# Patient Record
Sex: Female | Born: 1960
Health system: Southern US, Community
[De-identification: ages and names within clinical notes are randomized; demographics above are authoritative.]

## PROBLEM LIST (undated history)

## (undated) DIAGNOSIS — D500 Iron deficiency anemia secondary to blood loss (chronic): Secondary | ICD-10-CM

## (undated) DIAGNOSIS — K219 Gastro-esophageal reflux disease without esophagitis: Secondary | ICD-10-CM

## (undated) DIAGNOSIS — Z9289 Personal history of other medical treatment: Secondary | ICD-10-CM

## (undated) DIAGNOSIS — Z862 Personal history of diseases of the blood and blood-forming organs and certain disorders involving the immune mechanism: Secondary | ICD-10-CM

## (undated) DIAGNOSIS — M1990 Unspecified osteoarthritis, unspecified site: Secondary | ICD-10-CM

## (undated) DIAGNOSIS — R0602 Shortness of breath: Secondary | ICD-10-CM

## (undated) DIAGNOSIS — D649 Anemia, unspecified: Secondary | ICD-10-CM

## (undated) HISTORY — PX: VAGINAL HYSTERECTOMY: SUR661

## (undated) SURGERY — Surgical Case
Anesthesia: *Unknown

---

## 1999-10-22 ENCOUNTER — Encounter: Payer: Self-pay | Admitting: Infectious Diseases

## 1999-10-22 ENCOUNTER — Ambulatory Visit (HOSPITAL_COMMUNITY): Admission: RE | Admit: 1999-10-22 | Discharge: 1999-10-22 | Payer: Self-pay | Admitting: Infectious Diseases

## 2001-12-02 ENCOUNTER — Ambulatory Visit (HOSPITAL_COMMUNITY): Admission: RE | Admit: 2001-12-02 | Discharge: 2001-12-02 | Payer: Self-pay | Admitting: Family Medicine

## 2001-12-02 ENCOUNTER — Encounter: Payer: Self-pay | Admitting: Family Medicine

## 2002-01-22 ENCOUNTER — Emergency Department (HOSPITAL_COMMUNITY): Admission: EM | Admit: 2002-01-22 | Discharge: 2002-01-22 | Payer: Self-pay | Admitting: *Deleted

## 2002-01-22 ENCOUNTER — Encounter: Payer: Self-pay | Admitting: *Deleted

## 2004-02-04 ENCOUNTER — Encounter: Admission: RE | Admit: 2004-02-04 | Discharge: 2004-02-04 | Payer: Self-pay | Admitting: Internal Medicine

## 2004-02-15 ENCOUNTER — Emergency Department (HOSPITAL_COMMUNITY): Admission: EM | Admit: 2004-02-15 | Discharge: 2004-02-15 | Payer: Self-pay | Admitting: Emergency Medicine

## 2004-08-06 HISTORY — PX: CERVICAL CONIZATION W/BX: SHX1330

## 2004-08-21 ENCOUNTER — Encounter (INDEPENDENT_AMBULATORY_CARE_PROVIDER_SITE_OTHER): Payer: Self-pay | Admitting: Specialist

## 2004-08-21 ENCOUNTER — Ambulatory Visit (HOSPITAL_COMMUNITY): Admission: RE | Admit: 2004-08-21 | Discharge: 2004-08-21 | Payer: Self-pay | Admitting: Obstetrics

## 2005-02-16 ENCOUNTER — Encounter: Admission: RE | Admit: 2005-02-16 | Discharge: 2005-02-16 | Payer: Self-pay | Admitting: Internal Medicine

## 2005-02-20 ENCOUNTER — Encounter: Admission: RE | Admit: 2005-02-20 | Discharge: 2005-02-20 | Payer: Self-pay | Admitting: Internal Medicine

## 2005-11-30 ENCOUNTER — Emergency Department (HOSPITAL_COMMUNITY): Admission: EM | Admit: 2005-11-30 | Discharge: 2005-11-30 | Payer: Self-pay | Admitting: *Deleted

## 2006-02-18 ENCOUNTER — Encounter: Admission: RE | Admit: 2006-02-18 | Discharge: 2006-02-18 | Payer: Self-pay | Admitting: Internal Medicine

## 2007-02-24 ENCOUNTER — Encounter: Admission: RE | Admit: 2007-02-24 | Discharge: 2007-02-24 | Payer: Self-pay | Admitting: Internal Medicine

## 2007-08-16 ENCOUNTER — Ambulatory Visit (HOSPITAL_COMMUNITY): Admission: RE | Admit: 2007-08-16 | Discharge: 2007-08-16 | Payer: Self-pay | Admitting: Cardiology

## 2008-02-28 ENCOUNTER — Encounter: Admission: RE | Admit: 2008-02-28 | Discharge: 2008-02-28 | Payer: Self-pay | Admitting: Internal Medicine

## 2008-07-30 ENCOUNTER — Emergency Department (HOSPITAL_COMMUNITY): Admission: EM | Admit: 2008-07-30 | Discharge: 2008-07-30 | Payer: Self-pay | Admitting: Family Medicine

## 2008-09-19 ENCOUNTER — Emergency Department (HOSPITAL_COMMUNITY): Admission: EM | Admit: 2008-09-19 | Discharge: 2008-09-19 | Payer: Self-pay | Admitting: Family Medicine

## 2009-03-07 ENCOUNTER — Encounter: Admission: RE | Admit: 2009-03-07 | Discharge: 2009-03-07 | Payer: Self-pay | Admitting: Internal Medicine

## 2010-02-17 ENCOUNTER — Emergency Department (HOSPITAL_COMMUNITY): Admission: EM | Admit: 2010-02-17 | Discharge: 2010-02-17 | Payer: Self-pay | Admitting: Emergency Medicine

## 2010-02-28 ENCOUNTER — Ambulatory Visit (HOSPITAL_COMMUNITY): Admission: RE | Admit: 2010-02-28 | Discharge: 2010-02-28 | Payer: Self-pay | Admitting: Gastroenterology

## 2010-03-11 ENCOUNTER — Encounter: Admission: RE | Admit: 2010-03-11 | Discharge: 2010-03-11 | Payer: Self-pay | Admitting: Internal Medicine

## 2010-06-29 ENCOUNTER — Encounter: Payer: Self-pay | Admitting: Internal Medicine

## 2010-08-18 ENCOUNTER — Other Ambulatory Visit: Payer: Self-pay | Admitting: Infectious Diseases

## 2010-08-18 ENCOUNTER — Ambulatory Visit (HOSPITAL_COMMUNITY)
Admission: RE | Admit: 2010-08-18 | Discharge: 2010-08-18 | Disposition: A | Discharge status: Home / Self Care | Payer: Self-pay | Source: Ambulatory Visit | Attending: Infectious Diseases | Admitting: Infectious Diseases

## 2010-08-18 DIAGNOSIS — R570 Cardiogenic shock: Secondary | ICD-10-CM | POA: Insufficient documentation

## 2010-08-18 DIAGNOSIS — R7611 Nonspecific reaction to tuberculin skin test without active tuberculosis: Secondary | ICD-10-CM

## 2010-08-21 LAB — POCT RAPID STREP A (OFFICE): Streptococcus, Group A Screen (Direct): NEGATIVE

## 2010-09-17 LAB — POCT RAPID STREP A (OFFICE): Streptococcus, Group A Screen (Direct): NEGATIVE

## 2010-10-24 NOTE — Op Note (Signed)
NAMESYBOL, MORRE              ACCOUNT NO.:  [id number]   MEDICAL RECORD NO.:  [id number]          PATIENT TYPE:  AMB   LOCATION:  SDC                           FACILITY:  [facility]   PHYSICIAN:  Kathreen Cosier, M.D.DATE OF BIRTH:  Mar 19, 1961   DATE OF PROCEDURE:  08/21/2004  DATE OF DISCHARGE:                                 OPERATIVE REPORT   PREOPERATIVE DIAGNOSIS:  Severe dysplasia of the cervix.   POSTOPERATIVE DIAGNOSIS:  Severe dysplasia of the cervix.   PROCEDURE:  Cervical conization.   DESCRIPTION OF PROCEDURE:  Under monitored anesthesia with the patient in  the lithotomy position, the perineum and vagina were prepped and draped.  The bladder was emptied with a straight catheter.   Bimanual exam revealed a normal uterus.  A weighted speculum was placed in  the vagina.  The cervix was stained with Lugol's solution, and there was a  nonstaining area on the posterior lip of the cervix.  A hemostatic suture  was placed at 3 and 9 o'clock on the lateral aspect of the cervix, and then  a cold knife cone done in the usual manner.  Hemostasis was achieved with  sutures placed around the cervix.   The patient tolerated the procedure well.  Blood loss was 100 cc.      BAM/MEDQ  D:  08/21/2004  T:  08/21/2004  Job:  [id number]

## 2011-02-05 ENCOUNTER — Other Ambulatory Visit: Payer: Self-pay | Admitting: Internal Medicine

## 2011-02-05 DIAGNOSIS — Z1231 Encounter for screening mammogram for malignant neoplasm of breast: Secondary | ICD-10-CM

## 2011-03-19 ENCOUNTER — Ambulatory Visit
Admission: RE | Admit: 2011-03-19 | Discharge: 2011-03-19 | Disposition: A | Discharge status: Home / Self Care | Payer: 59 | Source: Ambulatory Visit | Attending: Internal Medicine | Admitting: Internal Medicine

## 2011-03-19 DIAGNOSIS — Z1231 Encounter for screening mammogram for malignant neoplasm of breast: Secondary | ICD-10-CM

## 2011-03-26 ENCOUNTER — Emergency Department (HOSPITAL_COMMUNITY)
Admission: EM | Admit: 2011-03-26 | Discharge: 2011-03-26 | Disposition: A | Discharge status: Home / Self Care | Payer: PRIVATE HEALTH INSURANCE | Attending: Emergency Medicine | Admitting: Emergency Medicine

## 2011-03-26 DIAGNOSIS — T2610XA Burn of cornea and conjunctival sac, unspecified eye, initial encounter: Secondary | ICD-10-CM | POA: Insufficient documentation

## 2011-03-26 DIAGNOSIS — IMO0002 Reserved for concepts with insufficient information to code with codable children: Secondary | ICD-10-CM | POA: Insufficient documentation

## 2011-06-03 ENCOUNTER — Encounter: Payer: Self-pay | Admitting: Emergency Medicine

## 2011-06-03 ENCOUNTER — Emergency Department (INDEPENDENT_AMBULATORY_CARE_PROVIDER_SITE_OTHER): Payer: 59

## 2011-06-03 ENCOUNTER — Emergency Department (HOSPITAL_COMMUNITY)
Admission: EM | Admit: 2011-06-03 | Discharge: 2011-06-03 | Disposition: A | Discharge status: Nursing Home (Includes ICF) | Payer: 59 | Source: Home / Self Care

## 2011-06-03 ENCOUNTER — Encounter (HOSPITAL_COMMUNITY): Payer: Self-pay | Admitting: *Deleted

## 2011-06-03 ENCOUNTER — Encounter (HOSPITAL_COMMUNITY): Payer: Self-pay | Admitting: Physician Assistant

## 2011-06-03 ENCOUNTER — Encounter (HOSPITAL_COMMUNITY): Payer: Self-pay | Admitting: Emergency Medicine

## 2011-06-03 ENCOUNTER — Other Ambulatory Visit: Payer: Self-pay

## 2011-06-03 ENCOUNTER — Inpatient Hospital Stay (HOSPITAL_COMMUNITY)
Admission: EM | Admit: 2011-06-03 | Discharge: 2011-06-06 | DRG: 194 | Disposition: A | Discharge status: Home / Self Care | Payer: 59 | Source: Ambulatory Visit | Attending: Internal Medicine | Admitting: Internal Medicine

## 2011-06-03 DIAGNOSIS — J129 Viral pneumonia, unspecified: Secondary | ICD-10-CM

## 2011-06-03 DIAGNOSIS — J1100 Influenza due to unidentified influenza virus with unspecified type of pneumonia: Principal | ICD-10-CM | POA: Diagnosis present

## 2011-06-03 DIAGNOSIS — J069 Acute upper respiratory infection, unspecified: Secondary | ICD-10-CM

## 2011-06-03 DIAGNOSIS — R0602 Shortness of breath: Secondary | ICD-10-CM

## 2011-06-03 DIAGNOSIS — R509 Fever, unspecified: Secondary | ICD-10-CM

## 2011-06-03 DIAGNOSIS — Z6841 Body Mass Index (BMI) 40.0 and over, adult: Secondary | ICD-10-CM

## 2011-06-03 DIAGNOSIS — E669 Obesity, unspecified: Secondary | ICD-10-CM | POA: Diagnosis present

## 2011-06-03 DIAGNOSIS — R0902 Hypoxemia: Secondary | ICD-10-CM

## 2011-06-03 HISTORY — DX: Shortness of breath: R06.02

## 2011-06-03 LAB — CBC
HCT: 36.6 % (ref 36.0–46.0)
HCT: 37.3 % (ref 36.0–46.0)
Hemoglobin: 11.9 g/dL — ABNORMAL LOW (ref 12.0–15.0)
Hemoglobin: 12.3 g/dL (ref 12.0–15.0)
MCH: 29.8 pg (ref 26.0–34.0)
MCH: 30.2 pg (ref 26.0–34.0)
MCHC: 32.5 g/dL (ref 30.0–36.0)
MCHC: 33 g/dL (ref 30.0–36.0)
MCV: 91.5 fL (ref 78.0–100.0)
MCV: 91.6 fL (ref 78.0–100.0)
Platelets: 184 10*3/uL (ref 150–400)
Platelets: 213 10*3/uL (ref 150–400)
RBC: 4 MIL/uL (ref 3.87–5.11)
RBC: 4.07 MIL/uL (ref 3.87–5.11)
RDW: 13.8 % (ref 11.5–15.5)
RDW: 14 % (ref 11.5–15.5)
WBC: 6.5 10*3/uL (ref 4.0–10.5)
WBC: 6.8 10*3/uL (ref 4.0–10.5)

## 2011-06-03 LAB — CREATININE, SERUM
Creatinine, Ser: 0.67 mg/dL (ref 0.50–1.10)
GFR calc Af Amer: >90 mL/min (ref >90)
GFR calc non Af Amer: >90 mL/min (ref >90)

## 2011-06-03 LAB — POCT I-STAT, CHEM 8
BUN: 11 mg/dL (ref 6–23)
Calcium, Ion: 1.09 mmol/L — ABNORMAL LOW (ref 1.12–1.32)
Chloride: 97 mEq/L (ref 96–112)
Creatinine, Ser: 0.9 mg/dL (ref 0.50–1.10)
Glucose, Bld: 114 mg/dL — ABNORMAL HIGH (ref 70–99)
HCT: 39 % (ref 36.0–46.0)
Hemoglobin: 13.3 g/dL (ref 12.0–15.0)
Potassium: 3.4 mEq/L — ABNORMAL LOW (ref 3.5–5.1)
Sodium: 140 mEq/L (ref 135–145)
TCO2: 32 mmol/L (ref 0–100)

## 2011-06-03 LAB — DIFFERENTIAL
Basophils Absolute: 0 10*3/uL (ref 0.0–0.1)
Basophils Relative: 0 % (ref 0–1)
Eosinophils Absolute: 0 10*3/uL (ref 0.0–0.7)
Eosinophils Relative: 0 % (ref 0–5)
Lymphocytes Relative: 11 % — ABNORMAL LOW (ref 12–46)
Lymphs Abs: 0.7 10*3/uL (ref 0.7–4.0)
Monocytes Absolute: 1.2 10*3/uL — ABNORMAL HIGH (ref 0.1–1.0)
Monocytes Relative: 18 % — ABNORMAL HIGH (ref 3–12)
Neutro Abs: 4.5 10*3/uL (ref 1.7–7.7)
Neutrophils Relative %: 70 % (ref 43–77)

## 2011-06-03 LAB — POCT I-STAT TROPONIN I: Troponin i, poc: 0 ng/mL (ref 0.00–0.08)

## 2011-06-03 MED ORDER — MAGNESIUM SULFATE 40 MG/ML IJ SOLN: 2.0000 g | INTRAMUSCULAR | Status: DC

## 2011-06-03 MED ORDER — ACETAMINOPHEN 325 MG PO TABS
ORAL_TABLET | ORAL | Status: AC
  Filled 2011-06-03: qty 2

## 2011-06-03 MED ORDER — ALBUTEROL SULFATE (5 MG/ML) 0.5% IN NEBU
5.0000 mg | INHALATION_SOLUTION | Freq: Once | RESPIRATORY_TRACT | Status: AC
  Administered 2011-06-03: 5 mg via RESPIRATORY_TRACT

## 2011-06-03 MED ORDER — ACETAMINOPHEN 650 MG RE SUPP: 650.0000 mg | Freq: Four times a day (QID) | RECTAL | Status: DC | PRN

## 2011-06-03 MED ORDER — ALBUTEROL SULFATE (5 MG/ML) 0.5% IN NEBU
5.0000 mg | INHALATION_SOLUTION | Freq: Once | RESPIRATORY_TRACT | Status: AC
  Administered 2011-06-03: 5 mg via RESPIRATORY_TRACT
  Filled 2011-06-03: qty 1

## 2011-06-03 MED ORDER — IPRATROPIUM BROMIDE 0.02 % IN SOLN
0.5000 mg | Freq: Once | RESPIRATORY_TRACT | Status: AC
  Administered 2011-06-03: 0.5 mg via RESPIRATORY_TRACT

## 2011-06-03 MED ORDER — METHYLPREDNISOLONE SODIUM SUCC 125 MG IJ SOLR
125.0000 mg | Freq: Once | INTRAMUSCULAR | Status: AC
  Administered 2011-06-03: 125 mg via INTRAVENOUS
  Filled 2011-06-03: qty 2

## 2011-06-03 MED ORDER — ONDANSETRON HCL 4 MG PO TABS: 4.0000 mg | ORAL_TABLET | Freq: Four times a day (QID) | ORAL | Status: DC | PRN

## 2011-06-03 MED ORDER — ACETAMINOPHEN 325 MG PO TABS: 650.0000 mg | ORAL_TABLET | Freq: Four times a day (QID) | ORAL | Status: DC | PRN

## 2011-06-03 MED ORDER — SODIUM CHLORIDE 0.9 % IV SOLN: 250.0000 mL | INTRAVENOUS | Status: DC | PRN

## 2011-06-03 MED ORDER — MORPHINE SULFATE 2 MG/ML IJ SOLN: 2.0000 mg | INTRAMUSCULAR | Status: DC | PRN

## 2011-06-03 MED ORDER — ALBUTEROL SULFATE (5 MG/ML) 0.5% IN NEBU
INHALATION_SOLUTION | RESPIRATORY_TRACT | Status: AC
  Filled 2011-06-03: qty 1

## 2011-06-03 MED ORDER — ONDANSETRON HCL 4 MG/2ML IJ SOLN
INTRAMUSCULAR | Status: AC
  Filled 2011-06-03: qty 2

## 2011-06-03 MED ORDER — ADULT MULTIVITAMIN W/MINERALS CH
1.0000 | ORAL_TABLET | Freq: Every day | ORAL | Status: DC
  Administered 2011-06-04 – 2011-06-06 (×3): 1 via ORAL
  Filled 2011-06-03 (×3): qty 1

## 2011-06-03 MED ORDER — POLYETHYLENE GLYCOL 3350 17 G PO PACK
17.0000 g | PACK | Freq: Every day | ORAL | Status: DC | PRN
  Filled 2011-06-03: qty 1

## 2011-06-03 MED ORDER — ONDANSETRON HCL 4 MG/2ML IJ SOLN: 4.0000 mg | Freq: Four times a day (QID) | INTRAMUSCULAR | Status: DC | PRN

## 2011-06-03 MED ORDER — SODIUM CHLORIDE 0.9 % IJ SOLN: 3.0000 mL | INTRAMUSCULAR | Status: DC | PRN

## 2011-06-03 MED ORDER — SODIUM CHLORIDE 0.9 % IJ SOLN
3.0000 mL | Freq: Two times a day (BID) | INTRAMUSCULAR | Status: DC
  Administered 2011-06-04 – 2011-06-06 (×4): 3 mL via INTRAVENOUS

## 2011-06-03 MED ORDER — SODIUM CHLORIDE 0.9 % IV SOLN
INTRAVENOUS | Status: DC
  Administered 2011-06-03 (×2): via INTRAVENOUS

## 2011-06-03 MED ORDER — ENOXAPARIN SODIUM 40 MG/0.4ML ~~LOC~~ SOLN
40.0000 mg | SUBCUTANEOUS | Status: DC
  Administered 2011-06-03 – 2011-06-05 (×3): 40 mg via SUBCUTANEOUS
  Filled 2011-06-03 (×4): qty 0.4

## 2011-06-03 MED ORDER — SODIUM CHLORIDE 0.9 % IV BOLUS (SEPSIS)
1000.0000 mL | Freq: Once | INTRAVENOUS | Status: AC
  Administered 2011-06-03: 1000 mL via INTRAVENOUS

## 2011-06-03 MED ORDER — MOXIFLOXACIN HCL IN NACL 400 MG/250ML IV SOLN
400.0000 mg | INTRAVENOUS | Status: DC
  Administered 2011-06-03: 400 mg via INTRAVENOUS
  Filled 2011-06-03 (×2): qty 250

## 2011-06-03 MED ORDER — ALBUTEROL SULFATE (5 MG/ML) 0.5% IN NEBU
2.5000 mg | INHALATION_SOLUTION | Freq: Four times a day (QID) | RESPIRATORY_TRACT | Status: DC
  Administered 2011-06-03: 2.5 mg via RESPIRATORY_TRACT
  Filled 2011-06-03: qty 0.5

## 2011-06-03 MED ORDER — POTASSIUM CHLORIDE CRYS ER 20 MEQ PO TBCR: 20.0000 meq | EXTENDED_RELEASE_TABLET | Freq: Once | ORAL | Status: DC

## 2011-06-03 MED ORDER — BENZONATATE 100 MG PO CAPS
100.0000 mg | ORAL_CAPSULE | Freq: Once | ORAL | Status: AC
  Administered 2011-06-03: 100 mg via ORAL
  Filled 2011-06-03: qty 1

## 2011-06-03 MED ORDER — ACETAMINOPHEN 325 MG PO TABS
650.0000 mg | ORAL_TABLET | Freq: Once | ORAL | Status: AC
  Administered 2011-06-03: 650 mg via ORAL

## 2011-06-03 MED ORDER — POTASSIUM CHLORIDE CRYS ER 20 MEQ PO TBCR
40.0000 meq | EXTENDED_RELEASE_TABLET | Freq: Once | ORAL | Status: AC
  Administered 2011-06-03: 40 meq via ORAL
  Filled 2011-06-03: qty 2

## 2011-06-03 MED ORDER — OXYCODONE HCL 5 MG PO TABS: 5.0000 mg | ORAL_TABLET | ORAL | Status: DC | PRN

## 2011-06-03 MED ORDER — ONDANSETRON HCL 4 MG/2ML IJ SOLN
8.0000 mg | Freq: Once | INTRAMUSCULAR | Status: AC
  Administered 2011-06-03: 8 mg via INTRAVENOUS

## 2011-06-03 MED ORDER — IPRATROPIUM BROMIDE 0.02 % IN SOLN
0.5000 mg | Freq: Once | RESPIRATORY_TRACT | Status: AC
  Administered 2011-06-03: 0.5 mg via RESPIRATORY_TRACT
  Filled 2011-06-03: qty 2.5

## 2011-06-03 MED ORDER — ALBUTEROL SULFATE (5 MG/ML) 0.5% IN NEBU: 2.5000 mg | INHALATION_SOLUTION | RESPIRATORY_TRACT | Status: DC | PRN

## 2011-06-03 NOTE — ED Notes (Signed)
Pt transferred from urgent care for further eval of shortness of breath and low pulse ox.

## 2011-06-03 NOTE — ED Notes (Signed)
Pt here with c/o cough with clear phlegm,chest congestion and discomfort and fever on admit 103.2.pt states sx started on Monday.no n/v/d reported.pt also c/o allergy sx watery eyes,sore throat.no sob or wheezing noted

## 2011-06-03 NOTE — ED Notes (Signed)
3013-01 READY

## 2011-06-03 NOTE — ED Provider Notes (Signed)
History     CSN: [id number]  Arrival date & time 06/03/11  1047   First MD Initiated Contact with Patient 06/03/11 1137      Chief Complaint  Patient presents with  . Respiratory Distress  . Cough    (Consider location/radiation/quality/duration/timing/severity/associated sxs/prior treatment) HPI Comments: Patient transferred to the emergency department from urgent care with diagnosis of viral pneumonitis and hypoxia.  Patient came into the emergency department febrile with O2 saturations in the low 90s.  The patient states she's had upper respiratory type symptoms for the last 3 days including cough, wheezing, congestion, and fever. and she began having difficulty breathing today.  The history is provided by the patient.    History reviewed. No pertinent past medical history.  Past Surgical History  Procedure Date  . Abdominal hysterectomy   . Abdominal hysterectomy     History reviewed. No pertinent family history.  History  Substance Use Topics  . Smoking status: Never Smoker   . Smokeless tobacco: Never Used  . Alcohol Use: Yes     Occassional    OB History    Grav Para Term Preterm Abortions TAB SAB Ect Mult Living                  Review of Systems  Constitutional: Positive for fever, chills and fatigue.  HENT: Positive for congestion, sore throat and rhinorrhea. Negative for facial swelling, drooling, mouth sores, trouble swallowing, neck pain, neck stiffness, dental problem, voice change, tinnitus and ear discharge.   Eyes: Negative for visual disturbance.  Respiratory: Positive for cough, chest tightness and shortness of breath.   Cardiovascular: Positive for chest pain. Negative for palpitations and leg swelling.  Gastrointestinal: Negative for nausea, vomiting and diarrhea.  Genitourinary: Negative for dysuria and urgency.  Musculoskeletal: Negative for back pain and gait problem.  Skin: Negative for pallor and rash.  Neurological: Negative for  syncope, weakness and light-headedness.  All other systems reviewed and are negative.    Allergies  Review of patient's allergies indicates no known allergies.  Home Medications   Current Outpatient Rx  Name Route Sig Dispense Refill  . DIPHENHYDRAMINE HCL 25 MG PO TABS Oral Take 25 mg by mouth every 6 (six) hours as needed. For allergies     . IBUPROFEN 200 MG PO TABS Oral Take 400 mg by mouth daily as needed. For pain     . VITAMIN D (ERGOCALCIFEROL) 50000 UNITS PO CAPS Oral Take 50,000 Units by mouth every 7 (seven) days. Take every Monday       BP 111/54  Pulse 83  Temp(Src) 101.4 F (38.6 C) (Oral)  Resp 20  SpO2 97%  Physical Exam  Nursing note and vitals reviewed. Constitutional: She is oriented to person, place, and time. She appears well-developed and well-nourished. No distress.  HENT:  Head: Normocephalic and atraumatic.  Right Ear: External ear normal.  Left Ear: External ear normal.  Nose: Nose normal.  Mouth/Throat: Oropharynx is clear and moist. No oropharyngeal exudate.  Eyes: EOM are normal.  Neck: Normal range of motion. Neck supple.  Cardiovascular: Normal rate, regular rhythm and normal heart sounds.   Pulmonary/Chest: Effort normal. No accessory muscle usage or stridor. Not tachypneic. No respiratory distress. She has wheezes. She has no rhonchi. She has no rales. She exhibits tenderness.  Abdominal: Soft. She exhibits no distension. There is no tenderness.  Musculoskeletal: Normal range of motion.  Lymphadenopathy:    She has no cervical adenopathy.  Neurological: She is  alert and oriented to person, place, and time.  Skin: Skin is warm and dry. She is not diaphoretic.    ED Course  Procedures (including critical care time)  Labs Reviewed  CBC - Abnormal; Notable for the following:    Hemoglobin 11.9 (*)    All other components within normal limits  DIFFERENTIAL - Abnormal; Notable for the following:    Lymphocytes Relative 11 (*)     Monocytes Relative 18 (*)    Monocytes Absolute 1.2 (*)    All other components within normal limits  POCT I-STAT, CHEM 8 - Abnormal; Notable for the following:    Potassium 3.4 (*)    Glucose, Bld 114 (*)    Calcium, Ion 1.09 (*)    All other components within normal limits  POCT I-STAT TROPONIN I  I-STAT, CHEM 8  I-STAT TROPONIN I   Dg Chest 2 View  06/03/2011  *RADIOLOGY REPORT*  Clinical Data: Cough.  Fever.  CHEST - 2 VIEW  Comparison: 08/18/2010  Findings: There is bronchial thickening.  There is abnormal interstitial density.  No dense consolidation or collapse.  No effusions.  Findings could go along with bronchitis and/or viral pneumonitis.  No evidence of mass.  No significant bony finding. Heart size is normal.  Mediastinal shadows are normal.  IMPRESSION: Bronchial and interstitial prominence suggesting bronchitis and/or viral pneumonitis.  No dense consolidation or collapse.  Original Report Authenticated By: Thomasenia Sales, M.D.     No diagnosis found.  Patient given 125 IV Solu-Medrol, fluids, and a nebulizer treatment while in the emergency department.  Patient is still audibly wheezing on physical exam and is unable to keep her oxygen saturation above 90 when the nasal cannula was removed.  Note that patient is normally non-oxygen at home and is taking 6 L to keep her above 90 while in the ED.  Internal medicine has been paged to admit this patient for the diagnosis of hypoxia and URI Pt has no medical hx in chart and states she has seasonal allergies.  Nurse reassessing vital signs MDM  Hypoxia & bronchitis vs viral pneumonitis.         [place], [place] 06/03/11 [phone]

## 2011-06-03 NOTE — ED Provider Notes (Signed)
Medical screening examination/treatment/procedure(s) were performed by non-physician practitioner and as supervising physician I was immediately available for consultation/collaboration.  Raynald Blend, MD 06/03/11 1253

## 2011-06-03 NOTE — ED Notes (Signed)
1610-96 ready  Johnetta NS notified/msg to Zina

## 2011-06-03 NOTE — H&P (Signed)
PCP:   [name], MD, MD   Chief Complaint:  Cough and shortness of breath  HPI: Patient is a 50 year old African American female with essentially no past medical history who for the past 3 days has been complaining of productive cough with whitish sputum, increasing shortness of breath and dyspnea on exertion, wheezing, subjective fever and sore throat. Her symptoms persisted and so she went to urgent care today. A chest x-ray done noted viral pneumonitis and she was noted to be hypoxic. She's been transferred over to [facility] emergency room where after several breathing treatments and supportive oxygen, her oxygen saturations increased to about 95%, but requiring 6 L. On room air her oxygen saturations were between the 70s to 80s. In the emergency room she was documented to have fevers ranging from 101-103.8.  I saw the patient, she was feeling better. She said her breathing was easier as long she is on supplemental oxygen. She denied any pain other than earlier throughout her chest whenever she coughed hard.  Review of Systems:  The patient denies any headache, vision changes, dysphagia, current chest pain, palpitations, abdominal pain, hematuria, dysuria, constipation, diarrhea, focal steady numbness, weakness or pain. Her only complaint earlier was shortness of breath, productive cough and wheezing although which have improved since she's been in the emergency room. Review systems otherwise negative.  Past Medical History: History reviewed. No pertinent past medical history. Past Surgical History  Procedure Date  . Abdominal hysterectomy   . Abdominal hysterectomy     Medications: Prior to Admission medications   Medication Sig Start Date End Date Taking? Authorizing Provider  diphenhydrAMINE (BENADRYL) 25 MG tablet Take 25 mg by mouth every 6 (six) hours as needed. For allergies    Yes Historical Provider, MD  ibuprofen (ADVIL,MOTRIN) 200 MG tablet Take 400 mg by mouth daily  as needed. For pain    Yes Historical Provider, MD  Vitamin D, Ergocalciferol, (DRISDOL) 50000 UNITS CAPS Take 50,000 Units by mouth every 7 (seven) days. Take every Monday    Yes Historical Provider, MD    Allergies:  No Known Allergies  Social History:  reports that she has never smoked. She has never used smokeless tobacco. She reports that she drinks alcohol-occasional glass of wine, but nothing heavy. She reports that she does not use illicit drugs. She lives at home with her husband and is normally active and able to fully participate in ADLs without any difficulty.  Family History: Have discussed with the patient and she states that the only thing that runs in her family COPD  Physical Exam: Filed Vitals:   06/03/11 1054 06/03/11 1143 06/03/11 1348 06/03/11 1605  BP:  111/54 114/62 121/66  Pulse:  83 84 88  Temp:      TempSrc:      Resp:  20 12   SpO2: 97% 97% 100% 100%   General: Alert and oriented x3, no apparent distress, looks older than stated age, fatigued HEENT: Thomas R., atraumatic, mucous her meds are dry Cardiovascular: Regular rate and rhythm, S1-S2 Lungs: Scattered rales Abdomen: Soft, nontender, nondistended, obese, positive bowel sounds Extremity: No clubbing cyanosis, trace pitting edema   Labs on Admission:   [facility] 06/03/11 1220  NA 140  K 3.4*  CL 97  CO2 --  GLUCOSE 114*  BUN 11  CREATININE 0.90  CALCIUM --  MG --  PHOS --    Basename 06/03/11 1220 06/03/11 1204  WBC -- 6.5  NEUTROABS -- 4.5  HGB 13.3  11.9*  HCT 39.0 36.6  MCV -- 91.5  PLT -- 184    Radiological Exams on Admission: Dg Chest 2 View 06/03/2011    IMPRESSION: Bronchial and interstitial prominence suggesting bronchitis and/or viral pneumonitis.  No dense consolidation or collapse.     Assessment/Plan Present on Admission:  .Hypoxemia: Suspect patient has the flu, and question if some viral pneumonia may be underlying as well. Continue oxygen and nebulizers. Have  started Avelox. Waiting for influenza PCR. Admit to hospital with continuous pulse ox. Medically stable.  .Fever: Likely secondary to flu. Awaiting PCR. Treat subjectively.  .Obesity  Jr Milliron K 06/03/2011, 4:48 PM

## 2011-06-03 NOTE — ED Provider Notes (Signed)
History     CSN: [id number]  Arrival date & time 06/03/11  0803   None     Chief Complaint  Patient presents with  . Influenza  . Cough  . Fever    (Consider location/radiation/quality/duration/timing/severity/associated sxs/prior treatment) HPI Comments: Onset 2 days ago of cough, watery eyes, nausea, sore throat and fever. Pt states her chest hurts from coughing. She denies vomiting or diarrhea. The cough is sometimes productive with clear phlegm. She denies myalgias, dyspnea or wheezing. She has no hx of asthma or COPD. Pt states her 2 sons recently were ill with same symptoms which she thought was due to allergies. She has been taking Advil (last dose 2 days ago), and Benadryl. She also took 1 Amoxicillin which she had from a previous prescription.    History reviewed. No pertinent past medical history.  Past Surgical History  Procedure Date  . Abdominal hysterectomy   . Abdominal hysterectomy     No family history on file.  History  Substance Use Topics  . Smoking status: Never Smoker   . Smokeless tobacco: Never Used  . Alcohol Use: Yes     Occassional    OB History    Grav Para Term Preterm Abortions TAB SAB Ect Mult Living                  Review of Systems  Constitutional: Positive for fever and chills.  HENT: Positive for sore throat. Negative for ear pain, congestion and rhinorrhea.   Respiratory: Positive for cough. Negative for shortness of breath and wheezing.   Cardiovascular: Negative for chest pain.  Gastrointestinal: Positive for nausea. Negative for vomiting, abdominal pain and diarrhea.    Allergies  Review of patient's allergies indicates no known allergies.  Home Medications   Current Outpatient Rx  Name Route Sig Dispense Refill  . DIPHENHYDRAMINE HCL 25 MG PO TABS Oral Take 25 mg by mouth every 6 (six) hours as needed. For allergies     . IBUPROFEN 200 MG PO TABS Oral Take 400 mg by mouth daily as needed. For pain     . VITAMIN D  (ERGOCALCIFEROL) 50000 UNITS PO CAPS Oral Take 50,000 Units by mouth every 7 (seven) days. Take every Monday       BP 115/65  Pulse 90  Temp(Src) 102.2 F (39 C) (Oral)  Resp 17  SpO2 92%  Physical Exam  Nursing note and vitals reviewed. Constitutional: She appears well-developed and well-nourished. No distress.  HENT:  Head: Normocephalic and atraumatic.  Right Ear: Tympanic membrane, external ear and ear canal normal.  Left Ear: Tympanic membrane, external ear and ear canal normal.  Nose: Nose normal.  Mouth/Throat: Uvula is midline, oropharynx is clear and moist and mucous membranes are normal. No oropharyngeal exudate, posterior oropharyngeal edema or posterior oropharyngeal erythema.  Neck: Neck supple.  Cardiovascular: Normal rate, regular rhythm and normal heart sounds.   Pulmonary/Chest: Effort normal and breath sounds normal. No respiratory distress.  Lymphadenopathy:    She has no cervical adenopathy.  Neurological: She is alert.  Skin: Skin is warm and dry.  Psychiatric: She has a normal mood and affect.    ED Course  Procedures (including critical care time)  Labs Reviewed - No data to display Dg Chest 2 View  06/03/2011  *RADIOLOGY REPORT*  Clinical Data: Cough.  Fever.  CHEST - 2 VIEW  Comparison: 08/18/2010  Findings: There is bronchial thickening.  There is abnormal interstitial density.  No dense consolidation or  collapse.  No effusions.  Findings could go along with bronchitis and/or viral pneumonitis.  No evidence of mass.  No significant bony finding. Heart size is normal.  Mediastinal shadows are normal.  IMPRESSION: Bronchial and interstitial prominence suggesting bronchitis and/or viral pneumonitis.  No dense consolidation or collapse.  Original Report Authenticated By: Thomasenia Sales, M.D.     1. Viral pneumonitis   2. Hypoxia       MDM  Pt transferred to [facility]. Hypoxemia - viral pneumonitis on xray and poor resp effort. O2 sats noted to improve  when pt instructed to take deep  Breaths, but then begin to decrease again. No improvement after NMT.         Melody [place]as, [place] 06/03/11 1201

## 2011-06-03 NOTE — ED Notes (Signed)
1610-96 READY

## 2011-06-03 NOTE — ED Notes (Signed)
Pt coming from Urgent care where she went to be treated for a cough and nausea.  At urgent care pt was breathing treatment, tylenol, and zofran. Pt sent from urgent care to be admitted.  ems report that pt desats with any movement and is currently on 6L o2.

## 2011-06-03 NOTE — ED Notes (Signed)
PT POST BREATHING TREATMENT SATS 97% R/A BUT DESATS IN LOW 90'S AT REST.SATS INCREASED AFTER TOLD TO TAKE DEEP BREATHS.TEMP RECHECK 101.8

## 2011-06-04 LAB — INFLUENZA PANEL BY PCR (TYPE A & B)
H1N1 flu by pcr: NOT DETECTED
Influenza A By PCR: POSITIVE — AB
Influenza B By PCR: NEGATIVE

## 2011-06-04 LAB — BASIC METABOLIC PANEL
BUN: 13 mg/dL (ref 6–23)
CO2: 31 mEq/L (ref 19–32)
Calcium: 8.4 mg/dL (ref 8.4–10.5)
Chloride: 103 mEq/L (ref 96–112)
Creatinine, Ser: 0.61 mg/dL (ref 0.50–1.10)
GFR calc Af Amer: >90 mL/min (ref >90)
GFR calc non Af Amer: >90 mL/min (ref >90)
Glucose, Bld: 95 mg/dL (ref 70–99)
Potassium: 3.8 mEq/L (ref 3.5–5.1)
Sodium: 141 mEq/L (ref 135–145)

## 2011-06-04 LAB — CBC
HCT: 36.5 % (ref 36.0–46.0)
Hemoglobin: 11.5 g/dL — ABNORMAL LOW (ref 12.0–15.0)
MCH: 29.3 pg (ref 26.0–34.0)
MCHC: 31.5 g/dL (ref 30.0–36.0)
MCV: 93.1 fL (ref 78.0–100.0)
Platelets: 217 10*3/uL (ref 150–400)
RBC: 3.92 MIL/uL (ref 3.87–5.11)
RDW: 14.2 % (ref 11.5–15.5)
WBC: 6.7 10*3/uL (ref 4.0–10.5)

## 2011-06-04 MED ORDER — OSELTAMIVIR PHOSPHATE 75 MG PO CAPS
75.0000 mg | ORAL_CAPSULE | Freq: Two times a day (BID) | ORAL | Status: DC
  Administered 2011-06-04 – 2011-06-06 (×5): 75 mg via ORAL
  Filled 2011-06-04 (×6): qty 1

## 2011-06-04 MED ORDER — PREDNISONE 50 MG PO TABS
50.0000 mg | ORAL_TABLET | Freq: Every day | ORAL | Status: DC
  Administered 2011-06-05 – 2011-06-06 (×2): 50 mg via ORAL
  Filled 2011-06-04 (×3): qty 1

## 2011-06-04 MED ORDER — ALBUTEROL SULFATE HFA 108 (90 BASE) MCG/ACT IN AERS
1.0000 | INHALATION_SPRAY | RESPIRATORY_TRACT | Status: DC | PRN
  Filled 2011-06-04: qty 6.7

## 2011-06-04 MED ORDER — ALBUTEROL SULFATE (5 MG/ML) 0.5% IN NEBU
2.5000 mg | INHALATION_SOLUTION | Freq: Three times a day (TID) | RESPIRATORY_TRACT | Status: DC
  Administered 2011-06-04: 2.5 mg via RESPIRATORY_TRACT

## 2011-06-04 NOTE — Progress Notes (Signed)
Pt admitted for cough and fever x 1 week. Pt repts that she was SOB but SOB is now "gone". Pt repts chest "discomfort" when she coughs that radiates from chest to stomach that is unchanged over the week that she has been sick. Pt lungs noted to be clear in the upper lobes with diminshed areas in the bases and fine crackles of the lower posterior bases. Pt on continuous pulse ox. Pt sats 93% 1 lpm. Pt noted to have an oxy sat level of 85% while bathing this AM on RA. Pt oxygen then decreased to 1 lpm and pt level has stayed 90-93%. Pt currently c/o now resp distress or cardiac distress and no s/sx of such. Pt repts nonprod cough now that was once productive. Pt repts that she feels "better" today. Pt repts decreased appetite since she has been sick and LBM 12/24. Pt denies nausea or vomiting. Pt repts voiding quantity sufficient without difficulty. Pt repts that her throat remains "sore".

## 2011-06-04 NOTE — ED Provider Notes (Signed)
Medical screening examination/treatment/procedure(s) were performed by non-physician practitioner and as supervising physician I was immediately available for consultation/collaboration.  Geoffery Lyons, MD 06/04/11 [phone]

## 2011-06-04 NOTE — Progress Notes (Signed)
Subjective/24 Hour Events: I have reviewed the admission H&P and all data in between. Patient reports feeling slightly better. Per nurse, still requiring O2 as evidenced by fact that her sats dropped to 88% off of O2 while trying to wash up. Evaluation positive for Influenza A. Last fever at 5pm yesterday.   Filed Vitals:   06/04/11 0700  BP: 121/57  Pulse: 83  Temp: 98.8 F (37.1 C)  Resp: 18     Exam: General: Alert and oriented x 3, no apparent distress Lungs: Ronchi bilateral lower lobes Heart: Regular rate and rhythm, III/VI holosystolic murmur. No rubs, gallops, or murmurs Abdomen: Soft, nontender, nondistended, positive bowel sounds, no masses Extremities: No lower extremity edema.  Labs: CBC    Component Value Date/Time   WBC 6.7 06/04/2011 0615   RBC 3.92 06/04/2011 0615   HGB 11.5* 06/04/2011 0615   HCT 36.5 06/04/2011 0615   PLT 217 06/04/2011 0615   MCV 93.1 06/04/2011 0615   MCH 29.3 06/04/2011 0615   MCHC 31.5 06/04/2011 0615   RDW 14.2 06/04/2011 0615   LYMPHSABS 0.7 06/03/2011 1204   MONOABS 1.2* 06/03/2011 1204   EOSABS 0.0 06/03/2011 1204   BASOSABS 0.0 06/03/2011 1204    BMET    Component Value Date/Time   NA 141 06/04/2011 0615   K 3.8 06/04/2011 0615   CL 103 06/04/2011 0615   CO2 31 06/04/2011 0615   GLUCOSE 95 06/04/2011 0615   BUN 13 06/04/2011 0615   CREATININE 0.61 06/04/2011 0615   CALCIUM 8.4 06/04/2011 0615   GFRNONAA >90 06/04/2011 0615   GFRAA >90 06/04/2011 0615   Flu PCR positive for Influenza A  Medications: Reviewed in [facility]   Impression/Plan: 50 year old otherwise healthy woman who presents for evaluation of shortness of breath, cough, and infectious symptoms found to be positive for influenza pneumonia. Patient is currently afebrile and hemodynamically stable although still requiring O2 with exertion.  Influenza: Discontinue Avelox Start Tamiflu Start high-dose prednisone burst with prednisone 50 mg by mouth  daily Albuterol inhaler as needed Follow-up blood cultures Attempt to wean off oxygen with the above  Fluid/Nutrition: - Hep lock IV - General diet  Prophylaxis: - Lovenox

## 2011-06-05 NOTE — Progress Notes (Signed)
Chaplain's Note:  Pt invited me into her room.  I provided emotional and spiritual support.  Pt is ready to feel better and go home.  We ended the visit with prayer.  Please page me if needed or requested.  06/05/11 1157  Clinical Encounter Type  Visited With Patient  Visit Type Spiritual support  Referral From Nurse  Spiritual Encounters  Spiritual Needs Prayer;Emotional  Stress Factors  Patient Stress Factors Family relationships;Health changes   [facility]  [phone]

## 2011-06-05 NOTE — Plan of Care (Signed)
Problem: Phase II Progression Outcomes Goal: Other Phase II Outcomes/Goals Outcome: Progressing Weaning 02 5l to 2l

## 2011-06-05 NOTE — Progress Notes (Signed)
Patient ID: Emily Holland, female   DOB: 04-25-1961, 50 y.o.   MRN: [id number] Subjective: No events overnight. Patient denies chest pain, shortness of breath, abdominal pain.   Objective:  Vital signs in last 24 hours:  Filed Vitals:   06/04/11 0700 06/04/11 2234 06/05/11 0628 06/05/11 0900  BP: 121/57 134/68 134/51   Pulse: 83 72 75   Temp: 98.8 F (37.1 C) 99.2 F (37.3 C) 98.3 F (36.8 C)   TempSrc:      Resp: 18 18 18    Height:    5\' 7"  (1.702 m)  Weight:    117.935 kg (260 lb)  SpO2: 95% 96% 98%     Intake/Output from previous day:   Intake/Output Summary (Last 24 hours) at 06/05/11 1725 Last data filed at 06/05/11 0655  Gross per 24 hour  Intake    600 ml  Output      0 ml  Net    600 ml    Physical Exam: General: Alert, awake, oriented x3, in no acute distress. HEENT: No bruits, no goiter. Moist mucous membranes, no scleral icterus, no conjunctival pallor. Heart: Regular rate and rhythm, S1/S2 +, no murmurs, rubs, gallops. Lungs: Clear to auscultation bilaterally. No wheezing, no rhonchi, no rales.  Abdomen: Soft, nontender, nondistended, positive bowel sounds. Extremities: No clubbing or cyanosis, no pitting edema,  positive pedal pulses. Neuro: Grossly nonfocal.  Lab Results:  Basic Metabolic Panel:    Component Value Date/Time   NA 141 06/04/2011 0615   K 3.8 06/04/2011 0615   CL 103 06/04/2011 0615   CO2 31 06/04/2011 0615   BUN 13 06/04/2011 0615   CREATININE 0.61 06/04/2011 0615   GLUCOSE 95 06/04/2011 0615   CALCIUM 8.4 06/04/2011 0615   CBC:    Component Value Date/Time   WBC 6.7 06/04/2011 0615   HGB 11.5* 06/04/2011 0615   HCT 36.5 06/04/2011 0615   PLT 217 06/04/2011 0615   MCV 93.1 06/04/2011 0615   NEUTROABS 4.5 06/03/2011 1204   LYMPHSABS 0.7 06/03/2011 1204   MONOABS 1.2* 06/03/2011 1204   EOSABS 0.0 06/03/2011 1204   BASOSABS 0.0 06/03/2011 1204      Lab 06/04/11 0615 06/03/11 2038 06/03/11 1220 06/03/11 1204  WBC  6.7 6.8 -- 6.5  HGB 11.5* 12.3 13.3 11.9*  HCT 36.5 37.3 39.0 36.6  PLT 217 213 -- 184  MCV 93.1 91.6 -- 91.5  MCH 29.3 30.2 -- 29.8  MCHC 31.5 33.0 -- 32.5  RDW 14.2 14.0 -- 13.8  LYMPHSABS -- -- -- 0.7  MONOABS -- -- -- 1.2*  EOSABS -- -- -- 0.0  BASOSABS -- -- -- 0.0  BANDABS -- -- -- --    Lab 06/04/11 0615 06/03/11 2038 06/03/11 1220  NA 141 -- 140  K 3.8 -- 3.4*  CL 103 -- 97  CO2 31 -- --  GLUCOSE 95 -- 114*  BUN 13 -- 11  CREATININE 0.61 0.67 0.90  CALCIUM 8.4 -- --  MG -- -- --   No results found for this basename: INR:5,PROTIME:5 in the last 168 hours Cardiac markers: No results found for this basename: CK:3,CKMB:3,TROPONINI:3,MYOGLOBIN:3 in the last 168 hours No components found with this basename: POCBNP:3 Recent Results (from the past 240 hour(s))  CULTURE, BLOOD (ROUTINE X 2)     Status: Normal (Preliminary result)   Collection Time   06/03/11  5:14 PM      Component Value Range Status Comment   Specimen Description BLOOD ARM RIGHT  Final    Special Requests BOTTLES DRAWN AEROBIC AND ANAEROBIC 10CC   Final    Setup Time [id number]   Final    Culture     Final    Value:        BLOOD CULTURE RECEIVED NO GROWTH TO DATE CULTURE WILL BE HELD FOR 5 DAYS BEFORE ISSUING A FINAL NEGATIVE REPORT   Report Status PENDING   Incomplete   CULTURE, BLOOD (ROUTINE X 2)     Status: Normal (Preliminary result)   Collection Time   06/03/11  5:19 PM      Component Value Range Status Comment   Specimen Description BLOOD FOREARM RIGHT   Final    Special Requests BOTTLES DRAWN AEROBIC AND ANAEROBIC 10CC   Final    Setup Time [id number]   Final    Culture     Final    Value:        BLOOD CULTURE RECEIVED NO GROWTH TO DATE CULTURE WILL BE HELD FOR 5 DAYS BEFORE ISSUING A FINAL NEGATIVE REPORT   Report Status PENDING   Incomplete     Studies/Results: No results found.  Medications: Scheduled Meds:   . enoxaparin  40 mg Subcutaneous Q24H  . mulitivitamin with  minerals  1 tablet Oral Daily  . oseltamivir  75 mg Oral BID  . predniSONE  50 mg Oral Q breakfast  . sodium chloride  3 mL Intravenous Q12H   Continuous Infusions:  PRN Meds:.sodium chloride, acetaminophen, acetaminophen, albuterol, albuterol, morphine, ondansetron (ZOFRAN) IV, ondansetron, oxyCODONE, polyethylene glycol, sodium chloride  Assessment/Plan:  Principal Problem:   *Hypoxemia - saturating 98% on room air - likely secondary to influenza pneumonia - resolved - blood cultures to date negative  Active Problems:   Fever - secondary to influenza pneumonia - continue tamiflu   EDUCATION - test results and diagnostic studies were discussed with patient and pt's family who was present at the bedside - patient and family have verbalized the understanding - questions were answered at the bedside and contact information was provided for additional questions or concerns   LOS: 2 days   Emily Holland 06/05/2011, 5:25 PM  [facility] Pager: [phone]

## 2011-06-06 MED ORDER — WHITE PETROLATUM GEL
Status: AC
  Administered 2011-06-06: 12:00:00
  Filled 2011-06-06: qty 5

## 2011-06-06 MED ORDER — ALBUTEROL SULFATE HFA 108 (90 BASE) MCG/ACT IN AERS: 1.0000 | INHALATION_SPRAY | RESPIRATORY_TRACT | Status: DC | PRN

## 2011-06-06 MED ORDER — PREDNISONE 50 MG PO TABS: ORAL_TABLET | ORAL | Status: AC

## 2011-06-06 MED ORDER — OXYCODONE HCL 5 MG PO TABS: 5.0000 mg | ORAL_TABLET | ORAL | Status: AC | PRN

## 2011-06-06 MED ORDER — OSELTAMIVIR PHOSPHATE 75 MG PO CAPS: 75.0000 mg | ORAL_CAPSULE | Freq: Two times a day (BID) | ORAL | Status: AC

## 2011-06-06 NOTE — Discharge Summary (Signed)
Patient ID: Emily Holland MRN: [id number] DOB/AGE: 09-12-1960 50 y.o.  Admit date: 06/03/2011 Discharge date: 06/06/2011  Primary Care Physician:  [name], MD, MD  Discharge Diagnoses:    Present on Admission:  .Hypoxemia .Fever .Obesity  Principal Problem:  *Hypoxemia Active Problems:  Fever  Obesity   Current Discharge Medication List    START taking these medications   Details  albuterol (PROVENTIL HFA;VENTOLIN HFA) 108 (90 BASE) MCG/ACT inhaler Inhale 1-2 puffs into the lungs every 4 (four) hours as needed for wheezing or shortness of breath. Qty: 1 Inhaler, Refills: 0    oseltamivir (TAMIFLU) 75 MG capsule Take 1 capsule (75 mg total) by mouth 2 (two) times daily. Qty: 20 capsule, Refills: 0    oxyCODONE (OXY IR/ROXICODONE) 5 MG immediate release tablet Take 1 tablet (5 mg total) by mouth every 4 (four) hours as needed. Qty: 30 tablet, Refills: 0    predniSONE (DELTASONE) 50 MG tablet Please taper by 10 mg a day to 0 mg Qty: 5 tablet, Refills: 0      CONTINUE these medications which have NOT CHANGED   Details  diphenhydrAMINE (BENADRYL) 25 MG tablet Take 25 mg by mouth every 6 (six) hours as needed. For allergies     ibuprofen (ADVIL,MOTRIN) 200 MG tablet Take 400 mg by mouth daily as needed. For pain     Vitamin D, Ergocalciferol, (DRISDOL) 50000 UNITS CAPS Take 50,000 Units by mouth every 7 (seven) days. Take every Monday         Disposition and Follow-up: with PCP in 1 week  Consults:  none  Significant Diagnostic Studies:  No results found.  Brief H and P: Patient is a 50 year old African American female with essentially no past medical history who for the past 3 days prior to admission has been complaining of productive cough with whitish sputum, increasing shortness of breath and dyspnea on exertion, wheezing, subjective fever and sore throat. Her symptoms persisted and so she went to urgent care. A chest x-ray done noted viral  pneumonitis and she was noted to be hypoxic. She's been transferred over to [facility] [address] emergency room where after several breathing treatments and supportive oxygen, her oxygen saturations increased to about 95%, but requiring 6 L.    Physical Exam on Discharge:  Filed Vitals:   06/05/11 0900 06/05/11 1400 06/05/11 2226 06/06/11 0606  BP:  100/49 115/64 115/73  Pulse:  73 82 67  Temp:  99.5 F (37.5 C) 98.2 F (36.8 C) 98.3 F (36.8 C)  TempSrc:      Resp:  18 20 19   Height: 5\' 7"  (1.702 m)     Weight: 117.935 kg (260 lb)     SpO2:  93% 93% 94%     Intake/Output Summary (Last 24 hours) at 06/06/11 1224 Last data filed at 06/06/11 1034  Gross per 24 hour  Intake      3 ml  Output      0 ml  Net      3 ml    General: Alert, awake, oriented x3, in no acute distress. HEENT: No bruits, no goiter. Heart: Regular rate and rhythm, without murmurs, rubs, gallops. Lungs: Clear to auscultation bilaterally. Abdomen: Soft, nontender, nondistended, positive bowel sounds. Extremities: No clubbing cyanosis or edema with positive pedal pulses. Neuro: Grossly intact, nonfocal.  CBC:    Component Value Date/Time   WBC 6.7 06/04/2011 0615   HGB 11.5* 06/04/2011 0615   HCT 36.5 06/04/2011 0615   PLT 217  06/04/2011 0615   MCV 93.1 06/04/2011 0615   NEUTROABS 4.5 06/03/2011 1204   LYMPHSABS 0.7 06/03/2011 1204   MONOABS 1.2* 06/03/2011 1204   EOSABS 0.0 06/03/2011 1204   BASOSABS 0.0 06/03/2011 1204    Basic Metabolic Panel:    Component Value Date/Time   NA 141 06/04/2011 0615   K 3.8 06/04/2011 0615   CL 103 06/04/2011 0615   CO2 31 06/04/2011 0615   BUN 13 06/04/2011 0615   CREATININE 0.61 06/04/2011 0615   GLUCOSE 95 06/04/2011 0615   CALCIUM 8.4 06/04/2011 0615   Assessment/Plan:   Principal Problem:   *Hypoxemia  - saturating 98% on room air  - likely secondary to influenza pneumonia  - resolved  - blood cultures to date negative  - Patient can continue taking  Tamiflu on discharge for 10 more days  Active Problems:   Fever  - secondary to influenza pneumonia  - continue tamiflu  - Resolved, afebrile for more than 24 hours  EDUCATION  - test results and diagnostic studies were discussed with patient and pt's family who was present at the bedside  - patient and family have verbalized the understanding  - questions were answered at the bedside and contact information was provided for additional questions or concerns  Disposition - Patient is medically stable and clinically appears well to be discharged home   Time spent on Discharge: Greater than 30 minutes  Signed: Allea Kassner 06/06/2011, 12:24 PM

## 2011-06-10 LAB — CULTURE, BLOOD (ROUTINE X 2)
Culture  Setup Time: 201212270156
Culture  Setup Time: 201212270156
Culture: NO GROWTH
Culture: NO GROWTH

## 2012-02-16 ENCOUNTER — Other Ambulatory Visit: Payer: Self-pay | Admitting: Internal Medicine

## 2012-02-16 DIAGNOSIS — Z1231 Encounter for screening mammogram for malignant neoplasm of breast: Secondary | ICD-10-CM

## 2012-03-08 ENCOUNTER — Encounter (HOSPITAL_COMMUNITY): Payer: Self-pay | Admitting: Emergency Medicine

## 2012-03-08 ENCOUNTER — Emergency Department (INDEPENDENT_AMBULATORY_CARE_PROVIDER_SITE_OTHER): Payer: 59

## 2012-03-08 ENCOUNTER — Emergency Department (HOSPITAL_COMMUNITY)
Admission: EM | Admit: 2012-03-08 | Discharge: 2012-03-08 | Disposition: A | Discharge status: Home / Self Care | Payer: 59 | Source: Home / Self Care | Attending: Family Medicine | Admitting: Family Medicine

## 2012-03-08 DIAGNOSIS — M25579 Pain in unspecified ankle and joints of unspecified foot: Secondary | ICD-10-CM

## 2012-03-08 DIAGNOSIS — G8929 Other chronic pain: Secondary | ICD-10-CM

## 2012-03-08 MED ORDER — DICLOFENAC POTASSIUM 50 MG PO TABS: 50.0000 mg | ORAL_TABLET | Freq: Two times a day (BID) | ORAL | Status: DC

## 2012-03-08 NOTE — ED Notes (Signed)
Pt c/o right foot pain x3 weeks... Hx of gout... Sx include: swelling, pain when walking... Denies: fevers, vomiting, nausea, diarrhea... 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 Then at night the swelling goes down.

## 2012-03-08 NOTE — ED Provider Notes (Signed)
History     CSN: [id number]  Arrival date & time 03/08/12  1018   First MD Initiated Contact with Patient 03/08/12 1054      Chief Complaint  Patient presents with  . Foot Pain    (Consider location/radiation/quality/duration/timing/severity/associated sxs/prior treatment) Patient is a 51 y.o. female presenting with lower extremity pain. The history is provided by the patient.  Foot Pain This is a new problem. The current episode started more than 1 week ago (sx over 2 weeks ago.). The problem has been gradually worsening. The symptoms are aggravated by bending.    Past Medical History  Diagnosis Date  . Gout   . No pertinent past medical history   . Shortness of breath on exertion 06/03/11    Past Surgical History  Procedure Date  . Vaginal hysterectomy ~ 2005  . Cervical conization w/bx 08/2004    No family history on file.  History  Substance Use Topics  . Smoking status: Never Smoker   . Smokeless tobacco: Never Used  . Alcohol Use: Yes     Occassional    OB History    Grav Para Term Preterm Abortions TAB SAB Ect Mult Living                  Review of Systems  Constitutional: Negative.   Musculoskeletal: Positive for joint swelling, arthralgias and gait problem. Negative for back pain.  Skin: Negative.     Allergies  Review of patient's allergies indicates no known allergies.  Home Medications   Current Outpatient Rx  Name Route Sig Dispense Refill  . ALBUTEROL SULFATE HFA 108 (90 BASE) MCG/ACT IN AERS Inhalation Inhale 1-2 puffs into the lungs every 4 (four) hours as needed for wheezing or shortness of breath. 1 Inhaler 0  . DICLOFENAC POTASSIUM 50 MG PO TABS Oral Take 1 tablet (50 mg total) by mouth 2 (two) times daily. 30 tablet 0  . DIPHENHYDRAMINE HCL 25 MG PO TABS Oral Take 25 mg by mouth every 6 (six) hours as needed. For allergies     . IBUPROFEN 200 MG PO TABS Oral Take 400 mg by mouth daily as needed. For pain     . VITAMIN D  (ERGOCALCIFEROL) 50000 UNITS PO CAPS Oral Take 50,000 Units by mouth every 7 (seven) days. Take every Monday       BP 126/62  Pulse 61  Temp 97.8 F (36.6 C) (Oral)  Resp 18  SpO2 100%  Physical Exam  Nursing note and vitals reviewed. Constitutional: She is oriented to person, place, and time. She appears well-developed and well-nourished.  Musculoskeletal: She exhibits tenderness. She exhibits no edema.       Feet:  Neurological: She is alert and oriented to person, place, and time.  Skin: Skin is warm and dry.    ED Course  Procedures (including critical care time)  Labs Reviewed - No data to display Dg Ankle Complete Right  03/08/2012  *RADIOLOGY REPORT*  Clinical Data: Pain and swelling for 3 weeks, no known injury  RIGHT ANKLE - COMPLETE 3+ VIEW  Comparison: None.  Findings: Four views of the right knee submitted.  No acute fracture or subluxation.  Significant soft tissue swelling adjacent to lateral and medial malleolus.  Small posterior spur of the calcaneus.  Ankle mortise is preserved.  IMPRESSION: No acute fracture or subluxation.  Significant soft tissue swelling adjacent to medial and lateral malleolus.   Original Report Authenticated By: Natasha Mead, M.D.  1. Chronic ankle pain       MDM  X-rays reviewed and report per radiologist.         Linna Hoff, MD 03/08/12 1146

## 2012-03-22 ENCOUNTER — Ambulatory Visit
Admission: RE | Admit: 2012-03-22 | Discharge: 2012-03-22 | Disposition: A | Discharge status: Home / Self Care | Payer: 59 | Source: Ambulatory Visit | Attending: Internal Medicine | Admitting: Internal Medicine

## 2012-03-22 DIAGNOSIS — Z1231 Encounter for screening mammogram for malignant neoplasm of breast: Secondary | ICD-10-CM

## 2012-11-24 IMAGING — CR DG CHEST 2V
2 series · 2 of 2 positions shown · non-contrast
Comparison: 08/18/2010

CLINICAL DATA: Cough.  Fever.

CHEST - 2 VIEW

[view not recorded (1 of 2)]
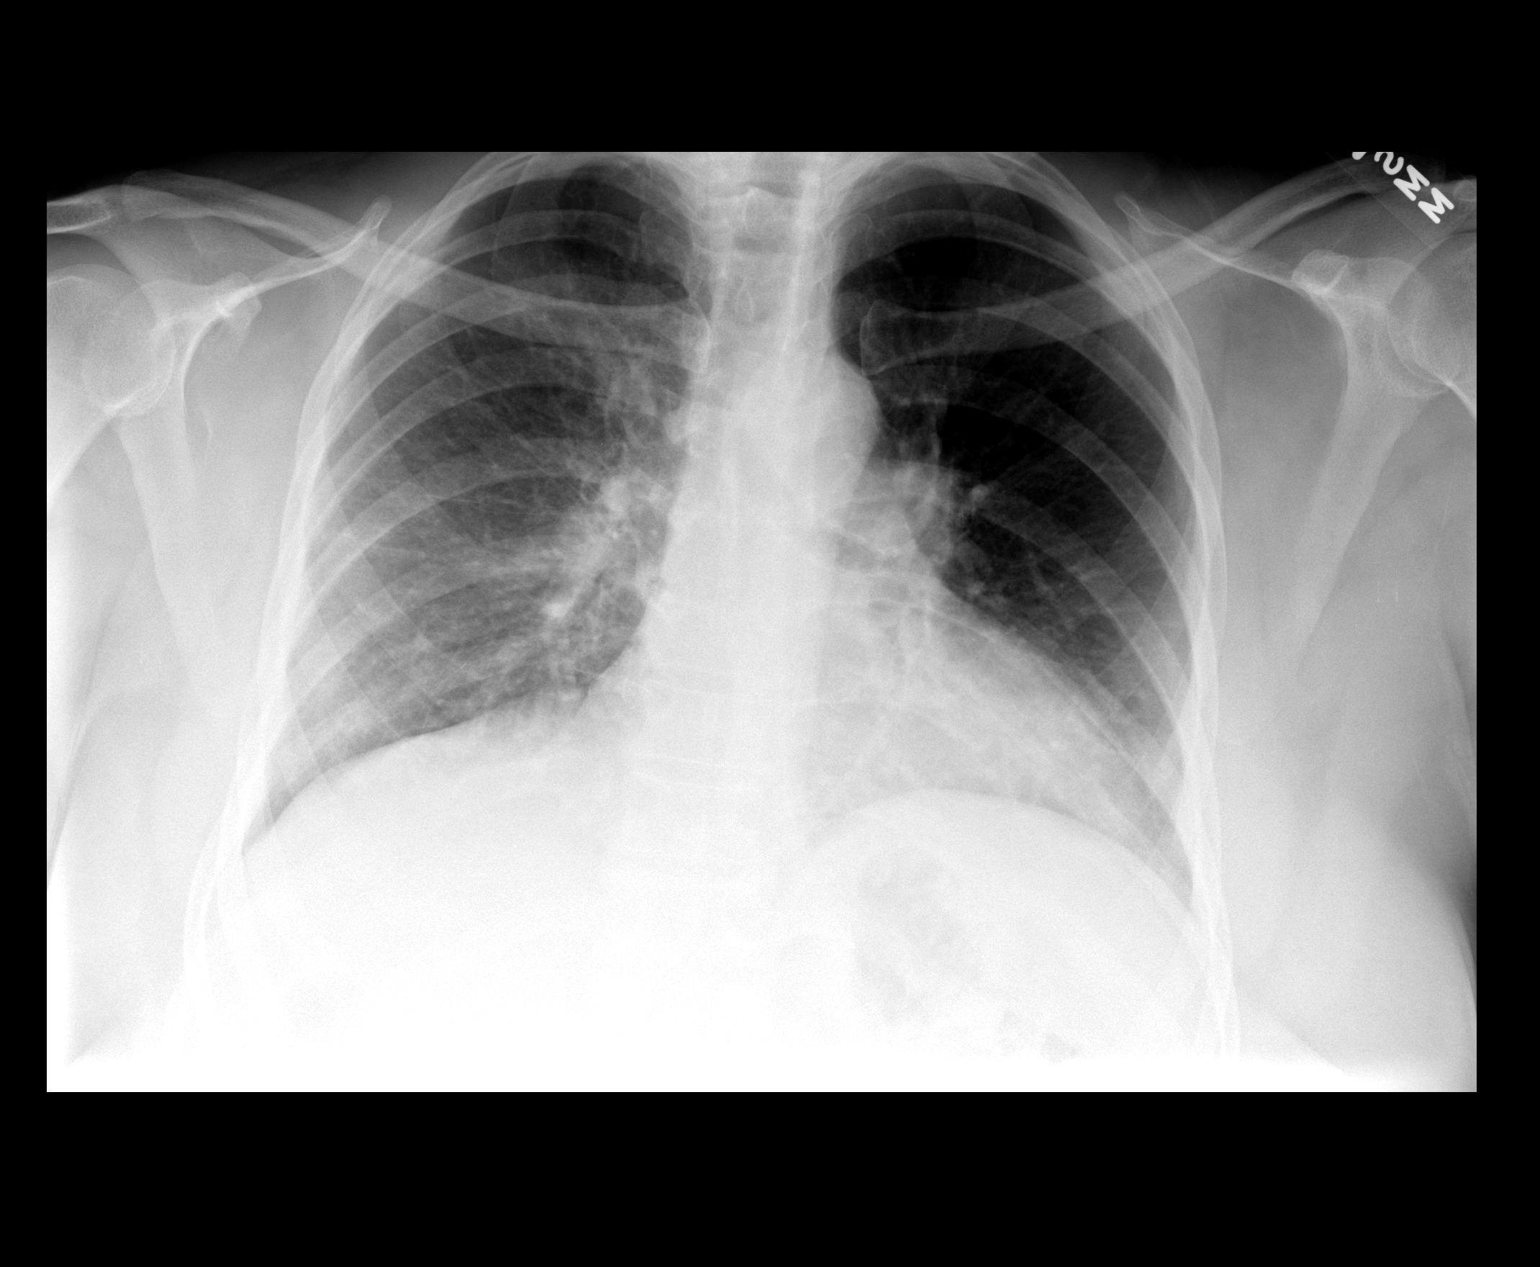

[view not recorded (2 of 2)]
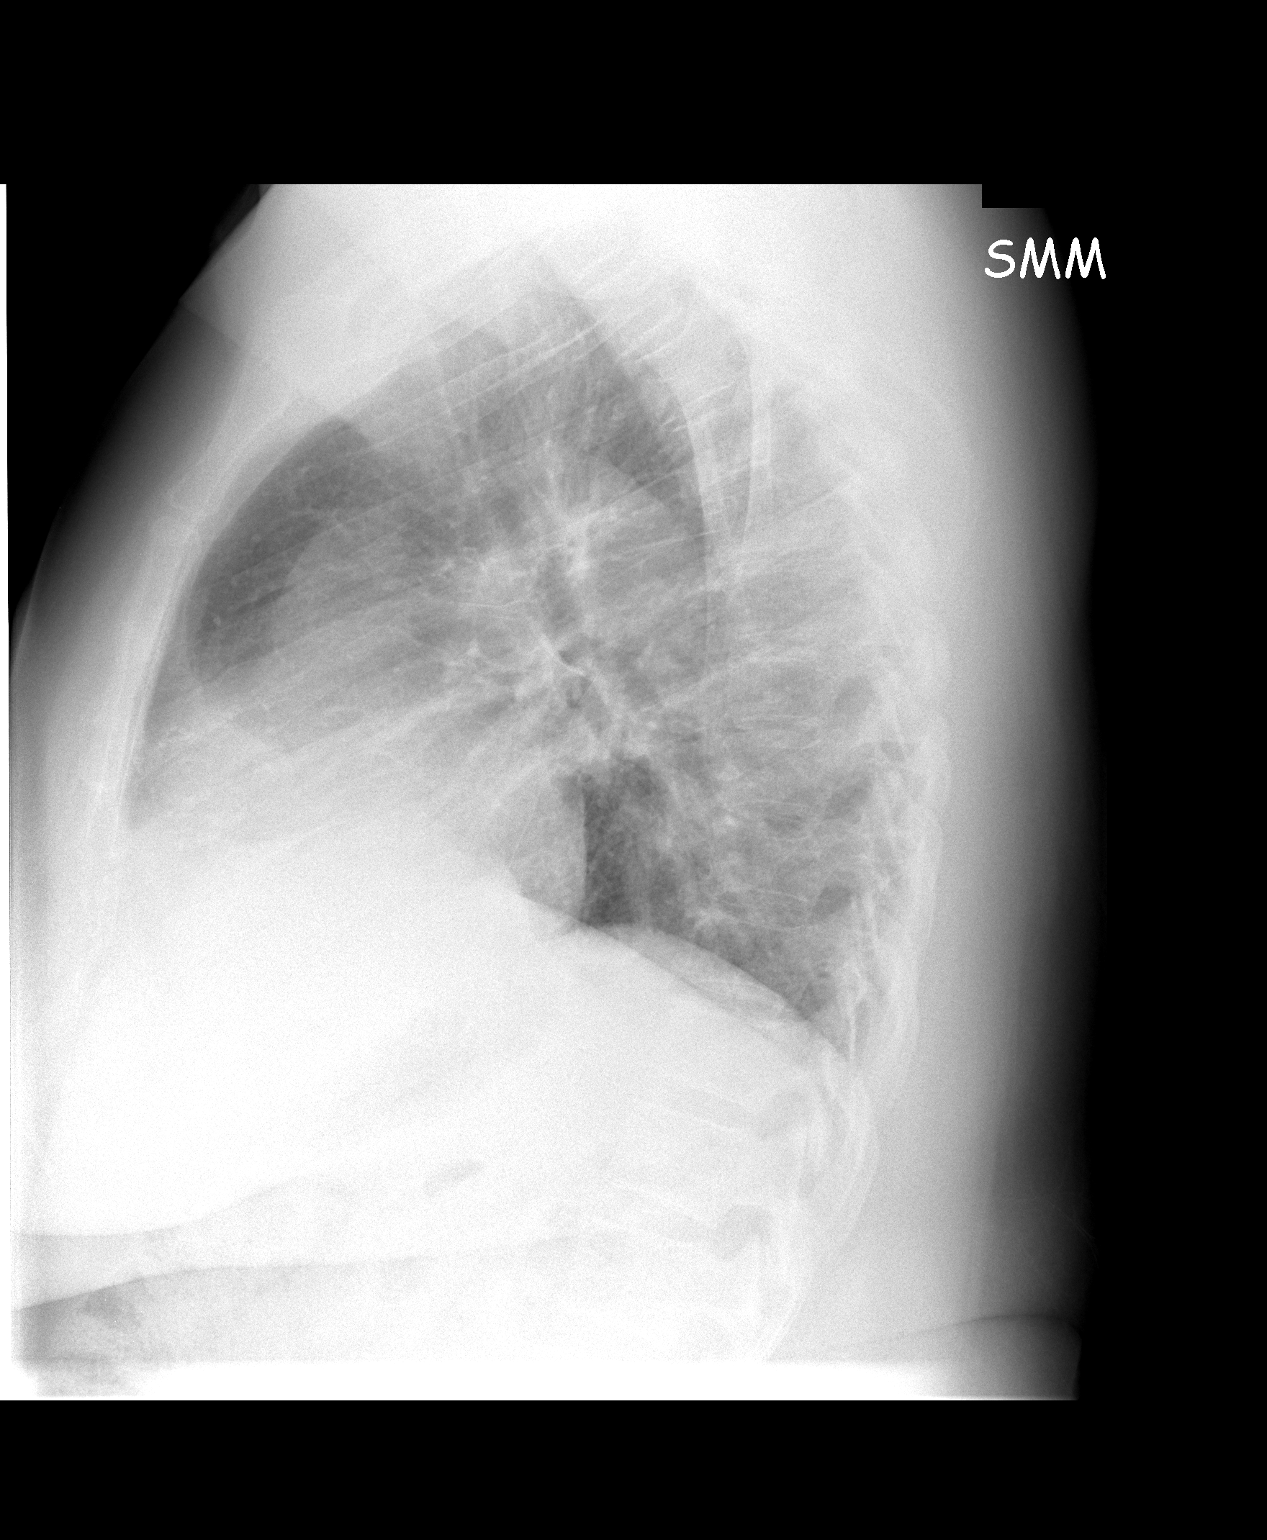

[2 of 2 positions shown; findings below may reference images not displayed]

FINDINGS: There is bronchial thickening.  There is abnormal
interstitial density.  No dense consolidation or collapse.  No
effusions.  Findings could go along with bronchitis and/or viral
pneumonitis.  No evidence of mass.  No significant bony finding.
Heart size is normal.  Mediastinal shadows are normal.
IMPRESSION: Bronchial and interstitial prominence suggesting bronchitis and/or
viral pneumonitis.  No dense consolidation or collapse.

## 2013-02-14 ENCOUNTER — Other Ambulatory Visit: Payer: Self-pay

## 2013-02-14 DIAGNOSIS — Z1231 Encounter for screening mammogram for malignant neoplasm of breast: Secondary | ICD-10-CM

## 2013-03-28 ENCOUNTER — Ambulatory Visit
Admission: RE | Admit: 2013-03-28 | Discharge: 2013-03-28 | Disposition: A | Discharge status: Home / Self Care | Payer: 59 | Source: Ambulatory Visit

## 2013-03-28 DIAGNOSIS — Z1231 Encounter for screening mammogram for malignant neoplasm of breast: Secondary | ICD-10-CM

## 2014-02-20 ENCOUNTER — Other Ambulatory Visit: Payer: Self-pay

## 2014-02-20 DIAGNOSIS — Z1231 Encounter for screening mammogram for malignant neoplasm of breast: Secondary | ICD-10-CM

## 2014-02-27 ENCOUNTER — Encounter: Payer: 59 | Attending: Orthopedic Surgery | Admitting: Dietician

## 2014-02-27 ENCOUNTER — Encounter: Payer: Self-pay | Admitting: Dietician

## 2014-02-27 VITALS — Ht 66.0 in | Wt 275.2 lb

## 2014-02-27 DIAGNOSIS — Z6841 Body Mass Index (BMI) 40.0 and over, adult: Secondary | ICD-10-CM | POA: Insufficient documentation

## 2014-02-27 DIAGNOSIS — E669 Obesity, unspecified: Secondary | ICD-10-CM | POA: Diagnosis not present

## 2014-02-27 DIAGNOSIS — M109 Gout, unspecified: Secondary | ICD-10-CM | POA: Diagnosis not present

## 2014-02-27 DIAGNOSIS — M129 Arthropathy, unspecified: Secondary | ICD-10-CM | POA: Diagnosis not present

## 2014-02-27 DIAGNOSIS — Z713 Dietary counseling and surveillance: Secondary | ICD-10-CM | POA: Diagnosis not present

## 2014-02-27 NOTE — Patient Instructions (Signed)
Avoid eating meat in large quantities (organ meat, [place], sardines, game meat). Drink plenty of water especially when having gout pain. Eat oatmeal and beans in moderation.  Have protein with starch/fruit at each meal or snack. (nuts, cheese, yogurt, eggs, cottage cheese, peanut butter). Try to eat something every 3-5 hours you are awake. Add more vegetables to lunch and dinner. Limit rice (starch) and meat to a quarter of your plate. Use your exercise machine at home 2 x week for 15-20 minutes and increase you when can.

## 2014-02-27 NOTE — Progress Notes (Signed)
  Medical Nutrition Therapy:  Appt start time: 0800 end time:  0850.  Assessment:  Primary concerns today: Emily Holland is here today since she is trying to lose weight. Has gout and arthritis. Weight is causing stress on hips and knees and doctor said she will require a hip replacement in the future. Has gout pain in feet and knee that comes and goes. Has been avoiding red meat and beans because of gout. Pain has been improving since she has been watching her diet.  Has lost 25 lbs since going to the doctor in August. Has cut back on portions. Skips about 3 meals (lunch or breakfast) per week.   Works at [employer] (8-4:30) and will eat at [employer] when she works there. Lives with her husband and son. Husband does most of the meal preparation at home.   Preferred Learning Style:   No preference indicated   Learning Readiness:   Ready  MEDICATIONS: see list   DIETARY INTAKE:  Usual eating pattern includes 2-3 meals and 1-3 snacks per day.  Avoided foods include: beef, beans     24-hr recall:  B ( AM): oatmeal and a pear or egg sandwich or a bagel with coffee and cream Snk ( AM): yogurt with avocado L ( PM): chicken or chicken salad sandwich or soup with crackers, fries every once in a while  Snk ( PM): sometimes will have an apple, grapes, pear  D ( PM): rice with a stew, greens, and chicken  Snk ( PM): none or peanuts Beverages: water  Usual physical activity: walks at work (on her feet)  Estimated energy needs: 1600 calories 180 g carbohydrates 120 g protein 44 g fat  Progress Towards Goal(s):  In progress.   Nutritional Diagnosis:  [place]-3.3 Overweight/obesity As related to hx of large portion sizes and limited mobility.  As evidenced by BMI of 44.5.    Intervention:  Nutrition counseling provided. Plan: Avoid eating meat in large quantities (organ meat, [place], sardines, game meat). Drink plenty of water especially when having gout pain. Eat oatmeal and beans in  moderation.  Have protein with starch/fruit at each meal or snack. (nuts, cheese, yogurt, eggs, cottage cheese, peanut butter). Try to eat something every 3-5 hours you are awake. Add more vegetables to lunch and dinner. Limit rice (starch) and meat to a quarter of your plate. Use your exercise machine at home 2 x week for 15-20 minutes and increase you when can.   Teaching Method Utilized:  Visual Auditory Hands on  Handouts given during visit include:  MyPlate Handout  15 g CHO Snacks  Foods to limit with gout  Barriers to learning/adherence to lifestyle change: pain, limited mobility  Demonstrated degree of understanding via:  Teach Back   Monitoring/Evaluation:  Dietary intake, exercise,  and body weight prn.

## 2014-04-03 ENCOUNTER — Ambulatory Visit
Admission: RE | Admit: 2014-04-03 | Discharge: 2014-04-03 | Disposition: A | Discharge status: Home / Self Care | Payer: 59 | Source: Ambulatory Visit

## 2014-04-03 DIAGNOSIS — Z1231 Encounter for screening mammogram for malignant neoplasm of breast: Secondary | ICD-10-CM

## 2014-06-25 ENCOUNTER — Emergency Department (HOSPITAL_COMMUNITY)
Admission: EM | Admit: 2014-06-25 | Discharge: 2014-06-25 | Disposition: A | Discharge status: Home / Self Care | Payer: 59 | Attending: Emergency Medicine | Admitting: Emergency Medicine

## 2014-06-25 ENCOUNTER — Emergency Department (HOSPITAL_COMMUNITY): Payer: 59

## 2014-06-25 ENCOUNTER — Encounter (HOSPITAL_COMMUNITY): Payer: Self-pay | Admitting: Emergency Medicine

## 2014-06-25 DIAGNOSIS — Z79899 Other long term (current) drug therapy: Secondary | ICD-10-CM | POA: Diagnosis not present

## 2014-06-25 DIAGNOSIS — M546 Pain in thoracic spine: Secondary | ICD-10-CM | POA: Diagnosis not present

## 2014-06-25 DIAGNOSIS — Z7982 Long term (current) use of aspirin: Secondary | ICD-10-CM | POA: Diagnosis not present

## 2014-06-25 DIAGNOSIS — R079 Chest pain, unspecified: Secondary | ICD-10-CM | POA: Insufficient documentation

## 2014-06-25 LAB — CBC WITH DIFFERENTIAL/PLATELET
Basophils Absolute: 0 10*3/uL (ref 0.0–0.1)
Basophils Relative: 0 % (ref 0–1)
Eosinophils Absolute: 0.1 10*3/uL (ref 0.0–0.7)
Eosinophils Relative: 2 % (ref 0–5)
HCT: 37.5 % (ref 36.0–46.0)
Hemoglobin: 11.4 g/dL — ABNORMAL LOW (ref 12.0–15.0)
Lymphocytes Relative: 44 % (ref 12–46)
Lymphs Abs: 2.5 10*3/uL (ref 0.7–4.0)
MCH: 28.4 pg (ref 26.0–34.0)
MCHC: 30.4 g/dL (ref 30.0–36.0)
MCV: 93.5 fL (ref 78.0–100.0)
Monocytes Absolute: 0.6 10*3/uL (ref 0.1–1.0)
Monocytes Relative: 11 % (ref 3–12)
Neutro Abs: 2.4 10*3/uL (ref 1.7–7.7)
Neutrophils Relative %: 43 % (ref 43–77)
Platelets: 263 10*3/uL (ref 150–400)
RBC: 4.01 MIL/uL (ref 3.87–5.11)
RDW: 13.9 % (ref 11.5–15.5)
WBC: 5.7 10*3/uL (ref 4.0–10.5)

## 2014-06-25 LAB — BASIC METABOLIC PANEL
Anion gap: 7 (ref 5–15)
BUN: 13 mg/dL (ref 6–23)
CO2: 34 mmol/L — ABNORMAL HIGH (ref 19–32)
Calcium: 9 mg/dL (ref 8.4–10.5)
Chloride: 105 mEq/L (ref 96–112)
Creatinine, Ser: 0.52 mg/dL (ref 0.50–1.10)
GFR calc Af Amer: >90 mL/min (ref >90)
GFR calc non Af Amer: >90 mL/min (ref >90)
Glucose, Bld: 91 mg/dL (ref 70–99)
Potassium: 4.8 mmol/L (ref 3.5–5.1)
Sodium: 146 mmol/L — ABNORMAL HIGH (ref 135–145)

## 2014-06-25 LAB — I-STAT TROPONIN, ED: Troponin i, poc: 0 ng/mL (ref 0.00–0.08)

## 2014-06-25 MED ORDER — IBUPROFEN 200 MG PO TABS
600.0000 mg | ORAL_TABLET | Freq: Once | ORAL | Status: AC
  Administered 2014-06-25: 600 mg via ORAL
  Filled 2014-06-25: qty 3

## 2014-06-25 MED ORDER — OXYCODONE-ACETAMINOPHEN 5-325 MG PO TABS
1.0000 | ORAL_TABLET | Freq: Once | ORAL | Status: AC
  Administered 2014-06-25: 1 via ORAL
  Filled 2014-06-25: qty 1

## 2014-06-25 MED ORDER — TRAMADOL HCL 50 MG PO TABS: 50.0000 mg | ORAL_TABLET | Freq: Four times a day (QID) | ORAL | Status: DC | PRN

## 2014-06-25 NOTE — Discharge Instructions (Signed)
Musculoskeletal Pain Musculoskeletal pain is muscle and boney aches and pains. These pains can occur in any part of the body. Your caregiver may treat you without knowing the cause of the pain. They may treat you if blood or urine tests, X-rays, and other tests were normal.  CAUSES There is often not a definite cause or reason for these pains. These pains may be caused by a type of germ (virus). The discomfort may also come from overuse. Overuse includes working out too hard when your body is not fit. Boney aches also come from weather changes. Bone is sensitive to atmospheric pressure changes. HOME CARE INSTRUCTIONS   Ask when your test results will be ready. Make sure you get your test results.  Only take over-the-counter or prescription medicines for pain, discomfort, or fever as directed by your caregiver. If you were given medications for your condition, do not drive, operate machinery or power tools, or sign legal documents for 24 hours. Do not drink alcohol. Do not take sleeping pills or other medications that may interfere with treatment.  Continue all activities unless the activities cause more pain. When the pain lessens, slowly resume normal activities. Gradually increase the intensity and duration of the activities or exercise.  During periods of severe pain, bed rest may be helpful. Lay or sit in any position that is comfortable.  Putting ice on the injured area.  Put ice in a bag.  Place a towel between your skin and the bag.  Leave the ice on for 15 to 20 minutes, 3 to 4 times a day.  Follow up with your caregiver for continued problems and no reason can be found for the pain. If the pain becomes worse or does not go away, it may be necessary to repeat tests or do additional testing. Your caregiver may need to look further for a possible cause. SEEK IMMEDIATE MEDICAL CARE IF:  You have pain that is getting worse and is not relieved by medications.  You develop chest pain  that is associated with shortness or breath, sweating, feeling sick to your stomach (nauseous), or throw up (vomit).  Your pain becomes localized to the abdomen.  You develop any new symptoms that seem different or that concern you. MAKE SURE YOU:   Understand these instructions.  Will watch your condition.  Will get help right away if you are not doing well or get worse. Document Released: 05/25/2005 Document Revised: 08/17/2011 Document Reviewed: 01/27/2013 [facility] Patient Information 2015 [employer], [place]. This information is not intended to replace advice given to you by your health care provider. Make sure you discuss any questions you have with your health care provider.  Musculoskeletal Pain Musculoskeletal pain is muscle and boney aches and pains. These pains can occur in any part of the body. Your caregiver may treat you without knowing the cause of the pain. They may treat you if blood or urine tests, X-rays, and other tests were normal.  CAUSES There is often not a definite cause or reason for these pains. These pains may be caused by a type of germ (virus). The discomfort may also come from overuse. Overuse includes working out too hard when your body is not fit. Boney aches also come from weather changes. Bone is sensitive to atmospheric pressure changes. HOME CARE INSTRUCTIONS   Ask when your test results will be ready. Make sure you get your test results.  Only take over-the-counter or prescription medicines for pain, discomfort, or fever as directed by your  your test results will be ready. Make sure you get your test results.  · Only take over-the-counter or prescription medicines for pain, discomfort, or fever as directed by your caregiver. If you were given medications for your condition, do not drive, operate machinery or power tools, or sign legal documents for 24 hours. Do not drink alcohol. Do not take sleeping pills or other medications that may interfere with treatment.  · Continue all activities unless the activities cause more pain. When the pain lessens, slowly resume normal activities. Gradually increase the intensity and duration of the  activities or exercise.  · During periods of severe pain, bed rest may be helpful. Lay or sit in any position that is comfortable.  · Putting ice on the injured area.  ¨ Put ice in a bag.  ¨ Place a towel between your skin and the bag.  ¨ Leave the ice on for 15 to 20 minutes, 3 to 4 times a day.  · Follow up with your caregiver for continued problems and no reason can be found for the pain. If the pain becomes worse or does not go away, it may be necessary to repeat tests or do additional testing. Your caregiver may need to look further for a possible cause.  SEEK IMMEDIATE MEDICAL CARE IF:  · You have pain that is getting worse and is not relieved by medications.  · You develop chest pain that is associated with shortness or breath, sweating, feeling sick to your stomach (nauseous), or throw up (vomit).  · Your pain becomes localized to the abdomen.  · You develop any new symptoms that seem different or that concern you.  MAKE SURE YOU:   · Understand these instructions.  · Will watch your condition.  · Will get help right away if you are not doing well or get worse.  Document Released: 05/25/2005 Document Revised: 08/17/2011 Document Reviewed: 01/27/2013  ExitCare® Patient Information ©2015 [employer], LLC. This information is not intended to replace advice given to you by your health care provider. Make sure you discuss any questions you have with your health care provider.

## 2014-06-25 NOTE — ED Notes (Addendum)
Pt c/o left sided back pain that started on Saturday randomly. Denies SOB. States it started from her waist and goes up. Also wraps around left shoulder

## 2014-06-27 NOTE — ED Provider Notes (Signed)
CSN: [id number]     Arrival date & time 06/25/14  0735 History   First MD Initiated Contact with Patient 06/25/14 [phone]     Chief Complaint  Patient presents with  . Back Pain  . Chest Pain     (Consider location/radiation/quality/duration/timing/severity/associated sxs/prior Treatment) HPI   Emily Holland with L chest/back pain. Onset Saturday. Denies trauma. Aches. Constant with sharper pain which is worse with movement. No sob. No cough. No n/v. No fever or chills. No unusual leg pain or swelling. No numbness, tingling or focal loss of strength.   History reviewed. No pertinent past medical history. Past Surgical History  Procedure Laterality Date  . Abdominal hysterectomy     No family history on file. History  Substance Use Topics  . Smoking status: Never Smoker   . Smokeless tobacco: Not on file  . Alcohol Use: No   OB History    No data available     Review of Systems  All systems reviewed and negative, other than as noted in HPI.   Allergies  Review of patient's allergies indicates no known allergies.  Home Medications   Prior to Admission medications   Medication Sig Start Date End Date Taking? Authorizing Provider  allopurinol (ZYLOPRIM) 300 MG tablet Take 300 mg by mouth daily.   Yes Historical Provider, MD  aspirin 325 MG EC tablet Take 325 mg by mouth daily.   Yes Historical Provider, MD  colchicine 0.6 MG tablet Take 0.6 mg by mouth 2 (two) times daily.   Yes Historical Provider, MD  fexofenadine-pseudoephedrine (ALLEGRA-D) 60-120 MG per tablet Take 1 tablet by mouth 2 (two) times daily as needed (for allergies).   Yes Historical Provider, MD  Glucosamine-Chondroitin 750-600 MG TABS Take 1 tablet by mouth 2 (two) times daily.   Yes Historical Provider, MD  ibuprofen (ADVIL,MOTRIN) 400 MG tablet Take 400 mg by mouth 2 (two) times daily.   Yes Historical Provider, MD  Olopatadine HCl 0.2 % SOLN Place 1 drop into both eyes daily as needed (for allergies).   Yes  Historical Provider, MD  Vitamin D, Ergocalciferol, (DRISDOL) 50000 UNITS CAPS capsule Take 50,000 Units by mouth every Friday.   Yes Historical Provider, MD  traMADol (ULTRAM) 50 MG tablet Take 1 tablet (50 mg total) by mouth every 6 (six) hours as needed. 06/25/14   Raeford Razor, MD   BP 148/72 mmHg  Pulse 78  Temp(Src) 98.3 F (36.8 C) (Oral)  Resp 20  SpO2 96%  LMP  (Exact Date) Physical Exam  Constitutional: She appears well-developed and well-nourished. No distress.  HENT:  Head: Normocephalic and atraumatic.  Eyes: Conjunctivae are normal. Right eye exhibits no discharge. Left eye exhibits no discharge.  Neck: Neck supple.  Cardiovascular: Normal rate, regular rhythm and normal heart sounds.  Exam reveals no gallop and no friction rub.   No murmur heard. Pulmonary/Chest: Effort normal and breath sounds normal. No respiratory distress. She exhibits no tenderness.  Abdominal: Soft. She exhibits no distension. There is no tenderness.  Musculoskeletal: She exhibits no edema or tenderness.  Neurological: She is alert.  Skin: Skin is warm and dry.  Psychiatric: She has a normal mood and affect. Her behavior is normal. Thought content normal.  Nursing note and vitals reviewed.   ED Course  Procedures (including critical care time) Labs Review Labs Reviewed  CBC WITH DIFFERENTIAL - Abnormal; Notable for the following:    Hemoglobin 11.4 (*)    All other components within normal limits  BASIC  METABOLIC PANEL - Abnormal; Notable for the following:    Sodium 146 (*)    CO2 34 (*)    All other components within normal limits  I-STAT TROPOININ, ED    Imaging Review No results found.   EKG Interpretation   Date/Time:  Monday June 25 2014 07:55:09 EST Ventricular Rate:  61 PR Interval:  191 QRS Duration: 103 QT Interval:  406 QTC Calculation: 409 R Axis:   38 Text Interpretation:  Sinus rhythm Probable left ventricular hypertrophy  Anterior ST elevation, probably  due to LVH ED PHYSICIAN INTERPRETATION  AVAILABLE IN [facility] Confirmed by TEST, Record ([id number]) on  06/27/2014 1:34:05 PM      MDM   Final diagnoses:  Left sided chest pain    Emily Holland with l chest/l mid back pain. Suspect musculoskeletal. Doubt acs, pe, dissection or other emergent etiology.     Raeford RazorStephen Keydi Giel, MD 06/27/14 [phone]

## 2014-07-10 ENCOUNTER — Encounter: Payer: Self-pay | Admitting: Dietician

## 2015-05-20 ENCOUNTER — Other Ambulatory Visit: Payer: Self-pay

## 2015-05-20 DIAGNOSIS — Z1231 Encounter for screening mammogram for malignant neoplasm of breast: Secondary | ICD-10-CM

## 2015-05-23 ENCOUNTER — Ambulatory Visit
Admission: RE | Admit: 2015-05-23 | Discharge: 2015-05-23 | Disposition: A | Discharge status: Home / Self Care | Payer: 59 | Source: Ambulatory Visit

## 2015-05-23 DIAGNOSIS — Z1231 Encounter for screening mammogram for malignant neoplasm of breast: Secondary | ICD-10-CM

## 2015-06-10 DIAGNOSIS — J302 Other seasonal allergic rhinitis: Secondary | ICD-10-CM | POA: Diagnosis not present

## 2015-06-10 DIAGNOSIS — M179 Osteoarthritis of knee, unspecified: Secondary | ICD-10-CM | POA: Diagnosis not present

## 2015-06-10 DIAGNOSIS — Z131 Encounter for screening for diabetes mellitus: Secondary | ICD-10-CM | POA: Diagnosis not present

## 2015-06-10 DIAGNOSIS — M1A9XX0 Chronic gout, unspecified, without tophus (tophi): Secondary | ICD-10-CM | POA: Diagnosis not present

## 2015-06-10 DIAGNOSIS — Z13220 Encounter for screening for lipoid disorders: Secondary | ICD-10-CM | POA: Diagnosis not present

## 2015-06-10 DIAGNOSIS — R42 Dizziness and giddiness: Secondary | ICD-10-CM | POA: Diagnosis not present

## 2015-06-11 ENCOUNTER — Inpatient Hospital Stay (HOSPITAL_COMMUNITY)
Admission: EM | Admit: 2015-06-11 | Discharge: 2015-06-15 | DRG: 378 | Disposition: A | Discharge status: Home / Self Care | Payer: 59 | Attending: Internal Medicine | Admitting: Internal Medicine

## 2015-06-11 ENCOUNTER — Encounter (HOSPITAL_COMMUNITY): Payer: Self-pay | Admitting: Emergency Medicine

## 2015-06-11 DIAGNOSIS — Z6841 Body Mass Index (BMI) 40.0 and over, adult: Secondary | ICD-10-CM | POA: Diagnosis not present

## 2015-06-11 DIAGNOSIS — D649 Anemia, unspecified: Secondary | ICD-10-CM | POA: Diagnosis not present

## 2015-06-11 DIAGNOSIS — K269 Duodenal ulcer, unspecified as acute or chronic, without hemorrhage or perforation: Secondary | ICD-10-CM | POA: Diagnosis present

## 2015-06-11 DIAGNOSIS — K449 Diaphragmatic hernia without obstruction or gangrene: Secondary | ICD-10-CM | POA: Diagnosis present

## 2015-06-11 DIAGNOSIS — R0602 Shortness of breath: Secondary | ICD-10-CM | POA: Diagnosis present

## 2015-06-11 DIAGNOSIS — K921 Melena: Principal | ICD-10-CM | POA: Diagnosis present

## 2015-06-11 DIAGNOSIS — D62 Acute posthemorrhagic anemia: Secondary | ICD-10-CM | POA: Diagnosis present

## 2015-06-11 DIAGNOSIS — Z23 Encounter for immunization: Secondary | ICD-10-CM | POA: Diagnosis not present

## 2015-06-11 DIAGNOSIS — M109 Gout, unspecified: Secondary | ICD-10-CM | POA: Diagnosis not present

## 2015-06-11 DIAGNOSIS — K625 Hemorrhage of anus and rectum: Secondary | ICD-10-CM | POA: Diagnosis not present

## 2015-06-11 DIAGNOSIS — K2980 Duodenitis without bleeding: Secondary | ICD-10-CM | POA: Diagnosis not present

## 2015-06-11 DIAGNOSIS — Z90710 Acquired absence of both cervix and uterus: Secondary | ICD-10-CM

## 2015-06-11 DIAGNOSIS — T39395A Adverse effect of other nonsteroidal anti-inflammatory drugs [NSAID], initial encounter: Secondary | ICD-10-CM | POA: Diagnosis present

## 2015-06-11 DIAGNOSIS — K5900 Constipation, unspecified: Secondary | ICD-10-CM | POA: Diagnosis not present

## 2015-06-11 DIAGNOSIS — R0609 Other forms of dyspnea: Secondary | ICD-10-CM | POA: Diagnosis present

## 2015-06-11 DIAGNOSIS — K922 Gastrointestinal hemorrhage, unspecified: Secondary | ICD-10-CM | POA: Diagnosis present

## 2015-06-11 DIAGNOSIS — D509 Iron deficiency anemia, unspecified: Secondary | ICD-10-CM | POA: Diagnosis not present

## 2015-06-11 LAB — COMPREHENSIVE METABOLIC PANEL
ALT: 14 U/L (ref 14–54)
AST: 20 U/L (ref 15–41)
Albumin: 3.8 g/dL (ref 3.5–5.0)
Alkaline Phosphatase: 68 U/L (ref 38–126)
Anion gap: 10 (ref 5–15)
BUN: 12 mg/dL (ref 6–20)
CO2: 30 mmol/L (ref 22–32)
Calcium: 8.9 mg/dL (ref 8.9–10.3)
Chloride: 103 mmol/L (ref 101–111)
Creatinine, Ser: 0.75 mg/dL (ref 0.44–1.00)
GFR calc Af Amer: >60 mL/min (ref >60)
GFR calc non Af Amer: >60 mL/min (ref >60)
Glucose, Bld: 99 mg/dL (ref 65–99)
Potassium: 3.9 mmol/L (ref 3.5–5.1)
Sodium: 143 mmol/L (ref 135–145)
Total Bilirubin: 0.4 mg/dL (ref 0.3–1.2)
Total Protein: 7.8 g/dL (ref 6.5–8.1)

## 2015-06-11 LAB — CBC
HCT: 18.1 % — ABNORMAL LOW (ref 36.0–46.0)
Hemoglobin: 4.8 g/dL — CL (ref 12.0–15.0)
MCH: 18.7 pg — ABNORMAL LOW (ref 26.0–34.0)
MCHC: 26.5 g/dL — ABNORMAL LOW (ref 30.0–36.0)
MCV: 70.4 fL — ABNORMAL LOW (ref 78.0–100.0)
Platelets: 399 10*3/uL (ref 150–400)
RBC: 2.57 MIL/uL — ABNORMAL LOW (ref 3.87–5.11)
RDW: 19.4 % — ABNORMAL HIGH (ref 11.5–15.5)
WBC: 8.4 10*3/uL (ref 4.0–10.5)

## 2015-06-11 LAB — POC OCCULT BLOOD, ED: Fecal Occult Bld: NEGATIVE

## 2015-06-11 LAB — PREPARE RBC (CROSSMATCH)

## 2015-06-11 LAB — ABO/RH: ABO/RH(D): B POS

## 2015-06-11 MED ORDER — ALLOPURINOL 300 MG PO TABS
300.0000 mg | ORAL_TABLET | Freq: Every day | ORAL | Status: DC
  Administered 2015-06-11 – 2015-06-14 (×4): 300 mg via ORAL
  Filled 2015-06-11 (×4): qty 1

## 2015-06-11 MED ORDER — SODIUM CHLORIDE 0.9 % IJ SOLN
3.0000 mL | Freq: Two times a day (BID) | INTRAMUSCULAR | Status: DC
  Administered 2015-06-12 – 2015-06-14 (×4): 3 mL via INTRAVENOUS

## 2015-06-11 MED ORDER — SODIUM CHLORIDE 0.9 % IV SOLN
10.0000 mL/h | Freq: Once | INTRAVENOUS | Status: AC
  Administered 2015-06-11: 10 mL/h via INTRAVENOUS

## 2015-06-11 MED ORDER — METHOCARBAMOL 500 MG PO TABS
750.0000 mg | ORAL_TABLET | Freq: Two times a day (BID) | ORAL | Status: DC
  Administered 2015-06-11 – 2015-06-14 (×7): 750 mg via ORAL
  Filled 2015-06-11 (×7): qty 2

## 2015-06-11 MED ORDER — OLOPATADINE HCL 0.1 % OP SOLN
1.0000 [drp] | Freq: Two times a day (BID) | OPHTHALMIC | Status: DC
  Administered 2015-06-11 – 2015-06-14 (×7): 1 [drp] via OPHTHALMIC
  Filled 2015-06-11 (×2): qty 5

## 2015-06-11 MED ORDER — TRAMADOL HCL 50 MG PO TABS
50.0000 mg | ORAL_TABLET | Freq: Four times a day (QID) | ORAL | Status: DC | PRN
  Administered 2015-06-13 – 2015-06-14 (×4): 50 mg via ORAL
  Filled 2015-06-11 (×4): qty 1

## 2015-06-11 NOTE — ED Notes (Signed)
Bed: WA07 Expected date:  Expected time:  Means of arrival:  Comments: Hold for triage 2 

## 2015-06-11 NOTE — ED Notes (Signed)
Nurse is drawing labs 

## 2015-06-11 NOTE — H&P (Signed)
[facility] History and Physical  Emily Holland WUJ:[id number] DOB: 11-03-1960 DOA: 06/11/2015  Referring physician: EDP PCP: [name], MD   Chief Complaint: Anemia   HPI: Emily Holland is a 55 y.o. female who is sent in by her PCP for low HGB.  Patient has had symptoms of anemia including fatigue, DOE, etc for past couple of weeks.  She has had no obvious signs of bleeding per her, but ROS does reveal that her stool has become very dark in color over the last couple of weeks which is new.  No menstruation secondary to PMH of hysterectomy in past.  No abdominal pain, no vomiting.  In addition to ASA 325, she also takes a number of different NSAIDS due to history of gout.  Med list review includes both colchicine and Ibuprofen.  Review of Systems: Systems reviewed.  As above, otherwise negative  Past Medical History  Diagnosis Date  . Gout   . No pertinent past medical history   . Shortness of breath on exertion 06/03/11   Past Surgical History  Procedure Laterality Date  . Vaginal hysterectomy  ~ 2005  . Cervical conization w/bx  08/2004  . Abdominal hysterectomy     Social History:  reports that she has never smoked. She does not have any smokeless tobacco history on file. She reports that she does not drink alcohol or use illicit drugs.  No Known Allergies  History reviewed. No pertinent family history.   Prior to Admission medications   Medication Sig Start Date End Date Taking? Authorizing Provider  allopurinol (ZYLOPRIM) 300 MG tablet Take 300 mg by mouth daily.  05/16/15  Yes Historical Provider, MD  Ascorbic Acid (VITAMIN C) POWD Take 1 packet by mouth daily.   Yes Historical Provider, MD  aspirin 325 MG EC tablet Take 325 mg by mouth daily.   Yes Historical Provider, MD  calcium carbonate (TUMS - DOSED IN MG ELEMENTAL CALCIUM) 500 MG chewable tablet Chew 1 tablet by mouth as needed for indigestion or heartburn.   Yes Historical Provider, MD   Chlorphen-PE-Acetaminophen (NOREL AD) 4-10-325 MG TABS Take 1 tablet by mouth 2 (two) times daily as needed (cold symptoms).    Yes Historical Provider, MD  colchicine 0.6 MG tablet Take 0.6 mg by mouth daily as needed (gout).    Yes Historical Provider, MD  Glucosamine-Chondroitin 750-600 MG TABS Take 1 tablet by mouth 2 (two) times daily.   Yes Historical Provider, MD  ibuprofen (ADVIL,MOTRIN) 200 MG tablet Take 400 mg by mouth daily as needed. For pain    Yes Historical Provider, MD  ibuprofen (ADVIL,MOTRIN) 400 MG tablet Take 400 mg by mouth 2 (two) times daily.   Yes Historical Provider, MD  methocarbamol (ROBAXIN) 750 MG tablet Take 750 mg by mouth 2 (two) times daily.  05/16/15  Yes Historical Provider, MD  Olopatadine HCl 0.2 % SOLN Place 1 drop into both eyes daily as needed (for allergies).   Yes Historical Provider, MD  traMADol (ULTRAM) 50 MG tablet Take 1 tablet (50 mg total) by mouth every 6 (six) hours as needed. 06/25/14  Yes Raeford Razor, MD  albuterol (PROVENTIL HFA;VENTOLIN HFA) 108 (90 BASE) MCG/ACT inhaler Inhale 1-2 puffs into the lungs every 4 (four) hours as needed for wheezing or shortness of breath. 06/06/11 06/05/12  Alison Murray, MD  diphenhydrAMINE (BENADRYL) 25 MG tablet Take 25 mg by mouth every 6 (six) hours as needed. For allergies     Historical Provider, MD  fexofenadine-pseudoephedrine (ALLEGRA-D) 60-120 MG per tablet Take 1 tablet by mouth 2 (two) times daily as needed (for allergies).    Historical Provider, MD  Vitamin D, Ergocalciferol, (DRISDOL) 50000 UNITS CAPS Take 50,000 Units by mouth every 7 (seven) days.     Historical Provider, MD   Physical Exam: Filed Vitals:   06/11/15 1937 06/11/15 2022  BP: 150/74 138/65  Pulse: 80 73  Temp: 99 F (37.2 C) 98.9 F (37.2 C)  Resp: 18 18    BP 138/65 mmHg  Pulse 73  Temp(Src) 98.9 F (37.2 C) (Oral)  Resp 18  SpO2 98%  LMP  (Exact Date)  General Appearance:    Alert, oriented, no distress, appears  stated age  Head:    Normocephalic, atraumatic  Eyes:    PERRL, EOMI, sclera non-icteric        Nose:   Nares without drainage or epistaxis. Mucosa, turbinates normal  Throat:   Moist mucous membranes. Oropharynx without erythema or exudate.  Neck:   Supple. No carotid bruits.  No thyromegaly.  No lymphadenopathy.   Back:     No CVA tenderness, no spinal tenderness  Lungs:     Clear to auscultation bilaterally, without wheezes, rhonchi or rales  Chest wall:    No tenderness to palpitation  Heart:    Regular rate and rhythm without murmurs, gallops, rubs  Abdomen:     Soft, non-tender, nondistended, normal bowel sounds, no organomegaly  Genitalia:    deferred  Rectal:    deferred  Extremities:   No clubbing, cyanosis or edema.  Pulses:   2+ and symmetric all extremities  Skin:   Skin color, texture, turgor normal, no rashes or lesions  Lymph nodes:   Cervical, supraclavicular, and axillary nodes normal  Neurologic:   CNII-XII intact. Normal strength, sensation and reflexes      throughout    Labs on Admission:  Basic Metabolic Panel:  Recent Labs Lab 06/11/15 1740  NA 143  K 3.9  CL 103  CO2 30  GLUCOSE 99  BUN 12  CREATININE 0.75  CALCIUM 8.9   Liver Function Tests:  Recent Labs Lab 06/11/15 1740  AST 20  ALT 14  ALKPHOS 68  BILITOT 0.4  PROT 7.8  ALBUMIN 3.8   No results for input(s): LIPASE, AMYLASE in the last 168 hours. No results for input(s): AMMONIA in the last 168 hours. CBC:  Recent Labs Lab 06/11/15 1740  WBC 8.4  HGB 4.8*  HCT 18.1*  MCV 70.4*  PLT 399   Cardiac Enzymes: No results for input(s): CKTOTAL, CKMB, CKMBINDEX, TROPONINI in the last 168 hours.  BNP (last 3 results) No results for input(s): PROBNP in the last 8760 hours. CBG: No results for input(s): GLUCAP in the last 168 hours.  Radiological Exams on Admission: No results found.  EKG: Independently reviewed.  Assessment/Plan Principal Problem:   Acute blood loss  anemia   1. Acute blood loss anemia - 1. Getting transfused already in ED, 3 units ordered 1. Due to already being transfused, not ordering anemia panel. 2. Repeat CBC in AM 3. Hemoccult is pending on patient but strongly suspect that this will be positive. 4. History is suspicious for NSAID related ulcer 5. Call GI in AM if guiac positive 6. Clear liquid diet only 7. Holding ASA and NSAIDS 8. SCDs only for DVT ppx.    Code Status: Full  Family Communication: Family at bedside Disposition Plan: Admit to inpatient   Time spent: 35  min  Quana Chamberlain M. [facility] Pager [phone]  If 7AM-7PM, please contact the day team taking care of the patient [email or web address] Password [email or web address] 06/11/2015, 8:54 PM

## 2015-06-11 NOTE — ED Notes (Addendum)
Pt states that she was called by her PCP and told to come in as her HGB was low. Unknown number. States she has felt weak and SOB on exertion. Denies bleeding of any kind. Alert and oriented.

## 2015-06-12 ENCOUNTER — Encounter (HOSPITAL_COMMUNITY): Payer: Self-pay

## 2015-06-12 DIAGNOSIS — D649 Anemia, unspecified: Secondary | ICD-10-CM

## 2015-06-12 DIAGNOSIS — M109 Gout, unspecified: Secondary | ICD-10-CM

## 2015-06-12 DIAGNOSIS — R0609 Other forms of dyspnea: Secondary | ICD-10-CM

## 2015-06-12 LAB — CBC
HCT: 25.8 % — ABNORMAL LOW (ref 36.0–46.0)
Hemoglobin: 7.8 g/dL — ABNORMAL LOW (ref 12.0–15.0)
MCH: 22.9 pg — ABNORMAL LOW (ref 26.0–34.0)
MCHC: 30.2 g/dL (ref 30.0–36.0)
MCV: 75.7 fL — ABNORMAL LOW (ref 78.0–100.0)
Platelets: 303 10*3/uL (ref 150–400)
RBC: 3.41 MIL/uL — ABNORMAL LOW (ref 3.87–5.11)
RDW: 20.2 % — ABNORMAL HIGH (ref 11.5–15.5)
WBC: 6.8 10*3/uL (ref 4.0–10.5)

## 2015-06-12 LAB — HEMOGLOBIN AND HEMATOCRIT, BLOOD
HCT: 26.3 % — ABNORMAL LOW (ref 36.0–46.0)
Hemoglobin: 7.8 g/dL — ABNORMAL LOW (ref 12.0–15.0)

## 2015-06-12 LAB — BASIC METABOLIC PANEL
Anion gap: 7 (ref 5–15)
BUN: 9 mg/dL (ref 6–20)
CO2: 32 mmol/L (ref 22–32)
Calcium: 8.4 mg/dL — ABNORMAL LOW (ref 8.9–10.3)
Chloride: 102 mmol/L (ref 101–111)
Creatinine, Ser: 0.62 mg/dL (ref 0.44–1.00)
GFR calc Af Amer: >60 mL/min (ref >60)
GFR calc non Af Amer: >60 mL/min (ref >60)
Glucose, Bld: 92 mg/dL (ref 65–99)
Potassium: 3.9 mmol/L (ref 3.5–5.1)
Sodium: 141 mmol/L (ref 135–145)

## 2015-06-12 MED ORDER — PANTOPRAZOLE SODIUM 40 MG IV SOLR
40.0000 mg | Freq: Two times a day (BID) | INTRAVENOUS | Status: DC
  Administered 2015-06-12 – 2015-06-14 (×6): 40 mg via INTRAVENOUS
  Filled 2015-06-12 (×6): qty 40

## 2015-06-12 MED ORDER — PEG 3350-KCL-NA BICARB-NACL 420 G PO SOLR
4000.0000 mL | Freq: Once | ORAL | Status: AC
  Administered 2015-06-12: 4000 mL via ORAL

## 2015-06-12 MED ORDER — INFLUENZA VAC SPLIT QUAD 0.5 ML IM SUSY
0.5000 mL | PREFILLED_SYRINGE | INTRAMUSCULAR | Status: AC
  Administered 2015-06-13: 0.5 mL via INTRAMUSCULAR
  Filled 2015-06-12 (×2): qty 0.5

## 2015-06-12 NOTE — Consult Note (Signed)
Reason for Consult: Severe anemia with blood in stool. Referring Physician: THP.  Emily Holland is an 55 y.o. female.  HPI: 55 year old black female, admitted to [facility] with worsening shortness of breath and fatigue. Found to have a hemoglobin of 4.8 gm/dl on admission. Patient gives a history of rectal bleeding alternating with melenic stools for the last 4-5 months. She denies having any abdominal pain, nausea or vomiting, dysphagia or odynophagia. She has a good appetite and her weight has been stable. She had a normal colonoscopy in 2011 done by Dr. Benson Norway. She has gout and take Colchicine and Allopurinol on a daily basis. It is not clear if she is taking NSAIDS or not.   Past Medical History  Diagnosis Date  . Gout   .  Internal and external hemorrhoids   .  Morbid obesity 06/03/11   Past Surgical History  Procedure Laterality Date  . Vaginal hysterectomy  ~ 2005  . Cervical conization w/bx  08/2004  . Abdominal hysterectomy     History reviewed. No pertinent family history.  Social History:  reports that she has never smoked. She does not have any smokeless tobacco history on file. She reports that she does not drink alcohol or use illicit drugs.  Allergies: No Known Allergies  Medications: I have reviewed the patient's current medications.  Results for orders placed or performed during the hospital encounter of 06/11/15 (from the past 48 hour(s))  Comprehensive metabolic panel     Status: None   Collection Time: 06/11/15  5:40 PM  Result Value Ref Range   Sodium 143 135 - 145 mmol/L   Potassium 3.9 3.5 - 5.1 mmol/L   Chloride 103 101 - 111 mmol/L   CO2 30 22 - 32 mmol/L   Glucose, Bld 99 65 - 99 mg/dL   BUN 12 6 - 20 mg/dL   Creatinine, Ser 0.75 0.44 - 1.00 mg/dL   Calcium 8.9 8.9 - 10.3 mg/dL   Total Protein 7.8 6.5 - 8.1 g/dL   Albumin 3.8 3.5 - 5.0 g/dL   AST 20 15 - 41 U/L   ALT 14 14 - 54 U/L   Alkaline Phosphatase 68 38 - 126 U/L   Total Bilirubin 0.4 0.3 -  1.2 mg/dL   GFR calc non Af Amer >60 >60 mL/min   GFR calc Af Amer >60 >60 mL/min    Comment: (NOTE) The eGFR has been calculated using the CKD EPI equation. This calculation has not been validated in all clinical situations. eGFR's persistently <60 mL/min signify possible Chronic Kidney Disease.    Anion gap 10 5 - 15  CBC     Status: Abnormal   Collection Time: 06/11/15  5:40 PM  Result Value Ref Range   WBC 8.4 4.0 - 10.5 K/uL   RBC 2.57 (L) 3.87 - 5.11 MIL/uL   Hemoglobin 4.8 (LL) 12.0 - 15.0 g/dL    Comment: REPEATED TO VERIFY CRITICAL RESULT CALLED TO, READ BACK BY AND VERIFIED WITH: JENNIFER HAMILTON RN 1.3.17 @ 1835 BY RICEJ    HCT 18.1 (L) 36.0 - 46.0 %   MCV 70.4 (L) 78.0 - 100.0 fL   MCH 18.7 (L) 26.0 - 34.0 pg   MCHC 26.5 (L) 30.0 - 36.0 g/dL   RDW 19.4 (H) 11.5 - 15.5 %   Platelets 399 150 - 400 K/uL  ABO/Rh     Status: None   Collection Time: 06/11/15  5:40 PM  Result Value Ref Range  ABO/RH(D) B POS   Type and screen [place]     Status: None (Preliminary result)   Collection Time: 06/11/15  5:41 PM  Result Value Ref Range   ABO/RH(D) B POS    Antibody Screen NEG    Sample Expiration 06/14/2015    Unit Number [id number]    Blood Component Type RED CELLS,LR    Unit division 00    Status of Unit ISSUED    Transfusion Status OK TO TRANSFUSE    Crossmatch Result Compatible    Unit Number [id number]    Blood Component Type RED CELLS,LR    Unit division 00    Status of Unit ISSUED,FINAL    Transfusion Status OK TO TRANSFUSE    Crossmatch Result Compatible    Unit Number [id number]    Blood Component Type RED CELLS,LR    Unit division 00    Status of Unit ISSUED    Transfusion Status OK TO TRANSFUSE    Crossmatch Result Compatible   Prepare RBC     Status: None   Collection Time: 06/11/15  7:00 PM  Result Value Ref Range   Order Confirmation ORDER PROCESSED BY BLOOD BANK   POC occult blood, ED     Status: None    Collection Time: 06/11/15  9:03 PM  Result Value Ref Range   Fecal Occult Bld NEGATIVE NEGATIVE  Basic metabolic panel     Status: Abnormal   Collection Time: 06/12/15  9:03 AM  Result Value Ref Range   Sodium 141 135 - 145 mmol/L   Potassium 3.9 3.5 - 5.1 mmol/L   Chloride 102 101 - 111 mmol/L   CO2 32 22 - 32 mmol/L   Glucose, Bld 92 65 - 99 mg/dL   BUN 9 6 - 20 mg/dL   Creatinine, Ser 0.62 0.44 - 1.00 mg/dL   Calcium 8.4 (L) 8.9 - 10.3 mg/dL   GFR calc non Af Amer >60 >60 mL/min   GFR calc Af Amer >60 >60 mL/min    Comment: (NOTE) The eGFR has been calculated using the CKD EPI equation. This calculation has not been validated in all clinical situations. eGFR's persistently <60 mL/min signify possible Chronic Kidney Disease.    Anion gap 7 5 - 15  CBC     Status: Abnormal   Collection Time: 06/12/15  9:03 AM  Result Value Ref Range   WBC 6.8 4.0 - 10.5 K/uL   RBC 3.41 (L) 3.87 - 5.11 MIL/uL   Hemoglobin 7.8 (L) 12.0 - 15.0 g/dL    Comment: REPEATED TO VERIFY DELTA CHECK NOTED POST TRANSFUSION SPECIMEN    HCT 25.8 (L) 36.0 - 46.0 %   MCV 75.7 (L) 78.0 - 100.0 fL    Comment: REPEATED TO VERIFY DELTA CHECK NOTED POST TRANSFUSION SPECIMEN    MCH 22.9 (L) 26.0 - 34.0 pg   MCHC 30.2 30.0 - 36.0 g/dL   RDW 20.2 (H) 11.5 - 15.5 %   Platelets 303 150 - 400 K/uL   Review of Systems  Constitutional: Negative.   Eyes: Negative.   Respiratory: Positive for shortness of breath.   Cardiovascular: Negative.   Gastrointestinal: Positive for constipation, blood in stool and melena. Negative for heartburn, nausea, vomiting and abdominal pain.  Musculoskeletal: Negative.   Skin: Negative.   Neurological: Negative.   Endo/Heme/Allergies: Negative.   Psychiatric/Behavioral: Negative.    Blood pressure 98/47, pulse 71, temperature 97.8 F (36.6 C), temperature source Oral, resp. rate 18, height 5'  6" (1.676 m), weight 128.6 kg (283 lb 8.2 oz), SpO2 98 %. Physical Exam   Constitutional: She is oriented to person, place, and time. She appears well-developed and well-nourished.  HENT:  Head: Normocephalic and atraumatic.  Eyes: Conjunctivae and EOM are normal. Pupils are equal, round, and reactive to light.  Neck: Normal range of motion. Neck supple.  Cardiovascular: Normal rate and regular rhythm.   Respiratory: Breath sounds normal.  GI: Soft. Bowel sounds are normal.  Neurological: She is alert and oriented to person, place, and time.  Skin: Skin is warm and dry.  Psychiatric: She has a normal mood and affect. Her behavior is normal. Judgment and thought content normal.   Assessment/Plan: 1) Severe anemia with blood in stool: will proceed with an EGD/Colonoscopy at this time. Will prep tonite.  2) Gout. 3) Obesity.  Shaneal Barasch 06/12/2015, 12:23 PM

## 2015-06-12 NOTE — Progress Notes (Signed)
[facility]                                                                              Patient Demographics  Emily Holland, is a 55 y.o. female, DOB - 12/03/1960, VHQ:[id number]  Admit date - 06/11/2015   Admitting Physician Hillary BowJared M Gardner, DO  Outpatient Primary MD for the patient is Dorrene GermanAVBUERE,EDWIN A, MD  LOS - 1   Chief Complaint  Patient presents with  . Abnormal Lab      HPI on 06/11/2015 by Dr. Verdie ShireJared Gardner Emily Holland is a 55 y.o. female who is sent in by her PCP for low HGB. Patient has had symptoms of anemia including fatigue, DOE, etc for past couple of weeks. She has had no obvious signs of bleeding per her, but ROS does reveal that her stool has become very dark in color over the last couple of weeks which is new. No menstruation secondary to PMH of hysterectomy in past. No abdominal pain, no vomiting.  In addition to ASA 325, she also takes a number of different NSAIDS due to history of gout. Med list review includes both colchicine and Ibuprofen.  Assessment & Plan   Acute blood loss anemia/GI Bleed/Symptomatic Anemia -Hemoglobin at admission 4.8, baseline 11-12 -patient has had DOE for several days -Given 3uPRBCs, hemoglobin 7.8 -FOBT + -Gastroenterology consulted and appreciated  -possibly due ?NSAID (taking Aspirin) -Last colonoscopy was in 2011 by Dr. Elnoria HowardHung  DOE -Secondary to the above.  Gout -Continue allopurinol  -Colchicine held, patient wa  Code Status: Full  Family Communication: None at bedside  Disposition Plan: Admitted, pending GI recommendations  Time Spent in minutes   30 minutes  Procedures  None  Consults   Gastroenterology  DVT Prophylaxis  SCDs  Lab Results  Component Value Date   PLT 303 06/12/2015    Medications  Scheduled Meds: . allopurinol  300 mg Oral Daily  . [START ON 06/13/2015] Influenza vac split quadrivalent PF  0.5 mL Intramuscular Tomorrow-1000  . methocarbamol  750 mg Oral BID  .  olopatadine  1 drop Both Eyes BID  . pantoprazole (PROTONIX) IV  40 mg Intravenous Q12H  . sodium chloride  3 mL Intravenous Q12H   Continuous Infusions:  PRN Meds:.traMADol  Antibiotics    Anti-infectives    None      Subjective:   Emily Holland seen and examined today.  She states she is feeling tired.  Feels shortness of breath has improved. States she is having dark formed stools.  Denies diarrhea, abdominal pain, chest pain.   Objective:   Filed Vitals:   06/12/15 0255 06/12/15 0409 06/12/15 0430 06/12/15 0607  BP: 120/63 135/63 124/57 98/47  Pulse: 71 67 71   Temp: 98.3 F (36.8 C) 98.1 F (36.7 C) 98.6 F (37 C) 97.8 F (36.6 C)  TempSrc: Oral Oral Oral Oral  Resp: 18 18 18 18   Height:      Weight:      SpO2: 100% 98% 100% 98%    Wt Readings from Last 3 Encounters:  06/11/15 128.6 kg (283 lb 8.2 oz)  02/27/14 124.83 kg (275 lb 3.2 oz)  06/05/11 117.935 kg (260  lb)     Intake/Output Summary (Last 24 hours) at 06/12/15 1340 Last data filed at 06/12/15 0605  Gross per 24 hour  Intake 1152.83 ml  Output      0 ml  Net 1152.83 ml    Exam  General: Well developed, well nourished, NAD, appears stated age  HEENT: NCAT, PERRLA, EOMI, Anicteic Sclera, mucous membranes moist.   Neck: Supple, no JVD, no masses  Cardiovascular: S1 S2 auscultated, no rubs, murmurs or gallops. Regular rate and rhythm.  Respiratory: Clear to auscultation bilaterally with equal chest rise  Abdomen: Soft, nontender, nondistended, + bowel sounds  Extremities: warm dry without cyanosis clubbing or edema  Neuro: AAOx3, cranial nerves grossly intact. Strength 5/5 in patient's upper and lower extremities bilaterally  Skin: Without rashes exudates or nodules  Psych: Normal affect and demeanor with intact judgement and insight    Data Review   Micro Results No results found for this or any previous visit (from the past 240 hour(s)).  Radiology Reports Mm Digital  Screening Bilateral  05/23/2015  CLINICAL DATA:  Screening. EXAM: DIGITAL SCREENING BILATERAL MAMMOGRAM WITH CAD COMPARISON:  Previous exam(s). ACR Breast Density Category b: There are scattered areas of fibroglandular density. FINDINGS: There are no findings suspicious for malignancy. Images were processed with CAD. IMPRESSION: No mammographic evidence of malignancy. A result letter of this screening mammogram will be mailed directly to the patient. RECOMMENDATION: Screening mammogram in one year. (Code:SM-B-01Y) BI-RADS CATEGORY  1: Negative. Electronically Signed   By: Amie Portland M.D.   On: 05/23/2015 13:36    CBC  Recent Labs Lab 06/11/15 1740 06/12/15 0903  WBC 8.4 6.8  HGB 4.8* 7.8*  HCT 18.1* 25.8*  PLT 399 303  MCV 70.4* 75.7*  MCH 18.7* 22.9*  MCHC 26.5* 30.2  RDW 19.4* 20.2*    Chemistries   Recent Labs Lab 06/11/15 1740 06/12/15 0903  NA 143 141  K 3.9 3.9  CL 103 102  CO2 30 32  GLUCOSE 99 92  BUN 12 9  CREATININE 0.75 0.62  CALCIUM 8.9 8.4*  AST 20  --   ALT 14  --   ALKPHOS 68  --   BILITOT 0.4  --    ------------------------------------------------------------------------------------------------------------------ estimated creatinine clearance is 107.8 mL/min (by C-G formula based on Cr of 0.62). ------------------------------------------------------------------------------------------------------------------ No results for input(s): HGBA1C in the last 72 hours. ------------------------------------------------------------------------------------------------------------------ No results for input(s): CHOL, HDL, LDLCALC, TRIG, CHOLHDL, LDLDIRECT in the last 72 hours. ------------------------------------------------------------------------------------------------------------------ No results for input(s): TSH, T4TOTAL, T3FREE, THYROIDAB in the last 72 hours.  Invalid input(s):  FREET3 ------------------------------------------------------------------------------------------------------------------ No results for input(s): VITAMINB12, FOLATE, FERRITIN, TIBC, IRON, RETICCTPCT in the last 72 hours.  Coagulation profile No results for input(s): INR, PROTIME in the last 168 hours.  No results for input(s): DDIMER in the last 72 hours.  Cardiac Enzymes No results for input(s): CKMB, TROPONINI, MYOGLOBIN in the last 168 hours.  Invalid input(s): CK ------------------------------------------------------------------------------------------------------------------ Invalid input(s): POCBNP    Emily Holland D.O. on 06/12/2015 at 1:40 PM  Between 7am to 7pm - Pager - [phone]  After 7pm go to [email or web address] - password [email or web address]  And look for the night coverage person covering for me after hours  [facility] Office  [phone]

## 2015-06-13 ENCOUNTER — Encounter (HOSPITAL_COMMUNITY)
Admission: EM | Disposition: A | Discharge status: Home / Self Care | Payer: Self-pay | Source: Home / Self Care | Attending: Internal Medicine

## 2015-06-13 ENCOUNTER — Encounter (HOSPITAL_COMMUNITY): Payer: Self-pay

## 2015-06-13 HISTORY — PX: ESOPHAGOGASTRODUODENOSCOPY: SHX5428

## 2015-06-13 HISTORY — PX: COLONOSCOPY: SHX5424

## 2015-06-13 LAB — BASIC METABOLIC PANEL
Anion gap: 10 (ref 5–15)
BUN: 9 mg/dL (ref 6–20)
CO2: 29 mmol/L (ref 22–32)
Calcium: 8.6 mg/dL — ABNORMAL LOW (ref 8.9–10.3)
Chloride: 107 mmol/L (ref 101–111)
Creatinine, Ser: 0.73 mg/dL (ref 0.44–1.00)
GFR calc Af Amer: >60 mL/min (ref >60)
GFR calc non Af Amer: >60 mL/min (ref >60)
Glucose, Bld: 96 mg/dL (ref 65–99)
Potassium: 4.1 mmol/L (ref 3.5–5.1)
Sodium: 146 mmol/L — ABNORMAL HIGH (ref 135–145)

## 2015-06-13 LAB — CBC
HCT: 25.4 % — ABNORMAL LOW (ref 36.0–46.0)
Hemoglobin: 7.4 g/dL — ABNORMAL LOW (ref 12.0–15.0)
MCH: 21.9 pg — ABNORMAL LOW (ref 26.0–34.0)
MCHC: 29.1 g/dL — ABNORMAL LOW (ref 30.0–36.0)
MCV: 75.1 fL — ABNORMAL LOW (ref 78.0–100.0)
Platelets: 293 10*3/uL (ref 150–400)
RBC: 3.38 MIL/uL — ABNORMAL LOW (ref 3.87–5.11)
RDW: 20.3 % — ABNORMAL HIGH (ref 11.5–15.5)
WBC: 9.7 10*3/uL (ref 4.0–10.5)

## 2015-06-13 LAB — TYPE AND SCREEN
ABO/RH(D): B POS
Antibody Screen: NEGATIVE
Unit division: 0
Unit division: 0
Unit division: 0

## 2015-06-13 LAB — HEMOGLOBIN AND HEMATOCRIT, BLOOD
HCT: 28.8 % — ABNORMAL LOW (ref 36.0–46.0)
Hemoglobin: 8.3 g/dL — ABNORMAL LOW (ref 12.0–15.0)

## 2015-06-13 SURGERY — COLONOSCOPY
Anesthesia: Moderate Sedation

## 2015-06-13 SURGERY — EGD (ESOPHAGOGASTRODUODENOSCOPY)
Anesthesia: Moderate Sedation

## 2015-06-13 MED ORDER — MIDAZOLAM HCL 5 MG/ML IJ SOLN
INTRAMUSCULAR | Status: AC
  Filled 2015-06-13: qty 2

## 2015-06-13 MED ORDER — SODIUM CHLORIDE 0.9 % IV SOLN
INTRAVENOUS | Status: DC
  Administered 2015-06-13: 16:00:00 via INTRAVENOUS

## 2015-06-13 MED ORDER — MIDAZOLAM HCL 5 MG/5ML IJ SOLN
INTRAMUSCULAR | Status: DC | PRN
  Administered 2015-06-13 (×4): 1 mg via INTRAVENOUS
  Administered 2015-06-13: 2 mg via INTRAVENOUS
  Administered 2015-06-13 (×3): 1 mg via INTRAVENOUS

## 2015-06-13 MED ORDER — FENTANYL CITRATE (PF) 100 MCG/2ML IJ SOLN
INTRAMUSCULAR | Status: AC
  Filled 2015-06-13: qty 2

## 2015-06-13 MED ORDER — FENTANYL CITRATE (PF) 100 MCG/2ML IJ SOLN
INTRAMUSCULAR | Status: DC | PRN
  Administered 2015-06-13: 12.5 ug via INTRAVENOUS
  Administered 2015-06-13: 25 ug via INTRAVENOUS
  Administered 2015-06-13 (×3): 12.5 ug via INTRAVENOUS
  Administered 2015-06-13: 25 ug via INTRAVENOUS

## 2015-06-13 MED ORDER — SODIUM CHLORIDE 0.9 % IV SOLN
INTRAVENOUS | Status: DC
  Administered 2015-06-13: 10:00:00 via INTRAVENOUS

## 2015-06-13 NOTE — Op Note (Signed)
[facility] [address] [place], [id number]   COLONOSCOPY PROCEDURE REPORT  PATIENT: Emily Holland, Emily Holland  MR#: [id number] BIRTHDATE: [birth year] , 55  yrs. old GENDER: female ENDOSCOPIST: Jeani HawkingPatrick Eldar Robitaille, MD REFERRED BY: PROCEDURE DATE:  06/13/2015 PROCEDURE:   Colonoscopy, diagnostic ASA CLASS:   Class III INDICATIONS:iron deficiency anemia. MEDICATIONS: See the Enteroscopy report.  DESCRIPTION OF PROCEDURE:   After the risks and benefits and of the procedure were explained, informed consent was obtained.  revealed no abnormalities of the rectum.    The Pentax Ped Colon [id number] endoscope was introduced through the anus and advanced to the cecum, which was identified by both the appendix and ileocecal valve .  The quality of the prep was poor, but extensive washing resulted in good to excellent views of the mucosa.  The instrument was then slowly withdrawn as the colon was fully examined. Estimated blood loss is zero unless otherwise noted in this procedure report.    FINDINGS: The procedure was extremely difficult to perform.  The prep was poor and precluded adequate visualization.  The fiber residue clogged the suction channel repeatedly.  Additionally, the colon was torturous and significant looping occurred.  The patient was placed in the supine and right lateral decubitus positions to assist in cecal intbuation, but all attempts failed.  A return to the left lateral decubitus position somehow allowed for cecal intubation.  A normal appearing cecum, ileocecal valve, and appendiceal orifice were identified.  The ascending, transverse, descending, sigmoid colon, and rectum appeared unremarkable. Retroflexed views revealed no abnormalities.     The scope was then withdrawn from the patient and the procedure completed.  WITHDRAWAL TIME: 9.9 minutes  COMPLICATIONS: There were no immediate complications. ENDOSCOPIC IMPRESSION: 1) Normal  colon.  RECOMMENDATIONS: 1) Repeat the capsule endoscopy.  REPEAT EXAM:  cc:  _______________________________ eSignedJeani Hawking:  Emily Bogie, MD 06/13/2015 4:40 PM   CPT CODES: ICD CODES:

## 2015-06-13 NOTE — Interval H&P Note (Signed)
History and Physical Interval Note:  06/13/2015 1:48 PM  Emily Holland  has presented today for surgery, with the diagnosis of Anemia with blood in stool  The various methods of treatment have been discussed with the patient and family. After consideration of risks, benefits and other options for treatment, the patient has consented to  Procedure(s): ESOPHAGOGASTRODUODENOSCOPY (EGD)/Colonoscopy (N/A) COLONOSCOPY (N/A) as a surgical intervention .  The patient's history has been reviewed, patient examined, no change in status, stable for surgery.  I have reviewed the patient's chart and labs.  Questions were answered to the patient's satisfaction.     Asaad Gulley D

## 2015-06-13 NOTE — Op Note (Signed)
[facility] [address] [place], [id number]   ENTEROSCOPY PROCEDURE REPORT     EXAM DATE: 06/13/2015  PATIENT NAME:      Emily Holland, Emily Holland           MR #:      [id number]  BIRTHDATE:       1960/09/11      VISIT #:     [id number]  ATTENDING:     Jeani HawkingPatrick Kristel Durkee, MD     STATUS:     outpatient ASSISTANT:      Beryle BeamsBillups, Janie and Monday, Allegra GranaSarah REFERRING MD: [facility] ASA CLASS:        Class III  INDICATIONS:  The patient is a 55 yr old female here for an enteroscopy procedure due to iron deficiency anemia. PROCEDURE PERFORMED:     Diagnostic small bowel enteroscopy  MEDICATIONS:     Versed 9 mg IV and Fentanyl 100 mcg IV  CONSENT: The patient understands the risks and benefits of the procedure and understands that these risks include, but are not limited to: sedation, allergic reaction, infection, perforation and/or bleeding. Alternative means of evaluation and treatment include, among others: physical exam, x-rays, and/or surgical intervention. The patient elects to proceed with this endoscopic procedure.  DESCRIPTION OF PROCEDURE: During intra-op preparation period all mechanical & medical equipment was checked for proper function. Hand hygiene and appropriate measures for infection prevention was taken. After the risks, benefits and alternatives of the procedure were thoroughly explained, Informed consent was verified, confirmed and timeout was successfully executed by the treatment team. The    endoscope was introduced through the mouth and advanced to the proximal jejunum jejunum. The prep was The overall prep quality was excellent.. The instrument was then slowly withdrawn while examining the mucosa circumferentially. The scope was then completely withdrawn from the patient and the procedure terminated. The pulse, BP, and O2 saturation were monitored and documented by the physician and the nursing staff throughout the entire procedure.  The  patient was cared for as planned according to standard protocol, then discharged to recovery in stable condition and with appropriate post procedure care. Estimated blood loss is zero unless otherwise noted in this procedure report.  FINIDNGS: The esophagus revealed a large 5 cm hiatal hernia.  No evidnce of Cameron's lesions.  The gastric lumen was normal.  In the distal portion of the duodenal bulb a very small erosion was identified.  No evidence of any surrounding inflammation.  Two passes were performed of the small bowel with deep intubation into the proximal jejunum.  No evidence of any ulcerations, erosions, polyps, masses, or vascular abnormalities.  The mucosa was normal no evidence to suggest Celiac disease.  The small duodenal ulceration identified was not enough to be a convincing source for her anemia.    ADVERSE EVENTS:      There were no immediate complications.  IMPRESSIONS:     1) 5 cm hiatal hernia. 2) Small duodenal erosion.  RECOMMENDATIONS:     1) Proceed with the colonoscopy. RECALL:  _____________________________ Jeani HawkingPatrick Trenden Hazelrigg, MD eSigned:  Jeani HawkingPatrick Caffie Sotto, MD 06/13/2015 4:28 PM   cc:     PATIENT NAME:  Emily Holland, Emily Holland MR#: [id number]

## 2015-06-13 NOTE — Progress Notes (Signed)
[facility] PROGRESS NOTE  Emily Holland WUJ:[id number] DOB: 04-Mar-1961 DOA: 06/11/2015 PCP: [name], MD  Brief narrative 55 year old African female on allopurinol and colchicine, also reports taking multiple Advil over-the-counter (up to 4-5 tablets a day for a few months) was seen by her PCP for symptomatically anemia including fatigue and dyspnea on exertion for the past 2 weeks. Her PCP found that her hemoglobin was low and sent to the hospital. Denies any hematemesis, melena or vaginal bleed. She is also on full dose of aspirin. Patient on presentation had a hemoglobin of 4.8 (baseline hemoglobin of 11-12). FOBT was positive .Admitted to hospitalist service and GI consulted.   Assessment/Plan: Acute symptomatic blood loss anemia due to GI bleed Hemoglobin improved to 7.8 after 3 unit PRBC. GI consulted and patient scheduled for EGD and colonoscopy this afternoon (had normal colonoscopy in 2011). Symptoms likely due to NSAIDs. Continue PPI  Dyspnea on exertion Secondary to symptomatic anemia. Symptoms improved after transfusion.  Gout Stable. Continue allopurinol.  CODE STATUS: Full code  DVT prophylaxis: SCDs Diet: Nothing by mouth for procedure  Code Status: Full code Family Communication: None at bedside Disposition Plan: Home possibly in 1-2 days.   Consultants:  GI Dr. [name]  Procedures:  EGD/colonoscopy  Antibiotics:  None  HPI/Subjective: Seen and examined. Denies further dyspnea.  Objective: Filed Vitals:   06/13/15 1505 06/13/15 1510  BP: 99/57 105/67  Pulse: 74 82  Temp:    Resp: 14 18    Intake/Output Summary (Last 24 hours) at 06/13/15 1534 Last data filed at 06/13/15 0730  Gross per 24 hour  Intake   2146 ml  Output      0 ml  Net   2146 ml   Filed Weights   06/11/15 2358 06/13/15 1336  Weight: 128.6 kg (283 lb 8.2 oz) 128.368 kg (283 lb)    Exam:   General:  Middle aged obese female not in  distress  HEENT: Pallor present, moist mucosa  Chest: Clear bilaterally  CVS: Normal S1 and S2, no murmurs  GI: Soft, nondistended, nontender, bowel sounds present  Musculoskeletal: Warm, no edema  Data Reviewed: Basic Metabolic Panel:  Recent Labs Lab 06/11/15 1740 06/12/15 0903 06/13/15 0403  NA 143 141 146*  K 3.9 3.9 4.1  CL 103 102 107  CO2 30 32 29  GLUCOSE 99 92 96  BUN 12 9 9   CREATININE 0.75 0.62 0.73  CALCIUM 8.9 8.4* 8.6*   Liver Function Tests:  Recent Labs Lab 06/11/15 1740  AST 20  ALT 14  ALKPHOS 68  BILITOT 0.4  PROT 7.8  ALBUMIN 3.8   No results for input(s): LIPASE, AMYLASE in the last 168 hours. No results for input(s): AMMONIA in the last 168 hours. CBC:  Recent Labs Lab 06/11/15 1740 06/12/15 0903 06/12/15 1600 06/13/15 0403  WBC 8.4 6.8  --  9.7  HGB 4.8* 7.8* 7.8* 7.4*  HCT 18.1* 25.8* 26.3* 25.4*  MCV 70.4* 75.7*  --  75.1*  PLT 399 303  --  293   Cardiac Enzymes: No results for input(s): CKTOTAL, CKMB, CKMBINDEX, TROPONINI in the last 168 hours. BNP (last 3 results) No results for input(s): BNP in the last 8760 hours.  ProBNP (last 3 results) No results for input(s): PROBNP in the last 8760 hours.  CBG: No results for input(s): GLUCAP in the last 168 hours.  No results found for this or any previous visit (from the past 240 hour(s)).   Studies: No  results found.  Scheduled Meds: . allopurinol  300 mg Oral Daily  . Influenza vac split quadrivalent PF  0.5 mL Intramuscular Tomorrow-1000  . methocarbamol  750 mg Oral BID  . olopatadine  1 drop Both Eyes BID  . pantoprazole (PROTONIX) IV  40 mg Intravenous Q12H  . sodium chloride  3 mL Intravenous Q12H   Continuous Infusions:   Principal Problem:   Acute blood loss anemia    Time spent: 25 MINUTES    Emily Holland  [facility] Pager [phone]. If 7PM-7AM, please contact night-coverage at [email or web address], password [email or web address] 06/13/2015, 3:34 PM  LOS: 2  days

## 2015-06-13 NOTE — H&P (View-Only) (Signed)
Reason for Consult: Severe anemia with blood in stool. Referring Physician: THP.  Emily Holland is an 55 y.o. female.  HPI: 55 year old black female, admitted to [facility] with worsening shortness of breath and fatigue. Found to have a hemoglobin of 4.8 gm/dl on admission. Patient gives a history of rectal bleeding alternating with melenic stools for the last 4-5 months. She denies having any abdominal pain, nausea or vomiting, dysphagia or odynophagia. She has a good appetite and her weight has been stable. She had a normal colonoscopy in 2011 done by Dr. Hung. She has gout and take Colchicine and Allopurinol on a daily basis. It is not clear if she is taking NSAIDS or not.   Past Medical History  Diagnosis Date  . Gout   .  Internal and external hemorrhoids   .  Morbid obesity 06/03/11   Past Surgical History  Procedure Laterality Date  . Vaginal hysterectomy  ~ 2005  . Cervical conization w/bx  08/2004  . Abdominal hysterectomy     History reviewed. No pertinent family history.  Social History:  reports that she has never smoked. She does not have any smokeless tobacco history on file. She reports that she does not drink alcohol or use illicit drugs.  Allergies: No Known Allergies  Medications: I have reviewed the patient's current medications.  Results for orders placed or performed during the hospital encounter of 06/11/15 (from the past 48 hour(s))  Comprehensive metabolic panel     Status: None   Collection Time: 06/11/15  5:40 PM  Result Value Ref Range   Sodium 143 135 - 145 mmol/L   Potassium 3.9 3.5 - 5.1 mmol/L   Chloride 103 101 - 111 mmol/L   CO2 30 22 - 32 mmol/L   Glucose, Bld 99 65 - 99 mg/dL   BUN 12 6 - 20 mg/dL   Creatinine, Ser 0.75 0.44 - 1.00 mg/dL   Calcium 8.9 8.9 - 10.3 mg/dL   Total Protein 7.8 6.5 - 8.1 g/dL   Albumin 3.8 3.5 - 5.0 g/dL   AST 20 15 - 41 U/L   ALT 14 14 - 54 U/L   Alkaline Phosphatase 68 38 - 126 U/L   Total Bilirubin 0.4 0.3 -  1.2 mg/dL   GFR calc non Af Amer >60 >60 mL/min   GFR calc Af Amer >60 >60 mL/min    Comment: (NOTE) The eGFR has been calculated using the CKD EPI equation. This calculation has not been validated in all clinical situations. eGFR's persistently <60 mL/min signify possible Chronic Kidney Disease.    Anion gap 10 5 - 15  CBC     Status: Abnormal   Collection Time: 06/11/15  5:40 PM  Result Value Ref Range   WBC 8.4 4.0 - 10.5 K/uL   RBC 2.57 (L) 3.87 - 5.11 MIL/uL   Hemoglobin 4.8 (LL) 12.0 - 15.0 g/dL    Comment: REPEATED TO VERIFY CRITICAL RESULT CALLED TO, READ BACK BY AND VERIFIED WITH: JENNIFER HAMILTON RN 1.3.17 @ 1835 BY RICEJ    HCT 18.1 (L) 36.0 - 46.0 %   MCV 70.4 (L) 78.0 - 100.0 fL   MCH 18.7 (L) 26.0 - 34.0 pg   MCHC 26.5 (L) 30.0 - 36.0 g/dL   RDW 19.4 (H) 11.5 - 15.5 %   Platelets 399 150 - 400 K/uL  ABO/Rh     Status: None   Collection Time: 06/11/15  5:40 PM  Result Value Ref Range     ABO/RH(D) B POS   Type and screen [facility]     Status: None (Preliminary result)   Collection Time: 06/11/15  5:41 PM  Result Value Ref Range   ABO/RH(D) B POS    Antibody Screen NEG    Sample Expiration 06/14/2015    Unit Number [id number]    Blood Component Type RED CELLS,LR    Unit division 00    Status of Unit ISSUED    Transfusion Status OK TO TRANSFUSE    Crossmatch Result Compatible    Unit Number [id number]    Blood Component Type RED CELLS,LR    Unit division 00    Status of Unit ISSUED,FINAL    Transfusion Status OK TO TRANSFUSE    Crossmatch Result Compatible    Unit Number [id number]    Blood Component Type RED CELLS,LR    Unit division 00    Status of Unit ISSUED    Transfusion Status OK TO TRANSFUSE    Crossmatch Result Compatible   Prepare RBC     Status: None   Collection Time: 06/11/15  7:00 PM  Result Value Ref Range   Order Confirmation ORDER PROCESSED BY BLOOD BANK   POC occult blood, ED     Status: None    Collection Time: 06/11/15  9:03 PM  Result Value Ref Range   Fecal Occult Bld NEGATIVE NEGATIVE  Basic metabolic panel     Status: Abnormal   Collection Time: 06/12/15  9:03 AM  Result Value Ref Range   Sodium 141 135 - 145 mmol/L   Potassium 3.9 3.5 - 5.1 mmol/L   Chloride 102 101 - 111 mmol/L   CO2 32 22 - 32 mmol/L   Glucose, Bld 92 65 - 99 mg/dL   BUN 9 6 - 20 mg/dL   Creatinine, Ser 0.62 0.44 - 1.00 mg/dL   Calcium 8.4 (L) 8.9 - 10.3 mg/dL   GFR calc non Af Amer >60 >60 mL/min   GFR calc Af Amer >60 >60 mL/min    Comment: (NOTE) The eGFR has been calculated using the CKD EPI equation. This calculation has not been validated in all clinical situations. eGFR's persistently <60 mL/min signify possible Chronic Kidney Disease.    Anion gap 7 5 - 15  CBC     Status: Abnormal   Collection Time: 06/12/15  9:03 AM  Result Value Ref Range   WBC 6.8 4.0 - 10.5 K/uL   RBC 3.41 (L) 3.87 - 5.11 MIL/uL   Hemoglobin 7.8 (L) 12.0 - 15.0 g/dL    Comment: REPEATED TO VERIFY DELTA CHECK NOTED POST TRANSFUSION SPECIMEN    HCT 25.8 (L) 36.0 - 46.0 %   MCV 75.7 (L) 78.0 - 100.0 fL    Comment: REPEATED TO VERIFY DELTA CHECK NOTED POST TRANSFUSION SPECIMEN    MCH 22.9 (L) 26.0 - 34.0 pg   MCHC 30.2 30.0 - 36.0 g/dL   RDW 20.2 (H) 11.5 - 15.5 %   Platelets 303 150 - 400 K/uL   Review of Systems  Constitutional: Negative.   Eyes: Negative.   Respiratory: Positive for shortness of breath.   Cardiovascular: Negative.   Gastrointestinal: Positive for constipation, blood in stool and melena. Negative for heartburn, nausea, vomiting and abdominal pain.  Musculoskeletal: Negative.   Skin: Negative.   Neurological: Negative.   Endo/Heme/Allergies: Negative.   Psychiatric/Behavioral: Negative.    Blood pressure 98/47, pulse 71, temperature 97.8 F (36.6 C), temperature source Oral, resp. rate 18, height 5'   6" (1.676 m), weight 128.6 kg (283 lb 8.2 oz), SpO2 98 %. Physical Exam   Constitutional: She is oriented to person, place, and time. She appears well-developed and well-nourished.  HENT:  Head: Normocephalic and atraumatic.  Eyes: Conjunctivae and EOM are normal. Pupils are equal, round, and reactive to light.  Neck: Normal range of motion. Neck supple.  Cardiovascular: Normal rate and regular rhythm.   Respiratory: Breath sounds normal.  GI: Soft. Bowel sounds are normal.  Neurological: She is alert and oriented to person, place, and time.  Skin: Skin is warm and dry.  Psychiatric: She has a normal mood and affect. Her behavior is normal. Judgment and thought content normal.   Assessment/Plan: 1) Severe anemia with blood in stool: will proceed with an EGD/Colonoscopy at this time. Will prep tonite.  2) Gout. 3) Obesity.  MANN,JYOTHI 06/12/2015, 12:23 PM      

## 2015-06-14 ENCOUNTER — Encounter (HOSPITAL_COMMUNITY): Payer: Self-pay | Admitting: Gastroenterology

## 2015-06-14 ENCOUNTER — Encounter (HOSPITAL_COMMUNITY)
Admission: EM | Disposition: A | Discharge status: Home / Self Care | Payer: Self-pay | Source: Home / Self Care | Attending: Internal Medicine

## 2015-06-14 DIAGNOSIS — D509 Iron deficiency anemia, unspecified: Secondary | ICD-10-CM

## 2015-06-14 HISTORY — PX: GIVENS CAPSULE STUDY: SHX5432

## 2015-06-14 LAB — HEMOGLOBIN AND HEMATOCRIT, BLOOD
HCT: 25.8 % — ABNORMAL LOW (ref 36.0–46.0)
HCT: 28.5 % — ABNORMAL LOW (ref 36.0–46.0)
Hemoglobin: 7.4 g/dL — ABNORMAL LOW (ref 12.0–15.0)
Hemoglobin: 8.3 g/dL — ABNORMAL LOW (ref 12.0–15.0)

## 2015-06-14 SURGERY — IMAGING PROCEDURE, GI TRACT, INTRALUMINAL, VIA CAPSULE
Anesthesia: LOCAL

## 2015-06-14 SURGICAL SUPPLY — 1 items: TOWEL COTTON PACK 4EA (MISCELLANEOUS) ×4 IMPLANT

## 2015-06-14 NOTE — Consult Note (Signed)
   [facility]   06/14/2015  Emily Holland 12/23/1960 [id number]   Came to visit patient at bedside on behalf of [facility] for [facility] employees/dependents with [facility]. Discussed Link to Wellness at bedside. Patient denies having any Link to Wellness needs. Appreciative of visit however. Left contact and brochure information at bedside. Declines post hospital telephone call. Made inpatient RNCM aware of visit.   Raiford NobleAtika Hall, MSN-Ed, RN,BSN [facility] Hospital Liaison [phone]

## 2015-06-14 NOTE — Care Management Note (Signed)
Case Management Note  Patient Details  Name: Emily Holland MRN: [id number] Date of Birth: 11/26/1960  Subjective/Objective:        GIB            Action/Plan:  From home plan to return home with no needs at present time.    Expected Discharge Date:                  Expected Discharge Plan:  Home/Self Care  In-House Referral:     Discharge planning Services  CM Consult  Post Acute Care Choice:    Choice offered to:     DME Arranged:    DME Agency:     HH Arranged:    HH Agency:     Status of Service:  In process, will continue to follow  Medicare Important Message Given:    Date Medicare IM Given:    Medicare IM give by:    Date Additional Medicare IM Given:    Additional Medicare Important Message give by:     If discussed at Long Length of Stay Meetings, dates discussed:    Additional CommentsGeni Bers:  Emily Dipinto, RN 06/14/2015, 3:31 PM

## 2015-06-14 NOTE — Progress Notes (Signed)
Subjective: No complaints.  Currently undergoing the capsule endoscopy.  Objective: Vital signs in last 24 hours: Temp:  [97.5 F (36.4 C)-98.5 F (36.9 C)] 98.4 F (36.9 C) (01/06 0445) Pulse Rate:  [68-102] 86 (01/06 0445) Resp:  [11-22] 16 (01/06 0445) BP: (89-150)/(41-96) 112/59 mmHg (01/06 0445) SpO2:  [90 %-100 %] 100 % (01/06 0445) Weight:  [128.368 kg (283 lb)] 128.368 kg (283 lb) (01/05 1336) Last BM Date: 06/12/15  Intake/Output from previous day: 01/05 0701 - 01/06 0700 In: 1184 [P.O.:880; I.V.:304] Out: -  Intake/Output this shift:    General appearance: alert and no distress GI: soft, non-tender; bowel sounds normal; no masses,  no organomegaly  Lab Results:  Recent Labs  06/11/15 1740 06/12/15 0903  06/13/15 0403 06/13/15 1707 06/14/15 0425  WBC 8.4 6.8  --  9.7  --   --   HGB 4.8* 7.8*  < > 7.4* 8.3* 7.4*  HCT 18.1* 25.8*  < > 25.4* 28.8* 25.8*  PLT 399 303  --  293  --   --   < > = values in this interval not displayed. BMET  Recent Labs  06/11/15 1740 06/12/15 0903 06/13/15 0403  NA 143 141 146*  K 3.9 3.9 4.1  CL 103 102 107  CO2 30 32 29  GLUCOSE 99 92 96  BUN 12 9 9   CREATININE 0.75 0.62 0.73  CALCIUM 8.9 8.4* 8.6*   LFT  Recent Labs  06/11/15 1740  PROT 7.8  ALBUMIN 3.8  AST 20  ALT 14  ALKPHOS 68  BILITOT 0.4   PT/INR No results for input(s): LABPROT, INR in the last 72 hours. Hepatitis Panel No results for input(s): HEPBSAG, HCVAB, HEPAIGM, HEPBIGM in the last 72 hours. C-Diff No results for input(s): CDIFFTOX in the last 72 hours. Fecal Lactopherrin No results for input(s): FECLLACTOFRN in the last 72 hours.  Studies/Results: No results found.  Medications:  Scheduled: . allopurinol  300 mg Oral Daily  . methocarbamol  750 mg Oral BID  . olopatadine  1 drop Both Eyes BID  . pantoprazole (PROTONIX) IV  40 mg Intravenous Q12H  . sodium chloride  3 mL Intravenous Q12H   Continuous:   Assessment/Plan: 1)  Severe IDA - ? Source.   She is well at this time.  Her HGB is stable in the 7 range.  The capsule will not be complete until tomorrow.  She can be discharged home if her HGB is stable.  I will read the capsule study Monday or Tuesday next week.   LOS: 3 days   Jovan Schickling D 06/14/2015, 11:36 AM

## 2015-06-14 NOTE — Progress Notes (Signed)
[facility] PROGRESS NOTE  Emily Holland ZOX:[id number] DOB: 1961-03-30 DOA: 06/11/2015 PCP: [name], MD  Brief narrative 55 year old African female on allopurinol and colchicine, also reports taking multiple Advil over-the-counter (up to 4-5 tablets a day for a few months) was seen by her PCP for symptomatically anemia including fatigue and dyspnea on exertion for the past 2 weeks. Her PCP found that her hemoglobin was low and sent to the hospital. Patient had normal EGD and colonoscopy in 2011. Denies any hematemesis, melena or vaginal bleed. She is also on full dose of aspirin. Patient on presentation had a hemoglobin of 4.8 (baseline hemoglobin of 11-12). FOBT was positive .had severe iron deficiency anemia. Admitted to hospitalist service and GI consulted.    Assessment/Plan: Acute symptomatic blood loss anemia due to GI bleed Hemoglobin improved to 7.8 after 3 unit PRBC. GI consulted and patient underwent EGD and colonoscopy on 1/5. She had a normal colonoscopy with EGD showing 5 cm hiatal hernia and small duodenal erosion. Capsule study performed and will be completed on 1/7. GI recommends that she could be discharged home if her hemoglobin is stable after capsule is completed. Dr. Elnoria Howard will read the capsule study sometime early next week.  Continue PPI.  -Check hemoglobin in a.m and transfuse  if low.  Dyspnea on exertion Secondary to symptomatic anemia. Symptoms improved after transfusion.  Gout Stable. Continue allopurinol.  CODE STATUS: Full code  DVT prophylaxis: SCDs Diet: Regular  Code Status: Full code Family Communication: None at bedside Disposition Plan: Home tomorrow after capsule study completed   Consultants:  GI Dr. [name]  Procedures:  EGD/colonoscopy  Antibiotics:  None  HPI/Subjective: Seen and examined. Denies any shortness of breath.  Objective: Filed Vitals:   06/13/15 2133 06/14/15 0445  BP: 119/66 112/59  Pulse: 71  86  Temp: 98.4 F (36.9 C) 98.4 F (36.9 C)  Resp: 20 16    Intake/Output Summary (Last 24 hours) at 06/14/15 1342 Last data filed at 06/14/15 0700  Gross per 24 hour  Intake    704 ml  Output      0 ml  Net    704 ml   Filed Weights   06/11/15 2358 06/13/15 1336  Weight: 128.6 kg (283 lb 8.2 oz) 128.368 kg (283 lb)    Exam:   General:  Middle aged obese female not in distress  HEENT: Pallor present, moist mucosa  Chest: Clear bilaterally  CVS: Normal S1 and S2,   GI: Soft, nondistended, nontender,  Musculoskeletal: Warm, no edema  Data Reviewed: Basic Metabolic Panel:  Recent Labs Lab 06/11/15 1740 06/12/15 0903 06/13/15 0403  NA 143 141 146*  K 3.9 3.9 4.1  CL 103 102 107  CO2 30 32 29  GLUCOSE 99 92 96  BUN 12 9 9   CREATININE 0.75 0.62 0.73  CALCIUM 8.9 8.4* 8.6*   Liver Function Tests:  Recent Labs Lab 06/11/15 1740  AST 20  ALT 14  ALKPHOS 68  BILITOT 0.4  PROT 7.8  ALBUMIN 3.8   No results for input(s): LIPASE, AMYLASE in the last 168 hours. No results for input(s): AMMONIA in the last 168 hours. CBC:  Recent Labs Lab 06/11/15 1740 06/12/15 0903 06/12/15 1600 06/13/15 0403 06/13/15 1707 06/14/15 0425  WBC 8.4 6.8  --  9.7  --   --   HGB 4.8* 7.8* 7.8* 7.4* 8.3* 7.4*  HCT 18.1* 25.8* 26.3* 25.4* 28.8* 25.8*  MCV 70.4* 75.7*  --  75.1*  --   --  PLT 399 303  --  293  --   --    Cardiac Enzymes: No results for input(s): CKTOTAL, CKMB, CKMBINDEX, TROPONINI in the last 168 hours. BNP (last 3 results) No results for input(s): BNP in the last 8760 hours.  ProBNP (last 3 results) No results for input(s): PROBNP in the last 8760 hours.  CBG: No results for input(s): GLUCAP in the last 168 hours.  No results found for this or any previous visit (from the past 240 hour(s)).   Studies: No results found.  Scheduled Meds: . allopurinol  300 mg Oral Daily  . methocarbamol  750 mg Oral BID  . olopatadine  1 drop Both Eyes BID   . pantoprazole (PROTONIX) IV  40 mg Intravenous Q12H  . sodium chloride  3 mL Intravenous Q12H   Continuous Infusions:   Principal Problem:   Acute blood loss anemia    Time spent: 25 MINUTES    Emily Holland  [facility] Pager [phone]. If 7PM-7AM, please contact night-coverage at [email or web address], password [email or web address] 06/14/2015, 1:42 PM  LOS: 3 days

## 2015-06-15 DIAGNOSIS — K922 Gastrointestinal hemorrhage, unspecified: Secondary | ICD-10-CM | POA: Diagnosis present

## 2015-06-15 DIAGNOSIS — D62 Acute posthemorrhagic anemia: Secondary | ICD-10-CM

## 2015-06-15 LAB — HEMOGLOBIN AND HEMATOCRIT, BLOOD
HCT: 27.3 % — ABNORMAL LOW (ref 36.0–46.0)
Hemoglobin: 7.7 g/dL — ABNORMAL LOW (ref 12.0–15.0)

## 2015-06-15 MED ORDER — PANTOPRAZOLE SODIUM 40 MG PO TBEC: 40.0000 mg | DELAYED_RELEASE_TABLET | Freq: Every day | ORAL | Status: DC

## 2015-06-15 MED ORDER — TRAMADOL HCL 50 MG PO TABS: 50.0000 mg | ORAL_TABLET | Freq: Four times a day (QID) | ORAL | Status: DC | PRN

## 2015-06-15 NOTE — Discharge Summary (Addendum)
Physician Discharge Summary  Emily Holland FAO:[id number] DOB: September 02, 1960 DOA: 06/11/2015  PCP: [name], MD  Admit date: 06/11/2015 Discharge date: 06/15/2015  Time spent: < 30 minutes  Recommendations for Outpatient Follow-up:  1. Follow up with Dr. Elnoria Howard in 1-2 weeks 2. Follow up with Dr. Concepcion Elk as needed in 2-4 weeks   Discharge Diagnoses:  Principal Problem:   Acute blood loss anemia Active Problems:   GI bleeding  Discharge Condition: stable  Diet recommendation: regular  Filed Weights   06/11/15 2358 06/13/15 1336  Weight: 128.6 kg (283 lb 8.2 oz) 128.368 kg (283 lb)    History of present illness:  See H&P, Labs, Consult and Test reports for all details in brief, patient is a 55 year old African female on allopurinol and colchicine, also reports taking multiple Advil over-the-counter (up to 4-5 tablets a day for a few months) was seen by her PCP for symptomatically anemia including fatigue and dyspnea on exertion for the past 2 weeks, admitted for GI evaluation  Hospital Course:   Acute symptomatic blood loss anemia due to GI bleed - Hemoglobin improved to 7.8 after 3 unit PRBC and has remained stable without further need for transfusions. GI consulted and patient underwent EGD and colonoscopy on 1/5. She had a normal colonoscopy with EGD showing 5 cm hiatal hernia and small duodenal erosion. Capsule study performed and was completed on 1/7. GI recommends that she could be discharged home if her hemoglobin is stable after capsule is completed. Dr. Elnoria Howard will read the capsule study sometime early next week. Continue PPI.  Dyspnea on exertion - secondary to symptomatic anemia. Symptoms improved after transfusion. Gout - Stable. Continue allopurinol.  Procedures:  EGD/Colonoscopy    Consultations:  Gastroenterology   Discharge Exam: Filed Vitals:   06/14/15 0445 06/14/15 1418 06/14/15 2223 06/15/15 0608  BP: 112/59 132/62 122/50 101/52  Pulse: 86 74 72  71  Temp: 98.4 F (36.9 C) 98.4 F (36.9 C) 98.4 F (36.9 C) 98.6 F (37 C)  TempSrc: Oral Oral Oral Oral  Resp: 16 20 16 18   Height:      Weight:      SpO2: 100% 98% 100% 96%    General: NAD Cardiovascular: RRR Respiratory: CTA biL  Discharge Instructions Activity:  As tolerated   Get Medicines reviewed and adjusted: Please take all your medications with you for your next visit with your Primary MD  Please request your Primary MD to go over all hospital tests and procedure/radiological results at the follow up, please ask your Primary MD to get all Hospital records sent to his/her office.  If you experience worsening of your admission symptoms, develop shortness of breath, life threatening emergency, suicidal or homicidal thoughts you must seek medical attention immediately by calling 911 or calling your MD immediately if symptoms less severe.  You must read complete instructions/literature along with all the possible adverse reactions/side effects for all the Medicines you take and that have been prescribed to you. Take any new Medicines after you have completely understood and accpet all the possible adverse reactions/side effects.   Do not drive when taking Pain medications.   Do not take more than prescribed Pain, Sleep and Anxiety Medications  Special Instructions: If you have smoked or chewed Tobacco in the last 2 yrs please stop smoking, stop any regular Alcohol and or any Recreational drug use.  Wear Seat belts while driving.  Please note  You were cared for by a hospitalist during your hospital stay.  Once you are discharged, your primary care physician will handle any further medical issues. Please note that NO REFILLS for any discharge medications will be authorized once you are discharged, as it is imperative that you return to your primary care physician (or establish a relationship with a primary care physician if you do not have one) for your aftercare needs  so that they can reassess your need for medications and monitor your lab values.    Medication List    STOP taking these medications        aspirin 325 MG EC tablet     ibuprofen 200 MG tablet  Commonly known as:  ADVIL,MOTRIN     ibuprofen 400 MG tablet  Commonly known as:  ADVIL,MOTRIN      TAKE these medications        albuterol 108 (90 Base) MCG/ACT inhaler  Commonly known as:  PROVENTIL HFA;VENTOLIN HFA  Inhale 1-2 puffs into the lungs every 4 (four) hours as needed for wheezing or shortness of breath.     allopurinol 300 MG tablet  Commonly known as:  ZYLOPRIM  Take 300 mg by mouth daily.     calcium carbonate 500 MG chewable tablet  Commonly known as:  TUMS - dosed in mg elemental calcium  Chew 1 tablet by mouth as needed for indigestion or heartburn.     colchicine 0.6 MG tablet  Take 0.6 mg by mouth daily as needed (gout).     diphenhydrAMINE 25 MG tablet  Commonly known as:  BENADRYL  Take 25 mg by mouth every 6 (six) hours as needed. For allergies     fexofenadine-pseudoephedrine 60-120 MG 12 hr tablet  Commonly known as:  ALLEGRA-D  Take 1 tablet by mouth 2 (two) times daily as needed (for allergies).     Glucosamine-Chondroitin 750-600 MG Tabs  Take 1 tablet by mouth 2 (two) times daily.     methocarbamol 750 MG tablet  Commonly known as:  ROBAXIN  Take 750 mg by mouth 2 (two) times daily.     NOREL AD 4-10-325 MG Tabs  Generic drug:  Chlorphen-PE-Acetaminophen  Take 1 tablet by mouth 2 (two) times daily as needed (cold symptoms).     Olopatadine HCl 0.2 % Soln  Place 1 drop into both eyes daily as needed (for allergies).     pantoprazole 40 MG tablet  Commonly known as:  PROTONIX  Take 1 tablet (40 mg total) by mouth daily.     traMADol 50 MG tablet  Commonly known as:  ULTRAM  Take 1 tablet (50 mg total) by mouth every 6 (six) hours as needed.     Vitamin C Powd  Take 1 packet by mouth daily.     Vitamin D (Ergocalciferol) 50000 units  Caps capsule  Commonly known as:  DRISDOL  Take 50,000 Units by mouth every 7 (seven) days.       Follow-up Information    Follow up with AVBUERE,EDWIN A, MD. Schedule an appointment as soon as possible for a visit in 2 weeks.   Specialty:  Internal Medicine   Contact information:   [address] [place] [address] [phone]       Follow up with HUNG,PATRICK D, MD. Schedule an appointment as soon as possible for a visit in 1 week.   Specialty:  Gastroenterology   Contact information:   [address], [address] [place] [address] [phone]       The results of significant diagnostics from this hospitalization (  including imaging, microbiology, ancillary and laboratory) are listed below for reference.    Significant Diagnostic Studies: Mm Digital Screening Bilateral  05/23/2015  CLINICAL DATA:  Screening. EXAM: DIGITAL SCREENING BILATERAL MAMMOGRAM WITH CAD COMPARISON:  Previous exam(s). ACR Breast Density Category b: There are scattered areas of fibroglandular density. FINDINGS: There are no findings suspicious for malignancy. Images were processed with CAD. IMPRESSION: No mammographic evidence of malignancy. A result letter of this screening mammogram will be mailed directly to the patient. RECOMMENDATION: Screening mammogram in one year. (Code:SM-B-01Y) BI-RADS CATEGORY  1: Negative. Electronically Signed   By: Amie Portland M.D.   On: 05/23/2015 13:36   Labs: Basic Metabolic Panel:  Recent Labs Lab 06/11/15 1740 06/12/15 0903 06/13/15 0403  NA 143 141 146*  K 3.9 3.9 4.1  CL 103 102 107  CO2 30 32 29  GLUCOSE 99 92 96  BUN 12 9 9   CREATININE 0.75 0.62 0.73  CALCIUM 8.9 8.4* 8.6*   Liver Function Tests:  Recent Labs Lab 06/11/15 1740  AST 20  ALT 14  ALKPHOS 68  BILITOT 0.4  PROT 7.8  ALBUMIN 3.8   CBC:  Recent Labs Lab 06/11/15 1740 06/12/15 0903  06/13/15 0403 06/13/15 1707 06/14/15 0425 06/14/15 1632 06/15/15 0411  WBC 8.4  6.8  --  9.7  --   --   --   --   HGB 4.8* 7.8*  < > 7.4* 8.3* 7.4* 8.3* 7.7*  HCT 18.1* 25.8*  < > 25.4* 28.8* 25.8* 28.5* 27.3*  MCV 70.4* 75.7*  --  75.1*  --   --   --   --   PLT 399 303  --  293  --   --   --   --   < > = values in this interval not displayed.    SignedPamella Pert  [facility] 06/15/2015, 12:55 PM

## 2015-06-15 NOTE — Discharge Instructions (Signed)
Follow with AVBUERE,EDWIN A, MD in 5-7 days ° °Please get a complete blood count and chemistry panel checked by your Primary MD at your next visit, and again as instructed by your Primary MD. Please get your medications reviewed and adjusted by your Primary MD. ° °Please request your Primary MD to go over all Hospital Tests and Procedure/Radiological results at the follow up, please get all Hospital records sent to your Prim MD by signing hospital release before you go home. ° °If you had Pneumonia of Lung problems at the Hospital: °Please get a 2 view Chest X ray done in 6-8 weeks after hospital discharge or sooner if instructed by your Primary MD. ° °If you have Congestive Heart Failure: °Please call your Cardiologist or Primary MD anytime you have any of the following symptoms:  °1) 3 pound weight gain in 24 hours or 5 pounds in 1 week  °2) shortness of breath, with or without a dry hacking cough  °3) swelling in the hands, feet or stomach  °4) if you have to sleep on extra pillows at night in order to breathe ° °Follow cardiac low salt diet and 1.5 lit/day fluid restriction. ° °If you have diabetes °Accuchecks 4 times/day, Once in AM empty stomach and then before each meal. °Log in all results and show them to your primary doctor at your next visit. °If any glucose reading is under 80 or above 300 call your primary MD immediately. ° °If you have Seizure/Convulsions/Epilepsy: °Please do not drive, operate heavy machinery, participate in activities at heights or participate in high speed sports until you have seen by Primary MD or a Neurologist and advised to do so again. ° °If you had Gastrointestinal Bleeding: °Please ask your Primary MD to check a complete blood count within one week of discharge or at your next visit. Your endoscopic/colonoscopic biopsies that are pending at the time of discharge, will also need to followed by your Primary MD. ° °Get Medicines reviewed and adjusted. °Please take all your  medications with you for your next visit with your Primary MD ° °Please request your Primary MD to go over all hospital tests and procedure/radiological results at the follow up, please ask your Primary MD to get all Hospital records sent to his/her office. ° °If you experience worsening of your admission symptoms, develop shortness of breath, life threatening emergency, suicidal or homicidal thoughts you must seek medical attention immediately by calling 911 or calling your MD immediately  if symptoms less severe. ° °You must read complete instructions/literature along with all the possible adverse reactions/side effects for all the Medicines you take and that have been prescribed to you. Take any new Medicines after you have completely understood and accpet all the possible adverse reactions/side effects.  ° °Do not drive or operate heavy machinery when taking Pain medications.  ° °Do not take more than prescribed Pain, Sleep and Anxiety Medications ° °Special Instructions: If you have smoked or chewed Tobacco  in the last 2 yrs please stop smoking, stop any regular Alcohol  and or any Recreational drug use. ° °Wear Seat belts while driving. ° °Please note °You were cared for by a hospitalist during your hospital stay. If you have any questions about your discharge medications or the care you received while you were in the hospital after you are discharged, you can call the unit and asked to speak with the hospitalist on call if the hospitalist that took care of you is not available. Once   you are discharged, your primary care physician will handle any further medical issues. Please note that NO REFILLS for any discharge medications will be authorized once you are discharged, as it is imperative that you return to your primary care physician (or establish a relationship with a primary care physician if you do not have one) for your aftercare needs so that they can reassess your need for medications and monitor your  lab values. ° °You can reach the hospitalist office at phone [phone] or fax [phone] °  °If you do not have a primary care physician, you can call [phone] for a physician referral. ° °Activity: As tolerated with Full fall precautions use walker/cane & assistance as needed ° °Diet: regular ° °Disposition Home  ° ° °

## 2015-06-15 NOTE — Progress Notes (Signed)
Reviewed discharge information with patient and caregiver. Answered all questions. Patient/caregiver able to teach back medications and reasons to contact MD/911. Patient verbalizes importance of PCP follow up appointment.   

## 2015-06-16 NOTE — ED Provider Notes (Signed)
CSN: [id number]     Arrival date & time 06/11/15  1651 History   First MD Initiated Contact with Patient 06/11/15 1726     Chief Complaint  Patient presents with  . Abnormal Lab      HPI Pt states that she was called by her PCP and told to come in as her HGB was low. Unknown number. States she has felt weak and SOB on exertion. Denies bleeding of any kind. Alert and oriented. Patient states that she had had melanotic stools over the last few weeks to months.  Patient did have a blood transfusion 16 years ago is unsure this source of her blood loss.  Patient has no known blood loss she is aware of.  Patient denies chest pain but has been very fatigued. Past Medical History  Diagnosis Date  . Gout   . No pertinent past medical history   . Shortness of breath on exertion 06/03/11   Past Surgical History  Procedure Laterality Date  . Vaginal hysterectomy  ~ 2005  . Cervical conization w/bx  08/2004  . Abdominal hysterectomy    . Esophagogastroduodenoscopy N/A 06/13/2015    Procedure: ESOPHAGOGASTRODUODENOSCOPY (EGD)/Colonoscopy;  Surgeon: Jeani HawkingPatrick Hung, MD;  Location: [facility];  Service: Endoscopy;  Laterality: N/A;  . Colonoscopy N/A 06/13/2015    Procedure: COLONOSCOPY;  Surgeon: Jeani HawkingPatrick Hung, MD;  Location: [facility];  Service: Endoscopy;  Laterality: N/A;   History reviewed. No pertinent family history. Social History  Substance Use Topics  . Smoking status: Never Smoker   . Smokeless tobacco: None  . Alcohol Use: No     Comment: Occassional   OB History    Gravida Para Term Preterm AB TAB SAB Ectopic Multiple Living   0 0 0 0 0 0 0 0       Review of Systems  All other systems reviewed and are negative  Allergies  Review of patient's allergies indicates no known allergies.  Home Medications   Prior to Admission medications   Medication Sig Start Date End Date Taking? Authorizing Provider  allopurinol (ZYLOPRIM) 300 MG tablet Take 300 mg by mouth daily.  05/16/15   Yes Historical Provider, MD  Ascorbic Acid (VITAMIN C) POWD Take 1 packet by mouth daily.   Yes Historical Provider, MD  calcium carbonate (TUMS - DOSED IN MG ELEMENTAL CALCIUM) 500 MG chewable tablet Chew 1 tablet by mouth as needed for indigestion or heartburn.   Yes Historical Provider, MD  Chlorphen-PE-Acetaminophen (NOREL AD) 4-10-325 MG TABS Take 1 tablet by mouth 2 (two) times daily as needed (cold symptoms).    Yes Historical Provider, MD  colchicine 0.6 MG tablet Take 0.6 mg by mouth daily as needed (gout).    Yes Historical Provider, MD  Glucosamine-Chondroitin 750-600 MG TABS Take 1 tablet by mouth 2 (two) times daily.   Yes Historical Provider, MD  methocarbamol (ROBAXIN) 750 MG tablet Take 750 mg by mouth 2 (two) times daily.  05/16/15  Yes Historical Provider, MD  Olopatadine HCl 0.2 % SOLN Place 1 drop into both eyes daily as needed (for allergies).   Yes Historical Provider, MD  albuterol (PROVENTIL HFA;VENTOLIN HFA) 108 (90 BASE) MCG/ACT inhaler Inhale 1-2 puffs into the lungs every 4 (four) hours as needed for wheezing or shortness of breath. 06/06/11 06/05/12  Alison MurrayAlma M Devine, MD  diphenhydrAMINE (BENADRYL) 25 MG tablet Take 25 mg by mouth every 6 (six) hours as needed. For allergies     Historical Provider, MD  fexofenadine-pseudoephedrine Elvera Maria(ALLEGRA-D)  60-120 MG per tablet Take 1 tablet by mouth 2 (two) times daily as needed (for allergies).    Historical Provider, MD  pantoprazole (PROTONIX) 40 MG tablet Take 1 tablet (40 mg total) by mouth daily. 06/15/15   Costin Otelia Sergeant, MD  traMADol (ULTRAM) 50 MG tablet Take 1 tablet (50 mg total) by mouth every 6 (six) hours as needed. 06/15/15   Costin Otelia Sergeant, MD  Vitamin D, Ergocalciferol, (DRISDOL) 50000 UNITS CAPS Take 50,000 Units by mouth every 7 (seven) days.     Historical Provider, MD   BP 101/52 mmHg  Pulse 71  Temp(Src) 98.6 F (37 C) (Oral)  Resp 18  Ht 5\' 6"  (1.676 m)  Wt 283 lb (128.368 kg)  BMI 45.70 kg/m2  SpO2 96%  LMP   (Exact Date) Physical Exam Physical Exam  Nursing note and vitals reviewed. Constitutional: She is oriented to person, place, and time. She appears well-developed and well-nourished. No distress.  HENT:  Head: Normocephalic and atraumatic.  Eyes: Pupils are equal, round, and reactive to light.  Neck: Normal range of motion.  Cardiovascular: Normal rate and intact distal pulses.   Pulmonary/Chest: No respiratory distress.  Abdominal: Normal appearance. She exhibits no distension.  Musculoskeletal: Normal range of motion.  Neurological: She is alert and oriented to person, place, and time. No cranial nerve deficit.  Skin: Skin is warm and dry. No rash noted.  Psychiatric: She has a normal mood and affect. Her behavior is normal.   ED Course  Procedures (including critical care time) Labs Review Labs Reviewed  CBC - Abnormal; Notable for the following:    RBC 2.57 (*)    Hemoglobin 4.8 (*)    HCT 18.1 (*)    MCV 70.4 (*)    MCH 18.7 (*)    MCHC 26.5 (*)    RDW 19.4 (*)    All other components within normal limits  BASIC METABOLIC PANEL - Abnormal; Notable for the following:    Calcium 8.4 (*)    All other components within normal limits  CBC - Abnormal; Notable for the following:    RBC 3.41 (*)    Hemoglobin 7.8 (*)    HCT 25.8 (*)    MCV 75.7 (*)    MCH 22.9 (*)    RDW 20.2 (*)    All other components within normal limits  HEMOGLOBIN AND HEMATOCRIT, BLOOD - Abnormal; Notable for the following:    Hemoglobin 7.8 (*)    HCT 26.3 (*)    All other components within normal limits  CBC - Abnormal; Notable for the following:    RBC 3.38 (*)    Hemoglobin 7.4 (*)    HCT 25.4 (*)    MCV 75.1 (*)    MCH 21.9 (*)    MCHC 29.1 (*)    RDW 20.3 (*)    All other components within normal limits  BASIC METABOLIC PANEL - Abnormal; Notable for the following:    Sodium 146 (*)    Calcium 8.6 (*)    All other components within normal limits  HEMOGLOBIN AND HEMATOCRIT, BLOOD -  Abnormal; Notable for the following:    Hemoglobin 8.3 (*)    HCT 28.8 (*)    All other components within normal limits  HEMOGLOBIN AND HEMATOCRIT, BLOOD - Abnormal; Notable for the following:    Hemoglobin 7.4 (*)    HCT 25.8 (*)    All other components within normal limits  HEMOGLOBIN AND HEMATOCRIT, BLOOD - Abnormal;  Notable for the following:    Hemoglobin 8.3 (*)    HCT 28.5 (*)    All other components within normal limits  HEMOGLOBIN AND HEMATOCRIT, BLOOD - Abnormal; Notable for the following:    Hemoglobin 7.7 (*)    HCT 27.3 (*)    All other components within normal limits  COMPREHENSIVE METABOLIC PANEL  POC OCCULT BLOOD, ED  TYPE AND SCREEN  PREPARE RBC (CROSSMATCH)  ABO/RH    Imaging Review No results found. I have personally reviewed and evaluated these images and lab results as part of my medical decision-making. Patient was transfused 2 units of packed red blood cells in emergency department and did needed for further evaluation of her anemia.   MDM   Final diagnoses:  Acute blood loss anemia        Nelva Nay, MD 06/16/15 [id number]

## 2015-06-17 ENCOUNTER — Encounter (HOSPITAL_COMMUNITY): Payer: Self-pay | Admitting: Gastroenterology

## 2015-06-18 DIAGNOSIS — D509 Iron deficiency anemia, unspecified: Secondary | ICD-10-CM | POA: Diagnosis not present

## 2015-06-18 DIAGNOSIS — K625 Hemorrhage of anus and rectum: Secondary | ICD-10-CM | POA: Diagnosis not present

## 2015-06-18 MED FILL — PANTOPRAZOLE SOD DR 40 MG T: 40 | 60 days supply | Qty: 60 | Fill #0

## 2015-06-25 DIAGNOSIS — K922 Gastrointestinal hemorrhage, unspecified: Secondary | ICD-10-CM | POA: Diagnosis not present

## 2015-06-25 DIAGNOSIS — M179 Osteoarthritis of knee, unspecified: Secondary | ICD-10-CM | POA: Diagnosis not present

## 2015-06-25 DIAGNOSIS — M1A9XX0 Chronic gout, unspecified, without tophus (tophi): Secondary | ICD-10-CM | POA: Diagnosis not present

## 2015-06-27 ENCOUNTER — Ambulatory Visit (HOSPITAL_BASED_OUTPATIENT_CLINIC_OR_DEPARTMENT_OTHER): Payer: Self-pay

## 2015-07-15 DIAGNOSIS — K625 Hemorrhage of anus and rectum: Secondary | ICD-10-CM | POA: Diagnosis not present

## 2015-07-15 DIAGNOSIS — D509 Iron deficiency anemia, unspecified: Secondary | ICD-10-CM | POA: Diagnosis not present

## 2015-07-16 DIAGNOSIS — J069 Acute upper respiratory infection, unspecified: Secondary | ICD-10-CM | POA: Diagnosis not present

## 2015-07-16 DIAGNOSIS — M179 Osteoarthritis of knee, unspecified: Secondary | ICD-10-CM | POA: Diagnosis not present

## 2015-07-16 DIAGNOSIS — K922 Gastrointestinal hemorrhage, unspecified: Secondary | ICD-10-CM | POA: Diagnosis not present

## 2015-08-15 DIAGNOSIS — M129 Arthropathy, unspecified: Secondary | ICD-10-CM | POA: Diagnosis not present

## 2015-08-15 DIAGNOSIS — J302 Other seasonal allergic rhinitis: Secondary | ICD-10-CM | POA: Diagnosis not present

## 2015-08-15 DIAGNOSIS — K922 Gastrointestinal hemorrhage, unspecified: Secondary | ICD-10-CM | POA: Diagnosis not present

## 2015-08-15 DIAGNOSIS — R002 Palpitations: Secondary | ICD-10-CM | POA: Diagnosis not present

## 2015-08-16 ENCOUNTER — Encounter (HOSPITAL_COMMUNITY): Payer: Self-pay

## 2015-08-16 ENCOUNTER — Inpatient Hospital Stay (HOSPITAL_COMMUNITY)
Admission: EM | Admit: 2015-08-16 | Discharge: 2015-08-17 | DRG: 812 | Disposition: A | Discharge status: Home / Self Care | Payer: 59 | Attending: Internal Medicine | Admitting: Internal Medicine

## 2015-08-16 DIAGNOSIS — M109 Gout, unspecified: Secondary | ICD-10-CM | POA: Diagnosis present

## 2015-08-16 DIAGNOSIS — K269 Duodenal ulcer, unspecified as acute or chronic, without hemorrhage or perforation: Secondary | ICD-10-CM | POA: Diagnosis present

## 2015-08-16 DIAGNOSIS — D649 Anemia, unspecified: Secondary | ICD-10-CM | POA: Diagnosis not present

## 2015-08-16 DIAGNOSIS — M1000 Idiopathic gout, unspecified site: Secondary | ICD-10-CM | POA: Diagnosis not present

## 2015-08-16 DIAGNOSIS — D509 Iron deficiency anemia, unspecified: Secondary | ICD-10-CM | POA: Diagnosis present

## 2015-08-16 HISTORY — DX: Anemia, unspecified: D64.9

## 2015-08-16 LAB — URINALYSIS, ROUTINE W REFLEX MICROSCOPIC
Bilirubin Urine: NEGATIVE
Glucose, UA: NEGATIVE mg/dL
Hgb urine dipstick: NEGATIVE
Ketones, ur: NEGATIVE mg/dL
Leukocytes, UA: NEGATIVE
Nitrite: NEGATIVE
Protein, ur: NEGATIVE mg/dL
Specific Gravity, Urine: 1.019 (ref 1.005–1.030)
pH: 7 (ref 5.0–8.0)

## 2015-08-16 LAB — BASIC METABOLIC PANEL
Anion gap: 6 (ref 5–15)
BUN: 15 mg/dL (ref 6–20)
CO2: 29 mmol/L (ref 22–32)
Calcium: 8.7 mg/dL — ABNORMAL LOW (ref 8.9–10.3)
Chloride: 105 mmol/L (ref 101–111)
Creatinine, Ser: 0.86 mg/dL (ref 0.44–1.00)
GFR calc Af Amer: >60 mL/min (ref >60)
GFR calc non Af Amer: >60 mL/min (ref >60)
Glucose, Bld: 117 mg/dL — ABNORMAL HIGH (ref 65–99)
Potassium: 4.3 mmol/L (ref 3.5–5.1)
Sodium: 140 mmol/L (ref 135–145)

## 2015-08-16 LAB — RETICULOCYTES
RBC.: 3.02 MIL/uL — ABNORMAL LOW (ref 3.87–5.11)
Retic Count, Absolute: 60.4 10*3/uL (ref 19.0–186.0)
Retic Ct Pct: 2 % (ref 0.4–3.1)

## 2015-08-16 LAB — CBC
HCT: 20.6 % — ABNORMAL LOW (ref 36.0–46.0)
Hemoglobin: 5.7 g/dL — CL (ref 12.0–15.0)
MCH: 19 pg — ABNORMAL LOW (ref 26.0–34.0)
MCHC: 27.7 g/dL — ABNORMAL LOW (ref 30.0–36.0)
MCV: 68.7 fL — ABNORMAL LOW (ref 78.0–100.0)
Platelets: 349 10*3/uL (ref 150–400)
RBC: 3 MIL/uL — ABNORMAL LOW (ref 3.87–5.11)
RDW: 21.6 % — ABNORMAL HIGH (ref 11.5–15.5)
WBC: 8.4 10*3/uL (ref 4.0–10.5)

## 2015-08-16 LAB — I-STAT TROPONIN, ED: Troponin i, poc: 0 ng/mL (ref 0.00–0.08)

## 2015-08-16 LAB — CBG MONITORING, ED: Glucose-Capillary: 108 mg/dL — ABNORMAL HIGH (ref 65–99)

## 2015-08-16 LAB — POC OCCULT BLOOD, ED: Fecal Occult Bld: NEGATIVE

## 2015-08-16 LAB — PREPARE RBC (CROSSMATCH)

## 2015-08-16 MED ORDER — PANTOPRAZOLE SODIUM 40 MG PO TBEC
40.0000 mg | DELAYED_RELEASE_TABLET | Freq: Every day | ORAL | Status: DC
  Administered 2015-08-17: 40 mg via ORAL
  Filled 2015-08-16: qty 1

## 2015-08-16 MED ORDER — TRAMADOL HCL 50 MG PO TABS
50.0000 mg | ORAL_TABLET | Freq: Four times a day (QID) | ORAL | Status: DC | PRN
  Administered 2015-08-17: 50 mg via ORAL
  Filled 2015-08-16: qty 1

## 2015-08-16 MED ORDER — SODIUM CHLORIDE 0.9 % IV SOLN
10.0000 mL/h | Freq: Once | INTRAVENOUS | Status: AC
  Administered 2015-08-16: 10 mL/h via INTRAVENOUS

## 2015-08-16 MED ORDER — COLCHICINE 0.6 MG PO TABS
0.6000 mg | ORAL_TABLET | Freq: Every day | ORAL | Status: DC | PRN
  Administered 2015-08-17: 0.6 mg via ORAL
  Filled 2015-08-16 (×2): qty 1

## 2015-08-16 MED ORDER — METHOCARBAMOL 500 MG PO TABS
750.0000 mg | ORAL_TABLET | Freq: Two times a day (BID) | ORAL | Status: DC | PRN
  Administered 2015-08-17: 750 mg via ORAL
  Filled 2015-08-16: qty 2

## 2015-08-16 MED ORDER — DIPHENHYDRAMINE HCL 25 MG PO CAPS
25.0000 mg | ORAL_CAPSULE | Freq: Four times a day (QID) | ORAL | Status: DC | PRN
  Filled 2015-08-16: qty 1

## 2015-08-16 MED ORDER — OLOPATADINE HCL 0.1 % OP SOLN
1.0000 [drp] | Freq: Two times a day (BID) | OPHTHALMIC | Status: DC
  Administered 2015-08-17: 1 [drp] via OPHTHALMIC
  Filled 2015-08-16: qty 5

## 2015-08-16 MED ORDER — ALLOPURINOL 300 MG PO TABS
300.0000 mg | ORAL_TABLET | Freq: Every day | ORAL | Status: DC
  Administered 2015-08-17: 300 mg via ORAL
  Filled 2015-08-16: qty 1

## 2015-08-16 MED FILL — FUSION PLUS CAPSULE: 30 days supply | Qty: 30 | Fill #0

## 2015-08-16 MED FILL — traMADol HCL 50 MG TABS: 50 | 30 days supply | Qty: 60 | Fill #0

## 2015-08-16 NOTE — ED Notes (Signed)
MD at bedside. 

## 2015-08-16 NOTE — ED Notes (Signed)
Pt reports her PCP informed her that her of a low Hgb results today. [facility] w/ c/o dizziness, sob, and weakness. Pt reports hx of low Hgb.

## 2015-08-16 NOTE — ED Provider Notes (Signed)
CSN: [id number]     Arrival date & time 08/16/15  1821 History   First MD Initiated Contact with Patient 08/16/15 2041     Chief Complaint  Patient presents with  . Dizziness  . Shortness of Breath  . Abnormal Lab     (Consider location/radiation/quality/duration/timing/severity/associated sxs/prior Treatment) HPI  55 year old female who presents with shortness of breath, fatigue and lightheadedness in setting of anemia. History of anemia, of unclear etiology. Was admitted in January for anemia requiring blood transfusion, and had negative GI workup. States that over the past few days she has had increasing lightheadedness, shortness of breath and fatigue with minimal activity. Discussed this with her primary care doctor and was scheduled for routine blood work yesterday. she was called to the ED today for anemia by her PCP. Denies syncope, chest pain, melena or hematochezia, nausea or vomiting, abdominal pain.  Past Medical History  Diagnosis Date  . Gout   . No pertinent past medical history   . Shortness of breath on exertion 06/03/11  . Low hemoglobin    Past Surgical History  Procedure Laterality Date  . Vaginal hysterectomy  ~ 2005  . Cervical conization w/bx  08/2004  . Abdominal hysterectomy    . Esophagogastroduodenoscopy N/A 06/13/2015    Procedure: ESOPHAGOGASTRODUODENOSCOPY (EGD)/Colonoscopy;  Surgeon: Jeani HawkingPatrick Hung, MD;  Location: [facility];  Service: Endoscopy;  Laterality: N/A;  . Colonoscopy N/A 06/13/2015    Procedure: COLONOSCOPY;  Surgeon: Jeani HawkingPatrick Hung, MD;  Location: [facility];  Service: Endoscopy;  Laterality: N/A;  . Givens capsule study N/A 06/14/2015    Procedure: GIVENS CAPSULE STUDY;  Surgeon: Jeani HawkingPatrick Hung, MD;  Location: [facility];  Service: Endoscopy;  Laterality: N/A;   History reviewed. No pertinent family history. Social History  Substance Use Topics  . Smoking status: Never Smoker   . Smokeless tobacco: None  . Alcohol Use: No     Comment:  Occassional   OB History    Gravida Para Term Preterm AB TAB SAB Ectopic Multiple Living   0 0 0 0 0 0 0 0       Review of Systems 10/14 systems reviewed and are negative other than those stated in the HPI    Allergies  Review of patient's allergies indicates no known allergies.  Home Medications   Prior to Admission medications   Medication Sig Start Date End Date Taking? Authorizing Provider  allopurinol (ZYLOPRIM) 300 MG tablet Take 300 mg by mouth daily.  05/16/15  Yes Historical Provider, MD  Ascorbic Acid (VITAMIN C) POWD Take 1 packet by mouth daily.   Yes Historical Provider, MD  colchicine 0.6 MG tablet Take 0.6 mg by mouth daily as needed (gout).    Yes Historical Provider, MD  Fe Fum-Fe Poly-Vit C-Lactobac (FUSION) 65-65-25-30 MG CAPS Take 1 capsule by mouth daily.   Yes Historical Provider, MD  methocarbamol (ROBAXIN) 750 MG tablet Take 750 mg by mouth 2 (two) times daily as needed for muscle spasms.  05/16/15  Yes Historical Provider, MD  pantoprazole (PROTONIX) 40 MG tablet Take 1 tablet (40 mg total) by mouth daily. 06/15/15  Yes Costin Otelia SergeantM Gherghe, MD  traMADol (ULTRAM) 50 MG tablet Take 1 tablet (50 mg total) by mouth every 6 (six) hours as needed. 06/15/15  Yes Costin Otelia SergeantM Gherghe, MD  albuterol (PROVENTIL HFA;VENTOLIN HFA) 108 (90 BASE) MCG/ACT inhaler Inhale 1-2 puffs into the lungs every 4 (four) hours as needed for wheezing or shortness of breath. 06/06/11 06/05/12  Alison MurrayAlma M Devine,  MD  calcium carbonate (TUMS - DOSED IN MG ELEMENTAL CALCIUM) 500 MG chewable tablet Chew 1 tablet by mouth as needed for indigestion or heartburn.    Historical Provider, MD  Chlorphen-PE-Acetaminophen (NOREL AD) 4-10-325 MG TABS Take 1 tablet by mouth 2 (two) times daily as needed (cold symptoms).     Historical Provider, MD  diphenhydrAMINE (BENADRYL) 25 MG tablet Take 25 mg by mouth every 6 (six) hours as needed. For allergies     Historical Provider, MD  fexofenadine-pseudoephedrine (ALLEGRA-D)  60-120 MG per tablet Take 1 tablet by mouth 2 (two) times daily as needed (for allergies).    Historical Provider, MD  Glucosamine-Chondroitin 750-600 MG TABS Take 1 tablet by mouth 2 (two) times daily.    Historical Provider, MD  Olopatadine HCl 0.2 % SOLN Place 1 drop into both eyes daily as needed (for allergies).    Historical Provider, MD  Vitamin D, Ergocalciferol, (DRISDOL) 50000 UNITS CAPS Take 50,000 Units by mouth every 7 (seven) days.     Historical Provider, MD   BP 119/65 mmHg  Pulse 72  Temp(Src) 98.2 F (36.8 C) (Oral)  Resp 16  Ht  (1.676 m)  Wt 279 lb 1.6 oz (126.6 kg)  BMI 45.07 kg/m2  SpO2 97%  LMP  (Exact Date) Physical Exam Physical Exam  Nursing note and vitals reviewed. Constitutional: Well developed, well nourished, non-toxic, and in no acute distress Head: Normocephalic and atraumatic.  Mouth/Throat: Oropharynx is clear and moist.  Neck: Normal range of motion. Neck supple.  Cardiovascular: Normal rate and regular rhythm.   Pulmonary/Chest: Effort normal and breath sounds normal.  Abdominal: Soft. There is no tenderness. There is no rebound and no guarding. guaiac negative stool. Musculoskeletal: Normal range of motion.  Neurological: Alert, no facial droop, fluent speech, moves all extremities symmetrically Skin: Skin is warm and dry.  Psychiatric: Cooperative  ED Course  Procedures (including critical care time) Labs Review Labs Reviewed  BASIC METABOLIC PANEL - Abnormal; Notable for the following:    Glucose, Bld 117 (*)    Calcium 8.7 (*)    All other components within normal limits  CBC - Abnormal; Notable for the following:    RBC 3.00 (*)    Hemoglobin 5.7 (*)    HCT 20.6 (*)    MCV 68.7 (*)    MCH 19.0 (*)    MCHC 27.7 (*)    RDW 21.6 (*)    All other components within normal limits  URINALYSIS, ROUTINE W REFLEX MICROSCOPIC (NOT AT [facility]) - Abnormal; Notable for the following:    APPearance CLOUDY (*)    All other components within  normal limits  IRON AND TIBC - Abnormal; Notable for the following:    Iron 17 (*)    TIBC 489 (*)    Saturation Ratios 3 (*)    All other components within normal limits  FERRITIN - Abnormal; Notable for the following:    Ferritin 6 (*)    All other components within normal limits  RETICULOCYTES - Abnormal; Notable for the following:    RBC. 3.02 (*)    All other components within normal limits  CBG MONITORING, ED - Abnormal; Notable for the following:    Glucose-Capillary 108 (*)    All other components within normal limits  VITAMIN B12  FOLATE  HEMOGLOBIN AND HEMATOCRIT, BLOOD  I-STAT TROPOININ, ED  POC OCCULT BLOOD, ED  TYPE AND SCREEN  PREPARE RBC (CROSSMATCH)    Imaging Review No results  found. I have personally reviewed and evaluated these images and lab results as part of my medical decision-making.   EKG Interpretation   Date/Time:  Friday August 16 2015 19:46:11 EST Ventricular Rate:  81 PR Interval:  172 QRS Duration: 100 QT Interval:  377 QTC Calculation: 438 R Axis:   50 Text Interpretation:  Sinus rhythm Borderline ST elevation, lateral leads  Baseline wander in lead(s) V2 V4 V5 no STEMI. no acute ischemic change  Confirmed by Donnald Garre, MD, Lebron Conners [phone]) on 08/16/2015 7:52:02 PM       CRITICAL CARE Performed by: Lavera Guise   Total critical care time: 30 minutes  Critical care time was exclusive of separately billable procedures and treating other patients.  Critical care was necessary to treat or prevent imminent or life-threatening deterioration.  Critical care was time spent personally by me on the following activities: development of treatment plan with patient and/or surrogate as well as nursing, discussions with consultants, evaluation of patient's response to treatment, examination of patient, obtaining history from patient or surrogate, ordering and performing treatments and interventions, ordering and review of laboratory studies, ordering  and review of radiographic studies, pulse oximetry and re-evaluation of patient's condition.   MDM   Final diagnoses:  Symptomatic anemia   55 year old female who presents with symptomatic anemia. Is hemodynamically stable and well-appearing on exam. She has soft and benign abdomen. Guaiac negative stools. No clear source of bleeding, but has anemia with a hemoglobin of 5.7. She will be transfused 2 units of blood, and will plan for admission for further management. Discussed with Dr. Julian Reil.    Lavera Guise, MD 08/17/15 [phone]

## 2015-08-16 NOTE — H&P (Signed)
[facility] History and Physical  Falicity Sheets UEA:[id number] DOB: [birth year] DOA: 08/16/2015  Referring physician: EDP PCP: [name], MD   Chief Complaint: Anemia   HPI: Emily Holland is a 55 y.o. female who was just admitted to our service in Jan for anemia, guiac was negative at that time but GIB work up was pursued anyhow due to history of melanotic stool per patient.  GIB work up including upper and lower endoscopy and capsule endoscopy was negative for source.  Over the past week or so patient has re-devloped symptoms of dizziness, SOB, weakness.  Went to PCPs office and HGB is once again low today.  Presents to ED.  In ED HGB IS 5.7, hemoccult is once again negative.  She denies any other sources of bleeding, no menstruation at all as she has had hysterectomy.  Review of Systems: Systems reviewed.  As above, otherwise negative  Past Medical History  Diagnosis Date  . Gout   . No pertinent past medical history   . Shortness of breath on exertion 06/03/11  . Low hemoglobin    Past Surgical History  Procedure Laterality Date  . Vaginal hysterectomy  ~ 2005  . Cervical conization w/bx  08/2004  . Abdominal hysterectomy    . Esophagogastroduodenoscopy N/A 06/13/2015    Procedure: ESOPHAGOGASTRODUODENOSCOPY (EGD)/Colonoscopy;  Surgeon: Jeani Hawking, MD;  Location: [facility];  Service: Endoscopy;  Laterality: N/A;  . Colonoscopy N/A 06/13/2015    Procedure: COLONOSCOPY;  Surgeon: Jeani Hawking, MD;  Location: [facility];  Service: Endoscopy;  Laterality: N/A;  . Givens capsule study N/A 06/14/2015    Procedure: GIVENS CAPSULE STUDY;  Surgeon: Jeani Hawking, MD;  Location: [facility];  Service: Endoscopy;  Laterality: N/A;   Social History:  reports that she has never smoked. She does not have any smokeless tobacco history on file. She reports that she does not drink alcohol or use illicit drugs.  No Known Allergies  History reviewed. No pertinent  family history.   Prior to Admission medications   Medication Sig Start Date End Date Taking? Authorizing Provider  allopurinol (ZYLOPRIM) 300 MG tablet Take 300 mg by mouth daily.  05/16/15  Yes Historical Provider, MD  Ascorbic Acid (VITAMIN C) POWD Take 1 packet by mouth daily.   Yes Historical Provider, MD  colchicine 0.6 MG tablet Take 0.6 mg by mouth daily as needed (gout).    Yes Historical Provider, MD  Fe Fum-Fe Poly-Vit C-Lactobac (FUSION) 65-65-25-30 MG CAPS Take 1 capsule by mouth daily.   Yes Historical Provider, MD  methocarbamol (ROBAXIN) 750 MG tablet Take 750 mg by mouth 2 (two) times daily as needed for muscle spasms.  05/16/15  Yes Historical Provider, MD  pantoprazole (PROTONIX) 40 MG tablet Take 1 tablet (40 mg total) by mouth daily. 06/15/15  Yes Costin Otelia Sergeant, MD  traMADol (ULTRAM) 50 MG tablet Take 1 tablet (50 mg total) by mouth every 6 (six) hours as needed. 06/15/15  Yes Costin Otelia Sergeant, MD  albuterol (PROVENTIL HFA;VENTOLIN HFA) 108 (90 BASE) MCG/ACT inhaler Inhale 1-2 puffs into the lungs every 4 (four) hours as needed for wheezing or shortness of breath. 06/06/11 06/05/12  Alison Murray, MD  calcium carbonate (TUMS - DOSED IN MG ELEMENTAL CALCIUM) 500 MG chewable tablet Chew 1 tablet by mouth as needed for indigestion or heartburn.    Historical Provider, MD  Chlorphen-PE-Acetaminophen (NOREL AD) 4-10-325 MG TABS Take 1 tablet by mouth 2 (two) times daily as needed (cold  symptoms).     Historical Provider, MD  diphenhydrAMINE (BENADRYL) 25 MG tablet Take 25 mg by mouth every 6 (six) hours as needed. For allergies     Historical Provider, MD  fexofenadine-pseudoephedrine (ALLEGRA-D) 60-120 MG per tablet Take 1 tablet by mouth 2 (two) times daily as needed (for allergies).    Historical Provider, MD  Glucosamine-Chondroitin 750-600 MG TABS Take 1 tablet by mouth 2 (two) times daily.    Historical Provider, MD  Olopatadine HCl 0.2 % SOLN Place 1 drop into both eyes daily as  needed (for allergies).    Historical Provider, MD  Vitamin D, Ergocalciferol, (DRISDOL) 50000 UNITS CAPS Take 50,000 Units by mouth every 7 (seven) days.     Historical Provider, MD   Physical Exam: Filed Vitals:   08/16/15 1932  BP: 152/59  Pulse: 83  Temp: 98.6 F (37 C)  Resp: 16    BP 152/59 mmHg  Pulse 83  Temp(Src) 98.6 F (37 C) (Oral)  Resp 16  Ht 5\' 5"  (1.651 m)  Wt 127.517 kg (281 lb 2 oz)  BMI 46.78 kg/m2  SpO2 100%  LMP  (Exact Date)  General Appearance:    Alert, oriented, no distress, appears stated age  Head:    Normocephalic, atraumatic  Eyes:    PERRL, EOMI, sclera non-icteric        Nose:   Nares without drainage or epistaxis. Mucosa, turbinates normal  Throat:   Moist mucous membranes. Oropharynx without erythema or exudate.  Neck:   Supple. No carotid bruits.  No thyromegaly.  No lymphadenopathy.   Back:     No CVA tenderness, no spinal tenderness  Lungs:     Clear to auscultation bilaterally, without wheezes, rhonchi or rales  Chest wall:    No tenderness to palpitation  Heart:    Regular rate and rhythm without murmurs, gallops, rubs  Abdomen:     Soft, non-tender, nondistended, normal bowel sounds, no organomegaly  Genitalia:    deferred  Rectal:    deferred  Extremities:   No clubbing, cyanosis or edema.  Pulses:   2+ and symmetric all extremities  Skin:   Skin color, texture, turgor normal, no rashes or lesions  Lymph nodes:   Cervical, supraclavicular, and axillary nodes normal  Neurologic:   CNII-XII intact. Normal strength, sensation and reflexes      throughout    Labs on Admission:  Basic Metabolic Panel:  Recent Labs Lab 08/16/15 2017  NA 140  K 4.3  CL 105  CO2 29  GLUCOSE 117*  BUN 15  CREATININE 0.86  CALCIUM 8.7*   Liver Function Tests: No results for input(s): AST, ALT, ALKPHOS, BILITOT, PROT, ALBUMIN in the last 168 hours. No results for input(s): LIPASE, AMYLASE in the last 168 hours. No results for input(s):  AMMONIA in the last 168 hours. CBC:  Recent Labs Lab 08/16/15 2017  WBC 8.4  HGB 5.7*  HCT 20.6*  MCV 68.7*  PLT 349   Cardiac Enzymes: No results for input(s): CKTOTAL, CKMB, CKMBINDEX, TROPONINI in the last 168 hours.  BNP (last 3 results) No results for input(s): PROBNP in the last 8760 hours. CBG:  Recent Labs Lab 08/16/15 2015  GLUCAP 108*    Radiological Exams on Admission: No results found.  EKG: Independently reviewed.  Assessment/Plan Active Problems:   Symptomatic anemia   1. Symptomatic anemia - 1. Anemia PNL ordered 2. Clear liquid diet although GI source is seeming less and less likely as we  go on with this 3. 2 units PRBC transfusion 4. Repeat H/H in AM 5. SCDs for DVT ppx, not clear that she IS even bleeding though    Code Status: Full  Family Communication: Family at bedside Disposition Plan: Admit to inpatient   Time spent: 50 min  Mayana Irigoyen M. [facility] Pager [phone]  If 7AM-7PM, please contact the day team taking care of the patient [email or web address] Password [email or web address] 08/16/2015, 10:01 PM

## 2015-08-17 ENCOUNTER — Encounter (HOSPITAL_COMMUNITY): Payer: Self-pay

## 2015-08-17 DIAGNOSIS — D509 Iron deficiency anemia, unspecified: Principal | ICD-10-CM

## 2015-08-17 DIAGNOSIS — D649 Anemia, unspecified: Secondary | ICD-10-CM

## 2015-08-17 DIAGNOSIS — M1000 Idiopathic gout, unspecified site: Secondary | ICD-10-CM

## 2015-08-17 DIAGNOSIS — M109 Gout, unspecified: Secondary | ICD-10-CM

## 2015-08-17 LAB — FERRITIN: Ferritin: 6 ng/mL — ABNORMAL LOW (ref 11–307)

## 2015-08-17 LAB — IRON AND TIBC
Iron: 17 ug/dL — ABNORMAL LOW (ref 28–170)
Saturation Ratios: 3 % — ABNORMAL LOW (ref 10.4–31.8)
TIBC: 489 ug/dL — ABNORMAL HIGH (ref 250–450)
UIBC: 472 ug/dL

## 2015-08-17 LAB — CBC
HCT: 25.4 % — ABNORMAL LOW (ref 36.0–46.0)
Hemoglobin: 7.4 g/dL — ABNORMAL LOW (ref 12.0–15.0)
MCH: 21.9 pg — ABNORMAL LOW (ref 26.0–34.0)
MCHC: 29.1 g/dL — ABNORMAL LOW (ref 30.0–36.0)
MCV: 75.1 fL — ABNORMAL LOW (ref 78.0–100.0)
Platelets: 320 10*3/uL (ref 150–400)
RBC: 3.38 MIL/uL — ABNORMAL LOW (ref 3.87–5.11)
RDW: 21.2 % — ABNORMAL HIGH (ref 11.5–15.5)
WBC: 8.6 10*3/uL (ref 4.0–10.5)

## 2015-08-17 LAB — FOLATE: Folate: 6 ng/mL (ref >5.9)

## 2015-08-17 LAB — VITAMIN B12: Vitamin B-12: 658 pg/mL (ref 180–914)

## 2015-08-17 MED ORDER — FERROUS SULFATE 325 (65 FE) MG PO TABS
325.0000 mg | ORAL_TABLET | Freq: Two times a day (BID) | ORAL | Status: DC
  Filled 2015-08-17: qty 1

## 2015-08-17 MED ORDER — SODIUM CHLORIDE 0.9 % IV SOLN
25.0000 mg | Freq: Once | INTRAVENOUS | Status: AC
  Administered 2015-08-17: 25 mg via INTRAVENOUS
  Filled 2015-08-17: qty 2

## 2015-08-17 MED ORDER — FERROUS SULFATE 325 (65 FE) MG PO TABS
325.0000 mg | ORAL_TABLET | Freq: Two times a day (BID) | ORAL | Status: DC
Start: 2015-08-17 — End: 2017-07-04

## 2015-08-17 MED ORDER — SODIUM CHLORIDE 0.9 % IV SOLN
250.0000 mg | Freq: Once | INTRAVENOUS | Status: AC
  Administered 2015-08-17: 250 mg via INTRAVENOUS
  Filled 2015-08-17: qty 20

## 2015-08-17 NOTE — Progress Notes (Signed)
Utilization review completed.  

## 2015-08-17 NOTE — Progress Notes (Signed)
Pt ambulated in hall with use of walker due to joint pain, tolerated well and says she feels fine to go home

## 2015-08-17 NOTE — Discharge Summary (Signed)
Physician Discharge Summary  Emily Holland WUJ:[id number] DOB: 05/20/1961 DOA: 08/16/2015  PCP: Dorrene GermanEdwin A Avbuere, MD  Admit date: 08/16/2015 Discharge date: 08/17/2015  Time spent: 50 minutes  Recommendations for Outpatient Follow-up:  1. CBC in 3-4 weeks  Discharge Condition: Stable    Discharge Diagnoses:  Principal Problem:   Anemia, iron deficiency Active Problems:   Gout   History of present illness:  Emily Holland is a 55 y.o. femaleWith gout who was just admitted to our service in Jan for anemia.She had a normal colonoscopy and EGD that showed a duodenal erosion. Capsule study was normal.  Over the past week or so patient has re-devloped symptoms of dizziness, SOB, weakness. Went to PCPs office and HGB is once again low today. Presents to ED.  In ED HGB IS 5.7, hemoccult is negative. She denies any other sources of bleeding- no menstruation at all as she has had hysterectomy.  Hospital Course:  Symptomatic Anemia due to iron deficiency -Workup reveales severe iron deficiency Iron/TIBC/Ferritin/ %Sat    Component Value Date/Time   IRON 17* 08/16/2015 2140   TIBC 489* 08/16/2015 2140   FERRITIN 6* 08/16/2015 2140   IRONPCTSAT 3* 08/16/2015 2140    -She has received 2 units of packed red blood cells and hemoglobin has improved to some 7.4-she has ambulated in the hall without any symptoms -I have also given her dose of IV Nuclecit- 500 mg and have started her on oral iron-have explained the side effects of iron and asked her to use a stool softener and/or laxatives if needed    Discharge Exam: Filed Weights   08/16/15 1932 08/16/15 2236  Weight: 127.517 kg (281 lb 2 oz) 126.6 kg (279 lb 1.6 oz)   Filed Vitals:   08/17/15 0900 08/17/15 1259  BP: 111/52 118/54  Pulse: 63 64  Temp: 98 F (36.7 C) 98.3 F (36.8 C)  Resp: 17 18    General: AAO x 3, no distress Cardiovascular: RRR, no murmurs  Respiratory: clear to auscultation bilaterally GI:  soft, non-tender, non-distended, bowel sound positive  Discharge Instructions You were cared for by a hospitalist during your hospital stay. If you have any questions about your discharge medications or the care you received while you were in the hospital after you are discharged, you can call the unit and asked to speak with the hospitalist on call if the hospitalist that took care of you is not available. Once you are discharged, your primary care physician will handle any further medical issues. Please note that NO REFILLS for any discharge medications will be authorized once you are discharged, as it is imperative that you return to your primary care physician (or establish a relationship with a primary care physician if you do not have one) for your aftercare needs so that they can reassess your need for medications and monitor your lab values.      Discharge Instructions    Diet - low sodium heart healthy    Complete by:  As directed      Increase activity slowly    Complete by:  As directed             Medication List    STOP taking these medications        NOREL AD 4-10-325 MG Tabs  Generic drug:  Chlorphen-PE-Acetaminophen      TAKE these medications        albuterol 108 (90 Base) MCG/ACT inhaler  Commonly known as:  PROVENTIL HFA;VENTOLIN  HFA  Inhale 1-2 puffs into the lungs every 4 (four) hours as needed for wheezing or shortness of breath.     allopurinol 300 MG tablet  Commonly known as:  ZYLOPRIM  Take 300 mg by mouth daily.     calcium carbonate 500 MG chewable tablet  Commonly known as:  TUMS - dosed in mg elemental calcium  Chew 1 tablet by mouth as needed for indigestion or heartburn.     colchicine 0.6 MG tablet  Take 0.6 mg by mouth daily as needed (gout).     diphenhydrAMINE 25 MG tablet  Commonly known as:  BENADRYL  Take 25 mg by mouth every 6 (six) hours as needed. For allergies     ferrous sulfate 325 (65 FE) MG tablet  Take 1 tablet (325 mg  total) by mouth 2 (two) times daily with a meal.     fexofenadine-pseudoephedrine 60-120 MG 12 hr tablet  Commonly known as:  ALLEGRA-D  Take 1 tablet by mouth 2 (two) times daily as needed (for allergies).     FUSION 65-65-25-30 MG Caps  Take 1 capsule by mouth daily.     Glucosamine-Chondroitin 750-600 MG Tabs  Take 1 tablet by mouth 2 (two) times daily.     methocarbamol 750 MG tablet  Commonly known as:  ROBAXIN  Take 750 mg by mouth 2 (two) times daily as needed for muscle spasms.     Olopatadine HCl 0.2 % Soln  Place 1 drop into both eyes daily as needed (for allergies).     pantoprazole 40 MG tablet  Commonly known as:  PROTONIX  Take 1 tablet (40 mg total) by mouth daily.     traMADol 50 MG tablet  Commonly known as:  ULTRAM  Take 1 tablet (50 mg total) by mouth every 6 (six) hours as needed.     Vitamin C Powd  Take 1 packet by mouth daily.     Vitamin D (Ergocalciferol) 50000 units Caps capsule  Commonly known as:  DRISDOL  Take 50,000 Units by mouth every 7 (seven) days.       No Known Allergies Follow-up Information    Follow up with [name], MD In 3 weeks.   Specialty:  Internal Medicine   Why:  CBC in 3wks   Contact information:   [address] [place] [address] [phone]        The results of significant diagnostics from this hospitalization (including imaging, microbiology, ancillary and laboratory) are listed below for reference.    Significant Diagnostic Studies: No results found.  Microbiology: No results found for this or any previous visit (from the past 240 hour(s)).   Labs: Basic Metabolic Panel:  Recent Labs Lab 08/16/15 2017  NA 140  K 4.3  CL 105  CO2 29  GLUCOSE 117*  BUN 15  CREATININE 0.86  CALCIUM 8.7*   Liver Function Tests: No results for input(s): AST, ALT, ALKPHOS, BILITOT, PROT, ALBUMIN in the last 168 hours. No results for input(s): LIPASE, AMYLASE in the last 168 hours. No results for  input(s): AMMONIA in the last 168 hours. CBC:  Recent Labs Lab 08/16/15 2017 08/17/15 1050  WBC 8.4 8.6  HGB 5.7* 7.4*  HCT 20.6* 25.4*  MCV 68.7* 75.1*  PLT 349 320   Cardiac Enzymes: No results for input(s): CKTOTAL, CKMB, CKMBINDEX, TROPONINI in the last 168 hours. BNP: BNP (last 3 results) No results for input(s): BNP in the last 8760 hours.  ProBNP (last 3  results) No results for input(s): PROBNP in the last 8760 hours.  CBG:  Recent Labs Lab 08/16/15 2015  GLUCAP 108*       SignedCalvert Cantor, MD [facility] 08/17/2015, 3:46 PM

## 2015-08-19 LAB — TYPE AND SCREEN
ABO/RH(D): B POS
Antibody Screen: NEGATIVE
Unit division: 0
Unit division: 0

## 2015-09-12 DIAGNOSIS — M179 Osteoarthritis of knee, unspecified: Secondary | ICD-10-CM | POA: Diagnosis not present

## 2015-09-12 DIAGNOSIS — M1A9XX0 Chronic gout, unspecified, without tophus (tophi): Secondary | ICD-10-CM | POA: Diagnosis not present

## 2015-09-12 DIAGNOSIS — D62 Acute posthemorrhagic anemia: Secondary | ICD-10-CM | POA: Diagnosis not present

## 2015-09-12 DIAGNOSIS — J302 Other seasonal allergic rhinitis: Secondary | ICD-10-CM | POA: Diagnosis not present

## 2015-09-18 MED FILL — VIT D2 1.25 MG (50,000 UNIT: 1.25 MG | 28 days supply | Qty: 4 | Fill #0

## 2015-11-07 DIAGNOSIS — M179 Osteoarthritis of knee, unspecified: Secondary | ICD-10-CM | POA: Diagnosis not present

## 2015-11-07 DIAGNOSIS — M1A9XX0 Chronic gout, unspecified, without tophus (tophi): Secondary | ICD-10-CM | POA: Diagnosis not present

## 2015-11-07 DIAGNOSIS — K922 Gastrointestinal hemorrhage, unspecified: Secondary | ICD-10-CM | POA: Diagnosis not present

## 2015-11-07 DIAGNOSIS — R7303 Prediabetes: Secondary | ICD-10-CM | POA: Diagnosis not present

## 2015-11-07 DIAGNOSIS — J302 Other seasonal allergic rhinitis: Secondary | ICD-10-CM | POA: Diagnosis not present

## 2015-11-26 MED FILL — COLCRYS 0.6 MG TABLET: 0.6 | 30 days supply | Qty: 60 | Fill #0

## 2015-11-26 MED FILL — PATADAY 0.2% EYE DROPS: 0.2 | 30 days supply | Qty: 3 | Fill #0

## 2015-11-26 MED FILL — METHOCARBAMOL 750 MG TABLET: 750 | 30 days supply | Qty: 60 | Fill #0

## 2015-12-17 IMAGING — CR DG CHEST 2V
2 series · 2 of 2 positions shown · non-contrast
Comparison: None.

CLINICAL DATA: Chest pain on the left for 3 days; cough and
difficulty breathing

EXAM:
CHEST  2 VIEW

[w chest pa]
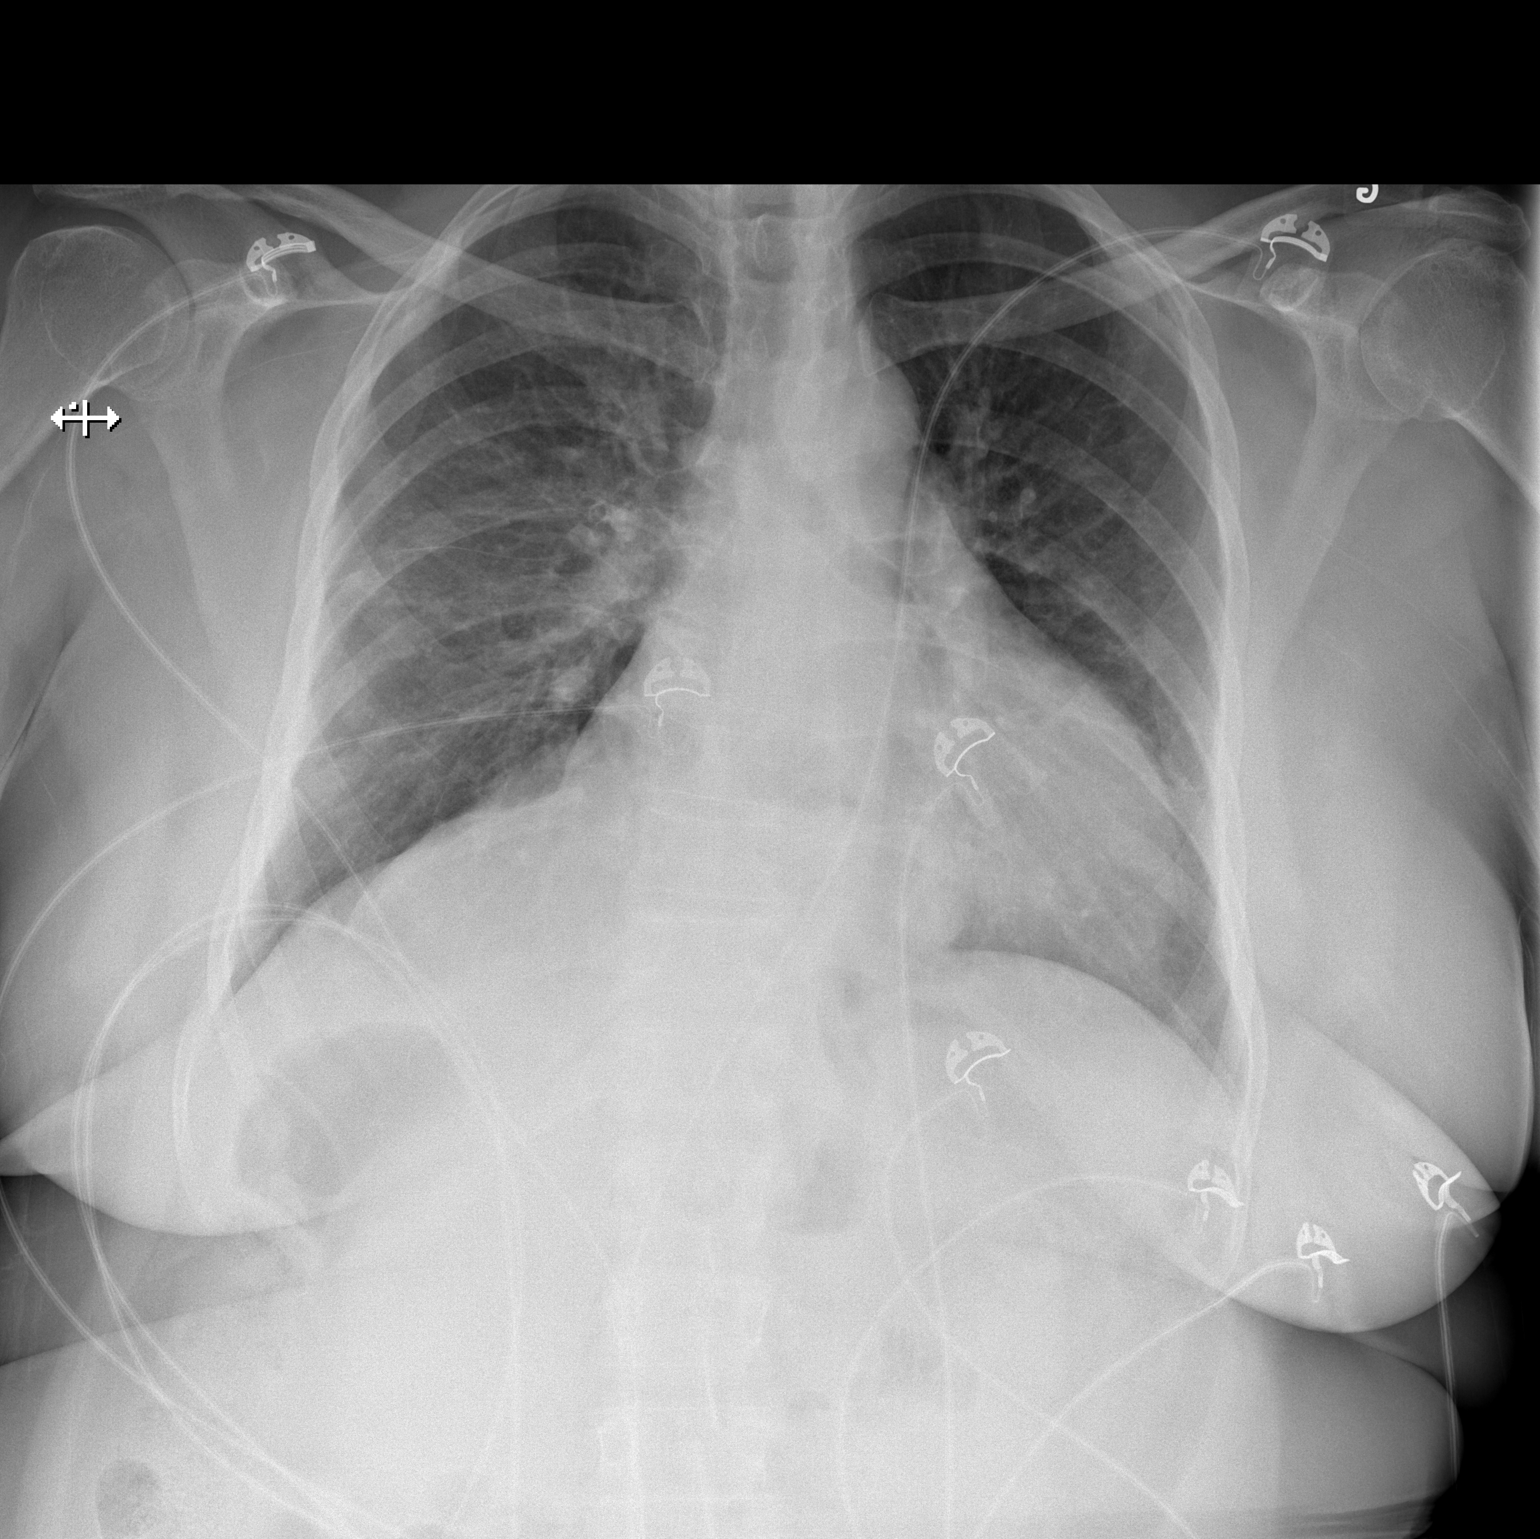

[w chest lat]
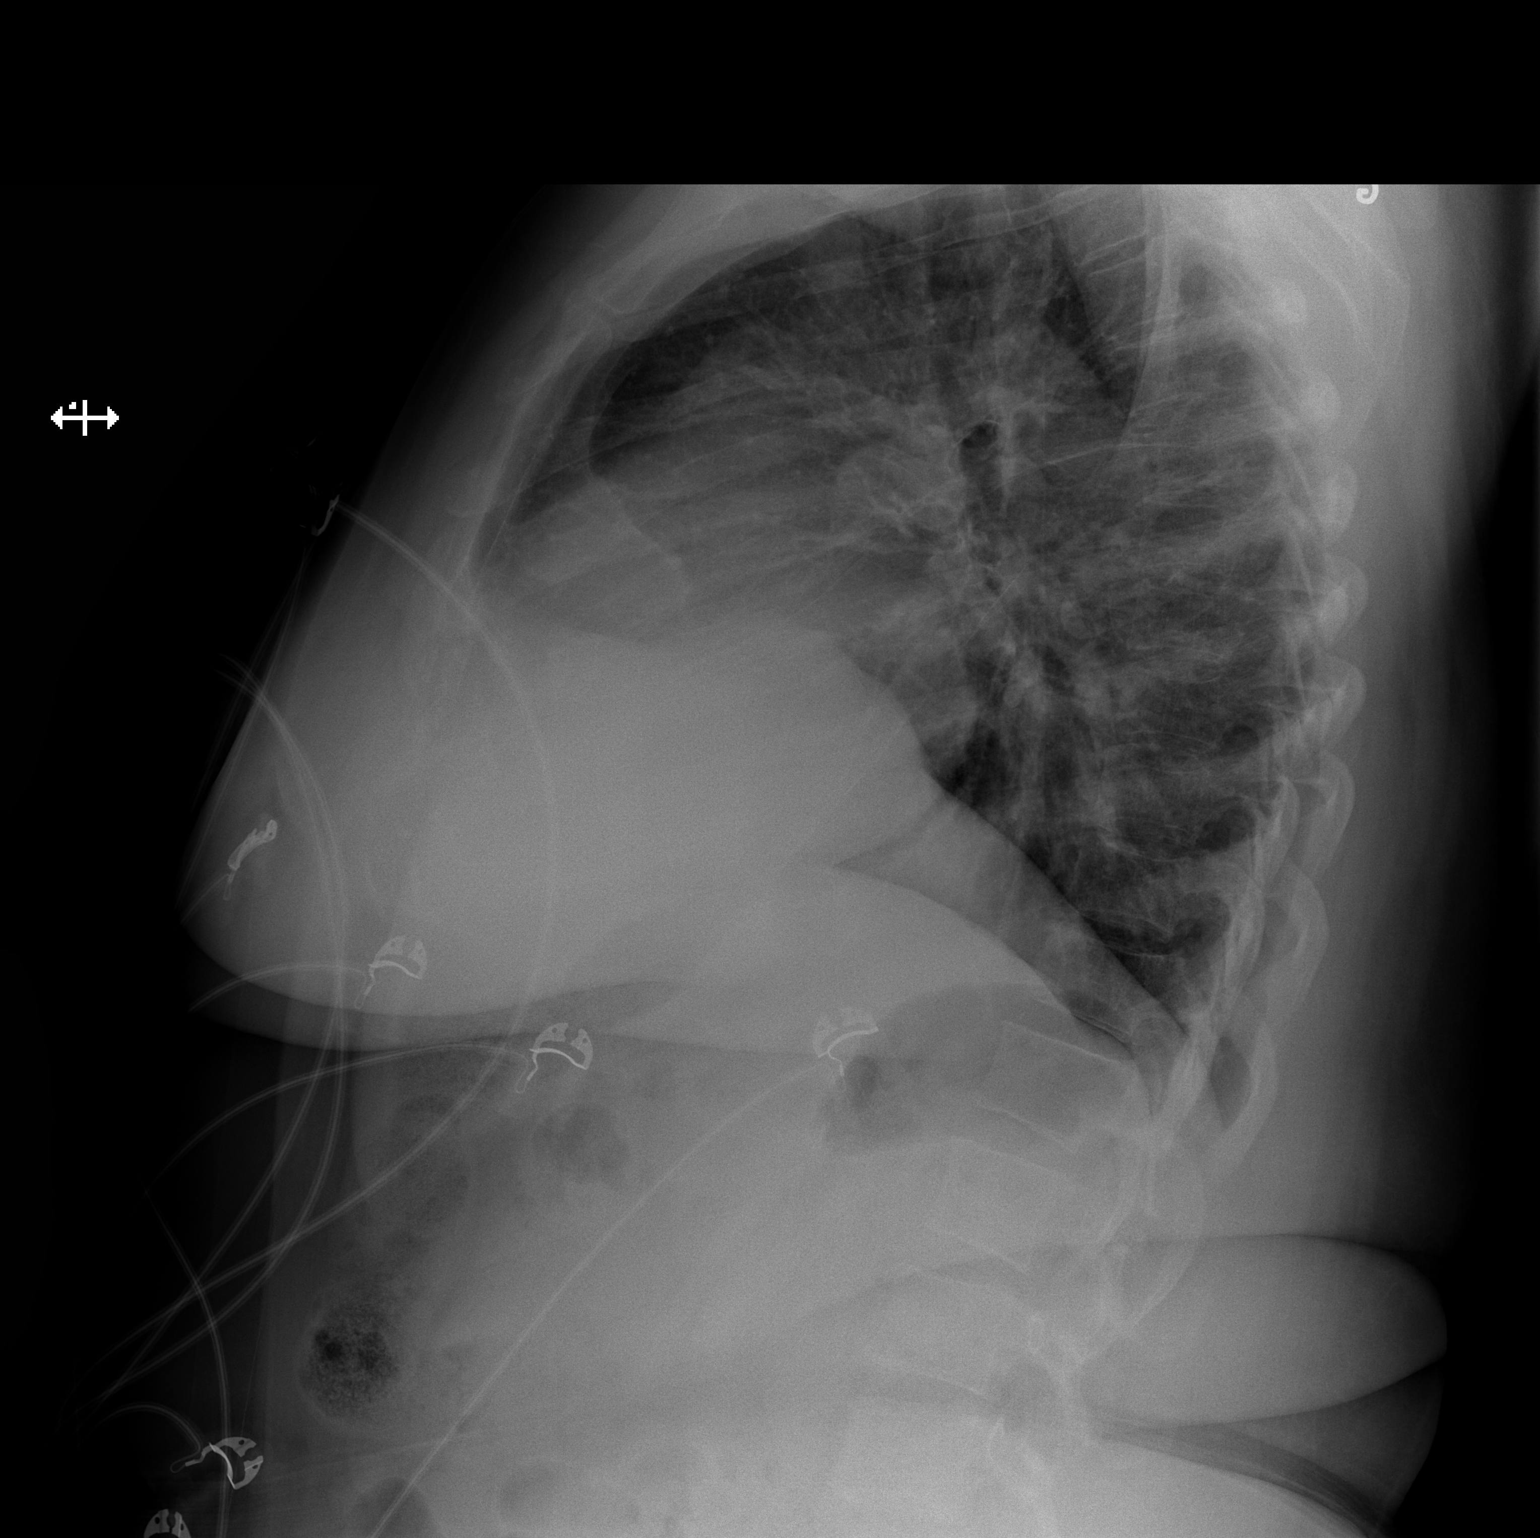

[2 of 2 positions shown; findings below may reference images not displayed]

FINDINGS: There is mild scarring in each mid lung region. There is no edema or
consolidation. Heart size is upper normal with pulmonary vascularity
within normal limits. No adenopathy. No bone lesions.
IMPRESSION: Areas of mild scarring bilaterally.  No edema or consolidation.

## 2015-12-19 DIAGNOSIS — J302 Other seasonal allergic rhinitis: Secondary | ICD-10-CM | POA: Diagnosis not present

## 2015-12-19 DIAGNOSIS — H6092 Unspecified otitis externa, left ear: Secondary | ICD-10-CM | POA: Diagnosis not present

## 2015-12-19 DIAGNOSIS — K922 Gastrointestinal hemorrhage, unspecified: Secondary | ICD-10-CM | POA: Diagnosis not present

## 2015-12-19 DIAGNOSIS — R7303 Prediabetes: Secondary | ICD-10-CM | POA: Diagnosis not present

## 2015-12-19 DIAGNOSIS — M1A9XX0 Chronic gout, unspecified, without tophus (tophi): Secondary | ICD-10-CM | POA: Diagnosis not present

## 2015-12-19 MED FILL — COLCHICINE 0.6 MG TABLET: 0.6 | 30 days supply | Qty: 60 | Fill #0

## 2015-12-19 MED FILL — ALLOPURINOL 300 MG TABLET: 300 | 30 days supply | Qty: 30 | Fill #0

## 2015-12-19 MED FILL — OFLOXACIN 0.3% EAR DROPS: 0.3 | 17 days supply | Qty: 5 | Fill #0

## 2016-03-02 DIAGNOSIS — J019 Acute sinusitis, unspecified: Secondary | ICD-10-CM | POA: Diagnosis not present

## 2016-03-02 DIAGNOSIS — M179 Osteoarthritis of knee, unspecified: Secondary | ICD-10-CM | POA: Diagnosis not present

## 2016-03-02 DIAGNOSIS — R7303 Prediabetes: Secondary | ICD-10-CM | POA: Diagnosis not present

## 2016-03-02 MED FILL — AZITHROMYCIN 250 MG TABLET: 250 | 5 days supply | Qty: 6 | Fill #0

## 2016-03-16 DIAGNOSIS — E2839 Other primary ovarian failure: Secondary | ICD-10-CM | POA: Diagnosis not present

## 2016-03-16 DIAGNOSIS — M1A9XX0 Chronic gout, unspecified, without tophus (tophi): Secondary | ICD-10-CM | POA: Diagnosis not present

## 2016-03-16 DIAGNOSIS — M179 Osteoarthritis of knee, unspecified: Secondary | ICD-10-CM | POA: Diagnosis not present

## 2016-03-16 DIAGNOSIS — R7303 Prediabetes: Secondary | ICD-10-CM | POA: Diagnosis not present

## 2016-03-16 DIAGNOSIS — Z23 Encounter for immunization: Secondary | ICD-10-CM | POA: Diagnosis not present

## 2016-03-16 DIAGNOSIS — K2901 Acute gastritis with bleeding: Secondary | ICD-10-CM | POA: Diagnosis not present

## 2016-03-16 MED FILL — PANTOPRAZOLE SOD DR 40 MG T: 40 | 90 days supply | Qty: 90 | Fill #0

## 2016-03-17 ENCOUNTER — Encounter (HOSPITAL_COMMUNITY): Payer: Self-pay | Admitting: Emergency Medicine

## 2016-03-17 ENCOUNTER — Observation Stay (HOSPITAL_COMMUNITY)
Admission: EM | Admit: 2016-03-17 | Discharge: 2016-03-20 | Disposition: A | Discharge status: Home / Self Care | Payer: 59 | Attending: Family Medicine | Admitting: Family Medicine

## 2016-03-17 DIAGNOSIS — K219 Gastro-esophageal reflux disease without esophagitis: Secondary | ICD-10-CM | POA: Diagnosis not present

## 2016-03-17 DIAGNOSIS — Z90710 Acquired absence of both cervix and uterus: Secondary | ICD-10-CM | POA: Insufficient documentation

## 2016-03-17 DIAGNOSIS — M1389 Other specified arthritis, multiple sites: Secondary | ICD-10-CM | POA: Insufficient documentation

## 2016-03-17 DIAGNOSIS — K2970 Gastritis, unspecified, without bleeding: Secondary | ICD-10-CM | POA: Insufficient documentation

## 2016-03-17 DIAGNOSIS — D649 Anemia, unspecified: Secondary | ICD-10-CM | POA: Insufficient documentation

## 2016-03-17 DIAGNOSIS — M6281 Muscle weakness (generalized): Secondary | ICD-10-CM

## 2016-03-17 DIAGNOSIS — M109 Gout, unspecified: Secondary | ICD-10-CM | POA: Diagnosis present

## 2016-03-17 DIAGNOSIS — D509 Iron deficiency anemia, unspecified: Secondary | ICD-10-CM | POA: Diagnosis not present

## 2016-03-17 DIAGNOSIS — K269 Duodenal ulcer, unspecified as acute or chronic, without hemorrhage or perforation: Secondary | ICD-10-CM | POA: Diagnosis not present

## 2016-03-17 DIAGNOSIS — D62 Acute posthemorrhagic anemia: Secondary | ICD-10-CM | POA: Insufficient documentation

## 2016-03-17 DIAGNOSIS — M1000 Idiopathic gout, unspecified site: Secondary | ICD-10-CM

## 2016-03-17 DIAGNOSIS — E2839 Other primary ovarian failure: Secondary | ICD-10-CM

## 2016-03-17 HISTORY — DX: Anemia, unspecified: D64.9

## 2016-03-17 HISTORY — DX: Personal history of other medical treatment: Z92.89

## 2016-03-17 HISTORY — DX: Unspecified osteoarthritis, unspecified site: M19.90

## 2016-03-17 HISTORY — DX: Gastro-esophageal reflux disease without esophagitis: K21.9

## 2016-03-17 HISTORY — DX: Personal history of diseases of the blood and blood-forming organs and certain disorders involving the immune mechanism: Z86.2

## 2016-03-17 LAB — CBC
HCT: 21.6 % — ABNORMAL LOW (ref 36.0–46.0)
Hemoglobin: 5.6 g/dL — CL (ref 12.0–15.0)
MCH: 17.9 pg — ABNORMAL LOW (ref 26.0–34.0)
MCHC: 25.9 g/dL — ABNORMAL LOW (ref 30.0–36.0)
MCV: 69 fL — ABNORMAL LOW (ref 78.0–100.0)
Platelets: 476 10*3/uL — ABNORMAL HIGH (ref 150–400)
RBC: 3.13 MIL/uL — ABNORMAL LOW (ref 3.87–5.11)
RDW: 23.7 % — ABNORMAL HIGH (ref 11.5–15.5)
WBC: 7.7 10*3/uL (ref 4.0–10.5)

## 2016-03-17 LAB — COMPREHENSIVE METABOLIC PANEL
ALT: 12 U/L — ABNORMAL LOW (ref 14–54)
AST: 22 U/L (ref 15–41)
Albumin: 3.4 g/dL — ABNORMAL LOW (ref 3.5–5.0)
Alkaline Phosphatase: 69 U/L (ref 38–126)
Anion gap: 8 (ref 5–15)
BUN: 12 mg/dL (ref 6–20)
CO2: 29 mmol/L (ref 22–32)
Calcium: 9.1 mg/dL (ref 8.9–10.3)
Chloride: 102 mmol/L (ref 101–111)
Creatinine, Ser: 0.78 mg/dL (ref 0.44–1.00)
GFR calc Af Amer: >60 mL/min (ref >60)
GFR calc non Af Amer: >60 mL/min (ref >60)
Glucose, Bld: 140 mg/dL — ABNORMAL HIGH (ref 65–99)
Potassium: 4.1 mmol/L (ref 3.5–5.1)
Sodium: 139 mmol/L (ref 135–145)
Total Bilirubin: 0.4 mg/dL (ref 0.3–1.2)
Total Protein: 7.8 g/dL (ref 6.5–8.1)

## 2016-03-17 LAB — ABO/RH: ABO/RH(D): B POS

## 2016-03-17 LAB — PREPARE RBC (CROSSMATCH)

## 2016-03-17 LAB — RETICULOCYTES
RBC.: 3.17 MIL/uL — ABNORMAL LOW (ref 3.87–5.11)
Retic Count, Absolute: 38 10*3/uL (ref 19.0–186.0)
Retic Ct Pct: 1.2 % (ref 0.4–3.1)

## 2016-03-17 LAB — POC OCCULT BLOOD, ED: Fecal Occult Bld: NEGATIVE

## 2016-03-17 MED ORDER — SODIUM CHLORIDE 0.9 % IV SOLN
INTRAVENOUS | Status: DC
  Administered 2016-03-18: 02:00:00 via INTRAVENOUS

## 2016-03-17 MED ORDER — ONDANSETRON HCL 4 MG/2ML IJ SOLN: 4.0000 mg | Freq: Four times a day (QID) | INTRAMUSCULAR | Status: DC | PRN

## 2016-03-17 MED ORDER — COLCHICINE 0.6 MG PO TABS: 0.6000 mg | ORAL_TABLET | Freq: Every day | ORAL | Status: DC | PRN

## 2016-03-17 MED ORDER — SODIUM CHLORIDE 0.9 % IV SOLN
10.0000 mL/h | Freq: Once | INTRAVENOUS | Status: AC
  Administered 2016-03-17: 10 mL/h via INTRAVENOUS

## 2016-03-17 MED ORDER — ALBUTEROL SULFATE (2.5 MG/3ML) 0.083% IN NEBU: 2.5000 mg | INHALATION_SOLUTION | RESPIRATORY_TRACT | Status: DC | PRN

## 2016-03-17 MED ORDER — ONDANSETRON HCL 4 MG PO TABS: 4.0000 mg | ORAL_TABLET | Freq: Four times a day (QID) | ORAL | Status: DC | PRN

## 2016-03-17 MED ORDER — DIPHENHYDRAMINE HCL 25 MG PO CAPS: 25.0000 mg | ORAL_CAPSULE | Freq: Four times a day (QID) | ORAL | Status: DC | PRN

## 2016-03-17 MED ORDER — ACETAMINOPHEN 650 MG RE SUPP: 650.0000 mg | Freq: Four times a day (QID) | RECTAL | Status: DC | PRN

## 2016-03-17 MED ORDER — ACETAMINOPHEN 325 MG PO TABS
650.0000 mg | ORAL_TABLET | Freq: Four times a day (QID) | ORAL | Status: DC | PRN
  Administered 2016-03-18: 650 mg via ORAL
  Filled 2016-03-17: qty 2

## 2016-03-17 MED ORDER — CALCIUM CARBONATE ANTACID 500 MG PO CHEW
1.0000 | CHEWABLE_TABLET | ORAL | Status: DC | PRN
  Filled 2016-03-17: qty 1

## 2016-03-17 NOTE — ED Notes (Signed)
PA at bedside.

## 2016-03-17 NOTE — ED Notes (Signed)
Attempted to call report at this time 

## 2016-03-17 NOTE — H&P (Signed)
History and Physical    Emily Holland:[id number] DOB: 07/25/1960 DOA: 03/17/2016  Referring MD/NP/PA: Gaylyn Rong, PA-C PCP: [name], MD  Patient coming from: Home  Chief Complaint: Doctor told me my blood was low  HPI: Emily Holland is a 55 y.o. female with medical history significant of anemia and gout; who presents after she reports being called by her time at your office with reports of low blood counts. Over the last 3 weeks or so the patient reports feeling generalized weakness, tired, and dizzy. Patient admits to taking ibuprofen but states that she rarely takes this medicine. Associated with reports of intermittent black stools, headaches, and shortness of breath. Denies any pain, nausea, vomiting, or abdominal pain. Patient reports that she followed up with her PCP for a routine checkup yesterday and blood work was ordered. Upon review of the chart patient has been admitted back in 08/2015 and 06/2015 with similar complaints which she was evaluated with EGD, colonoscopy, and capsule endoscopy. She was found to have a small duodenal erosion, but no other acute source of to account for the bleeding.   ED Course: Upon admission into the emergency department patient was seen to be afebrile with vitals all within normal limits. Lab work revealed hemoglobin of 5.6, and all other values relatively within normal limits. Stool guaiac negative. Patient was typed and crossed for blood products and ordered to be given 2 units of packed red blood cells while in the ED. [facility] called to admit to a MedSurg bed.  Review of Systems: As per HPI otherwise 10 point review of systems negative.   Past Medical History:  Diagnosis Date  . Gout   . Low hemoglobin   . No pertinent past medical history   . Shortness of breath on exertion 06/03/11    Past Surgical History:  Procedure Laterality Date  . ABDOMINAL HYSTERECTOMY    . CERVICAL CONIZATION W/BX  08/2004  . COLONOSCOPY N/A  06/13/2015   Procedure: COLONOSCOPY;  Surgeon: Jeani Hawking, MD;  Location: [facility];  Service: Endoscopy;  Laterality: N/A;  . ESOPHAGOGASTRODUODENOSCOPY N/A 06/13/2015   Procedure: ESOPHAGOGASTRODUODENOSCOPY (EGD)/Colonoscopy;  Surgeon: Jeani Hawking, MD;  Location: [facility];  Service: Endoscopy;  Laterality: N/A;  . GIVENS CAPSULE STUDY N/A 06/14/2015   Procedure: GIVENS CAPSULE STUDY;  Surgeon: Jeani Hawking, MD;  Location: [facility];  Service: Endoscopy;  Laterality: N/A;  . VAGINAL HYSTERECTOMY  ~ 2005     reports that she has never smoked. She has never used smokeless tobacco. She reports that she does not drink alcohol or use drugs.  No Known Allergies  History reviewed. No pertinent family history.  Prior to Admission medications   Medication Sig Start Date End Date Taking? Authorizing Provider  albuterol (PROVENTIL HFA;VENTOLIN HFA) 108 (90 BASE) MCG/ACT inhaler Inhale 1-2 puffs into the lungs every 4 (four) hours as needed for wheezing or shortness of breath. 06/06/11 06/05/12  Alison Murray, MD  allopurinol (ZYLOPRIM) 300 MG tablet Take 300 mg by mouth daily.  05/16/15   Historical Provider, MD  Ascorbic Acid (VITAMIN C) POWD Take 1 packet by mouth daily.    Historical Provider, MD  calcium carbonate (TUMS - DOSED IN MG ELEMENTAL CALCIUM) 500 MG chewable tablet Chew 1 tablet by mouth as needed for indigestion or heartburn.    Historical Provider, MD  colchicine 0.6 MG tablet Take 0.6 mg by mouth daily as needed (gout).     Historical Provider, MD  diphenhydrAMINE (BENADRYL) 25  MG tablet Take 25 mg by mouth every 6 (six) hours as needed. For allergies     Historical Provider, MD  Fe Fum-Fe Poly-Vit C-Lactobac (FUSION) 65-65-25-30 MG CAPS Take 1 capsule by mouth daily.    Historical Provider, MD  ferrous sulfate 325 (65 FE) MG tablet Take 1 tablet (325 mg total) by mouth 2 (two) times daily with a meal. 08/17/15   Calvert Cantor, MD  fexofenadine-pseudoephedrine (ALLEGRA-D) 60-120  MG per tablet Take 1 tablet by mouth 2 (two) times daily as needed (for allergies).    Historical Provider, MD  Glucosamine-Chondroitin 750-600 MG TABS Take 1 tablet by mouth 2 (two) times daily.    Historical Provider, MD  methocarbamol (ROBAXIN) 750 MG tablet Take 750 mg by mouth 2 (two) times daily as needed for muscle spasms.  05/16/15   Historical Provider, MD  Olopatadine HCl 0.2 % SOLN Place 1 drop into both eyes daily as needed (for allergies).    Historical Provider, MD  pantoprazole (PROTONIX) 40 MG tablet Take 1 tablet (40 mg total) by mouth daily. 06/15/15   Costin Otelia Sergeant, MD  traMADol (ULTRAM) 50 MG tablet Take 1 tablet (50 mg total) by mouth every 6 (six) hours as needed. 06/15/15   Costin Otelia Sergeant, MD  Vitamin D, Ergocalciferol, (DRISDOL) 50000 UNITS CAPS Take 50,000 Units by mouth every 7 (seven) days.     Historical Provider, MD    Physical Exam:    Constitutional: Older female in NAD, calm, comfortable Vitals:   03/17/16 1912 03/17/16 1915 03/17/16 1929 03/17/16 1930  BP: (!) 106/45 (!) 107/49  115/57  Pulse: 70 67  65  Resp: 12   13  Temp: 98.5 F (36.9 C)  (S) 98.6 F (37 C)   TempSrc: Oral  Oral   SpO2: 98% 99%  97%  Weight:      Height:       Eyes: PERRL, lids and conjunctivae normal ENMT: Mucous membranes are moist. Posterior pharynx clear of any exudate or lesions.Normal dentition.  Neck: normal, supple, no masses, no thyromegaly Respiratory: clear to auscultation bilaterally, no wheezing, no crackles. Normal respiratory effort. No accessory muscle use.  Cardiovascular: Regular rate and rhythm, no murmurs / rubs / gallops. No extremity edema. 2+ pedal pulses. No carotid bruits.  Abdomen: no tenderness, no masses palpated. No hepatosplenomegaly. Bowel sounds positive.  Musculoskeletal: no clubbing / cyanosis. No joint deformity upper and lower extremities. Good ROM, no contractures. Normal muscle tone.  Skin: Slightly diaphoretic, no rashes, lesions, ulcers. No  induration Neurologic: CN 2-12 grossly intact. Sensation intact, DTR normal. Strength 5/5 in all 4.  Psychiatric: Normal judgment and insight. Alert and oriented x 3. Normal mood.     Labs on Admission: I have personally reviewed following labs and imaging studies  CBC:  Recent Labs Lab 03/17/16 1512  WBC 7.7  HGB 5.6*  HCT 21.6*  MCV 69.0*  PLT 476*   Basic Metabolic Panel:  Recent Labs Lab 03/17/16 1512  NA 139  K 4.1  CL 102  CO2 29  GLUCOSE 140*  BUN 12  CREATININE 0.78  CALCIUM 9.1   GFR: Estimated Creatinine Clearance: 108.7 mL/min (by C-G formula based on SCr of 0.78 mg/dL). Liver Function Tests:  Recent Labs Lab 03/17/16 1512  AST 22  ALT 12*  ALKPHOS 69  BILITOT 0.4  PROT 7.8  ALBUMIN 3.4*   No results for input(s): LIPASE, AMYLASE in the last 168 hours. No results for input(s): AMMONIA in the  last 168 hours. Coagulation Profile: No results for input(s): INR, PROTIME in the last 168 hours. Cardiac Enzymes: No results for input(s): CKTOTAL, CKMB, CKMBINDEX, TROPONINI in the last 168 hours. BNP (last 3 results) No results for input(s): PROBNP in the last 8760 hours. HbA1C: No results for input(s): HGBA1C in the last 72 hours. CBG: No results for input(s): GLUCAP in the last 168 hours. Lipid Profile: No results for input(s): CHOL, HDL, LDLCALC, TRIG, CHOLHDL, LDLDIRECT in the last 72 hours. Thyroid Function Tests: No results for input(s): TSH, T4TOTAL, FREET4, T3FREE, THYROIDAB in the last 72 hours. Anemia Panel: No results for input(s): VITAMINB12, FOLATE, FERRITIN, TIBC, IRON, RETICCTPCT in the last 72 hours. Urine analysis:    Component Value Date/Time   COLORURINE YELLOW 08/16/2015 2141   APPEARANCEUR CLOUDY (A) 08/16/2015 2141   LABSPEC 1.019 08/16/2015 2141   PHURINE 7.0 08/16/2015 2141   GLUCOSEU NEGATIVE 08/16/2015 2141   HGBUR NEGATIVE 08/16/2015 2141   BILIRUBINUR NEGATIVE 08/16/2015 2141   KETONESUR NEGATIVE 08/16/2015 2141     PROTEINUR NEGATIVE 08/16/2015 2141   NITRITE NEGATIVE 08/16/2015 2141   LEUKOCYTESUR NEGATIVE 08/16/2015 2141   Sepsis Labs: No results found for this or any previous visit (from the past 240 hour(s)).   Radiological Exams on Admission: No results found.  EKG: Independently reviewed. Sinus rhythm  Assessment/Plan Symptomatic Anemia: Acute. Hemoglobin noted to be as low as 5.6 on admission. Patient was initially found guaiac-negative. - Admit to MedSurg - Clear liquid diet for now - Check anemia panel off previous lab work if able - Continued transfusion of 2 units PRBCs - Check H&H 1 hour following transfusion - Goal hemoglobin greater than equal to 7 - Will consult gastroenterology - If gastroenterology workup again negative, may need follow-up with hematology - Patient with previous history of iron deficiency  Gout History - Continue allopurinol and colchicine  GERD - Continue Protonix  DVT prophylaxis: SCD Code Status: Full Family Communication: No family present at bedside Disposition Plan: Discharge home once medically stable Consults called: None Admission status: Observation  Clydie Braunondell A Smith MD [facility] Pager [phone]  If 7PM-7AM, please contact night-coverage [email or web address] Password [email or web address]  03/17/2016, 8:17 PM

## 2016-03-17 NOTE — ED Provider Notes (Signed)
[facility]-EMERGENCY DEPT Provider Note   CSN: [id number] Arrival date & time: 03/17/16  1503     History   Chief Complaint Chief Complaint  Patient presents with  . Abnormal Lab    HPI Emily Holland is a 55 y.o. female with a past medical history of anemia who presents to the ED today sent by her PCP for low hemoglobin. Patient states that for the past month Emily Holland has been feeling increased fatigue and intermittent dizziness. Emily Holland saw her primary care doctor yesterday who did lab work. Emily Holland was called today and told that her hemoglobin was very low and encouraged to come to the ED for further evaluation. Patient has also had intermittent shortness of breath and chest pain over the last month. Emily Holland is currently denying any shortness of breath or chest pain in the ED. Emily Holland denies any melena or hematochezia. No fevers, chills, vaginal bleeding, abdominal pain.  Of note, patient was admitted to the hospital in January 2017 in March 2017 for similar symptoms. Emily Holland had a normal EGD, colonoscopy and capsule endoscopy done by Dr. Elnoria HowardHung. Etiology of anemia remains unclear. Patient is no longer taking iron supplements as Emily Holland states Emily Holland was instructed to DC them. Patient has never seen a hematologist.  HPI  Past Medical History:  Diagnosis Date  . Gout   . Low hemoglobin   . No pertinent past medical history   . Shortness of breath on exertion 06/03/11    Patient Active Problem List   Diagnosis Date Noted  . Gout 08/17/2015  . Anemia, iron deficiency 08/17/2015  . GI bleeding 06/15/2015  . Acute blood loss anemia 06/11/2015  . Hypoxemia 06/03/2011  . Fever 06/03/2011  . Obesity 06/03/2011    Past Surgical History:  Procedure Laterality Date  . ABDOMINAL HYSTERECTOMY    . CERVICAL CONIZATION W/BX  08/2004  . COLONOSCOPY N/A 06/13/2015   Procedure: COLONOSCOPY;  Surgeon: Jeani HawkingPatrick Hung, MD;  Location: [facility];  Service: Endoscopy;  Laterality: N/A;  . ESOPHAGOGASTRODUODENOSCOPY N/A  06/13/2015   Procedure: ESOPHAGOGASTRODUODENOSCOPY (EGD)/Colonoscopy;  Surgeon: Jeani HawkingPatrick Hung, MD;  Location: [facility];  Service: Endoscopy;  Laterality: N/A;  . GIVENS CAPSULE STUDY N/A 06/14/2015   Procedure: GIVENS CAPSULE STUDY;  Surgeon: Jeani HawkingPatrick Hung, MD;  Location: [facility];  Service: Endoscopy;  Laterality: N/A;  . VAGINAL HYSTERECTOMY  ~ 2005    OB History    Gravida Para Term Preterm AB Living   0 0 0 0 0     SAB TAB Ectopic Multiple Live Births   0 0 0           Home Medications    Prior to Admission medications   Medication Sig Start Date End Date Taking? Authorizing Provider  albuterol (PROVENTIL HFA;VENTOLIN HFA) 108 (90 BASE) MCG/ACT inhaler Inhale 1-2 puffs into the lungs every 4 (four) hours as needed for wheezing or shortness of breath. 06/06/11 06/05/12  Alison MurrayAlma M Devine, MD  allopurinol (ZYLOPRIM) 300 MG tablet Take 300 mg by mouth daily.  05/16/15   Historical Provider, MD  Ascorbic Acid (VITAMIN C) POWD Take 1 packet by mouth daily.    Historical Provider, MD  calcium carbonate (TUMS - DOSED IN MG ELEMENTAL CALCIUM) 500 MG chewable tablet Chew 1 tablet by mouth as needed for indigestion or heartburn.    Historical Provider, MD  colchicine 0.6 MG tablet Take 0.6 mg by mouth daily as needed (gout).     Historical Provider, MD  diphenhydrAMINE (BENADRYL) 25 MG tablet Take  25 mg by mouth every 6 (six) hours as needed. For allergies     Historical Provider, MD  Fe Fum-Fe Poly-Vit C-Lactobac (FUSION) 65-65-25-30 MG CAPS Take 1 capsule by mouth daily.    Historical Provider, MD  ferrous sulfate 325 (65 FE) MG tablet Take 1 tablet (325 mg total) by mouth 2 (two) times daily with a meal. 08/17/15   Calvert Cantor, MD  fexofenadine-pseudoephedrine (ALLEGRA-D) 60-120 MG per tablet Take 1 tablet by mouth 2 (two) times daily as needed (for allergies).    Historical Provider, MD  Glucosamine-Chondroitin 750-600 MG TABS Take 1 tablet by mouth 2 (two) times daily.    Historical  Provider, MD  methocarbamol (ROBAXIN) 750 MG tablet Take 750 mg by mouth 2 (two) times daily as needed for muscle spasms.  05/16/15   Historical Provider, MD  Olopatadine HCl 0.2 % SOLN Place 1 drop into both eyes daily as needed (for allergies).    Historical Provider, MD  pantoprazole (PROTONIX) 40 MG tablet Take 1 tablet (40 mg total) by mouth daily. 06/15/15   Costin Otelia Sergeant, MD  traMADol (ULTRAM) 50 MG tablet Take 1 tablet (50 mg total) by mouth every 6 (six) hours as needed. 06/15/15   Costin Otelia Sergeant, MD  Vitamin D, Ergocalciferol, (DRISDOL) 50000 UNITS CAPS Take 50,000 Units by mouth every 7 (seven) days.     Historical Provider, MD    Family History History reviewed. No pertinent family history.  Social History Social History  Substance Use Topics  . Smoking status: Never Smoker  . Smokeless tobacco: Never Used  . Alcohol use No     Comment: Occassional     Allergies   Review of patient's allergies indicates no known allergies.   Review of Systems Review of Systems   Physical Exam Updated Vital Signs BP (!) 122/49 (BP Location: Right Arm)   Pulse 92   Temp 99.1 F (37.3 C) (Oral)   Resp 18   Ht 5\' 6"  (1.676 m)   Wt 130.2 kg   LMP  (Exact Date)   SpO2 97%   BMI 46.32 kg/m   Physical Exam  Constitutional: Emily Holland is oriented to person, place, and time. Emily Holland appears well-developed and well-nourished. No distress.  HENT:  Head: Normocephalic and atraumatic.  Mouth/Throat: No oropharyngeal exudate.  Eyes: Conjunctivae and EOM are normal. Pupils are equal, round, and reactive to light. Right eye exhibits no discharge. Left eye exhibits no discharge. No scleral icterus.  Cardiovascular: Normal rate, regular rhythm, normal heart sounds and intact distal pulses.  Exam reveals no gallop and no friction rub.   No murmur heard. Pulmonary/Chest: Effort normal and breath sounds normal. No respiratory distress. Emily Holland has no wheezes. Emily Holland has no rales. Emily Holland exhibits no tenderness.    Abdominal: Soft. Emily Holland exhibits no distension. There is no tenderness. There is no guarding.  Genitourinary: Rectal exam shows guaiac negative stool.  Musculoskeletal: Normal range of motion. Emily Holland exhibits no edema.  Neurological: Emily Holland is alert and oriented to person, place, and time. No cranial nerve deficit.  Strength 4/5 throughout. No sensory deficits. No gait abnormality. No dysmetria. No slurred speech. No facial droop. Negative pronator drift.    Skin: Skin is warm and dry. No rash noted. Emily Holland is not diaphoretic. No erythema. No pallor.  Psychiatric: Emily Holland has a normal mood and affect. Her behavior is normal.  Nursing note and vitals reviewed.    ED Treatments / Results  Labs (all labs ordered are listed, but only abnormal  results are displayed) Labs Reviewed  CBC - Abnormal; Notable for the following:       Result Value   RBC 3.13 (*)    Hemoglobin 5.6 (*)    HCT 21.6 (*)    MCV 69.0 (*)    MCH 17.9 (*)    MCHC 25.9 (*)    RDW 23.7 (*)    Platelets 476 (*)    All other components within normal limits  COMPREHENSIVE METABOLIC PANEL - Abnormal; Notable for the following:    Glucose, Bld 140 (*)    Albumin 3.4 (*)    ALT 12 (*)    All other components within normal limits  RETICULOCYTES - Abnormal; Notable for the following:    RBC. 3.17 (*)    All other components within normal limits  VITAMIN B12  FOLATE  IRON AND TIBC  FERRITIN  RETICULOCYTES  BASIC METABOLIC PANEL  CBC  POC OCCULT BLOOD, ED  TYPE AND SCREEN  PREPARE RBC (CROSSMATCH)  ABO/RH    EKG  EKG Interpretation  Date/Time:  Tuesday March 17 2016 17:31:55 EDT Ventricular Rate:  74 PR Interval:    QRS Duration: 98 QT Interval:  393 QTC Calculation: 436 R Axis:   60 Text Interpretation:  Sinus rhythm Probable left ventricular hypertrophy ST elevation, consider lateral injury No significant change since last tracing Confirmed by ZACKOWSKI  MD, SCOTT ([id number]) on 03/17/2016 5:36:38 PM        Radiology No results found.  Procedures Procedures (including critical care time)  CRITICAL CARE Performed by: Dub Mikes   Total critical care time: 45 minutes  Critical care time was exclusive of separately billable procedures and treating other patients.  Critical care was necessary to treat or prevent imminent or life-threatening deterioration.  Critical care was time spent personally by me on the following activities: development of treatment plan with patient and/or surrogate as well as nursing, discussions with consultants, evaluation of patient's response to treatment, examination of patient, obtaining history from patient or surrogate, ordering and performing treatments and interventions, ordering and review of laboratory studies, ordering and review of radiographic studies, pulse oximetry and re-evaluation of patient's condition.   Medications Ordered in ED Medications  diphenhydrAMINE (BENADRYL) capsule 25 mg (not administered)  colchicine tablet 0.6 mg (not administered)  calcium carbonate (TUMS - dosed in mg elemental calcium) chewable tablet 200 mg of elemental calcium (not administered)  ondansetron (ZOFRAN) tablet 4 mg (not administered)    Or  ondansetron (ZOFRAN) injection 4 mg (not administered)  acetaminophen (TYLENOL) tablet 650 mg (not administered)    Or  acetaminophen (TYLENOL) suppository 650 mg (not administered)  albuterol (PROVENTIL) (2.5 MG/3ML) 0.083% nebulizer solution 2.5 mg (not administered)  0.9 %  sodium chloride infusion (not administered)  0.9 %  sodium chloride infusion (10 mL/hr Intravenous New Bag/Given 03/17/16 2023)     Initial Impression / Assessment and Plan / ED Course  I have reviewed the triage vital signs and the nursing notes.  Pertinent labs & imaging results that were available during my care of the patient were reviewed by me and considered in my medical decision making (see chart for details).  Clinical  Course    55 year old female with history of symptomatic anemia presents to the ED today with a hemoglobin of 5.6. Patient has been feeling weak and dizzy for the last month with intermittent chest pain and shortness of breath. On presentation ED patient appears very fatigued but is otherwise in no apparent distress.  Vitals are stable. Upon review of patient's records. Emily Holland was admitted to the hospital in January and March 2017 with extremely low hemoglobin. Emily Holland had full workup by GI including EGD, colonoscopy and capsule endoscopy which were negative. Hemoglobin today is 5.6. Emily Holland is guaiac negative. Emily Holland was transfused 2 units of blood. Spoke with Dr. Katrinka Blazing with [facility] who will admit patient to MedSurg. Recommend hematology consult.  Case discussed with Dr. Deretha Emory who agrees with treatment plan.  Patient is hemodynamically stable and awaiting bed placement.  Final Clinical Impressions(s) / ED Diagnoses   Final diagnoses:  Anemia, unspecified type    New Prescriptions New Prescriptions   No medications on file     Dub Mikes, PA-C 03/17/16 2152    Vanetta Mulders, MD 03/20/16 [phone]

## 2016-03-17 NOTE — ED Notes (Signed)
Blood consent signed and placed at bedside 

## 2016-03-17 NOTE — ED Notes (Signed)
Dr. Vinnie LevelMckuen notified of pt hemoglobin.

## 2016-03-17 NOTE — ED Notes (Signed)
Critical hemoglobin 5.6  Lab notifitaion. Charge nurse notified, will move to next open room.

## 2016-03-17 NOTE — ED Notes (Signed)
Report given to Colin BroachLateshia, RN at this time.  Receiving nurse denies having any further questions at this time.

## 2016-03-17 NOTE — ED Triage Notes (Signed)
Pt presents to ED for assessment after being seen at her PCP yesterday for her yearly physical. PCP contacted patient today and states "your blood is low".  Pt c/o feeling fatigued, but denies SOB or CP.

## 2016-03-18 DIAGNOSIS — K2970 Gastritis, unspecified, without bleeding: Secondary | ICD-10-CM | POA: Diagnosis not present

## 2016-03-18 DIAGNOSIS — M109 Gout, unspecified: Secondary | ICD-10-CM | POA: Diagnosis not present

## 2016-03-18 DIAGNOSIS — D62 Acute posthemorrhagic anemia: Secondary | ICD-10-CM | POA: Diagnosis not present

## 2016-03-18 DIAGNOSIS — Z90710 Acquired absence of both cervix and uterus: Secondary | ICD-10-CM | POA: Diagnosis not present

## 2016-03-18 DIAGNOSIS — K269 Duodenal ulcer, unspecified as acute or chronic, without hemorrhage or perforation: Secondary | ICD-10-CM | POA: Diagnosis not present

## 2016-03-18 DIAGNOSIS — R5383 Other fatigue: Secondary | ICD-10-CM | POA: Diagnosis not present

## 2016-03-18 DIAGNOSIS — K219 Gastro-esophageal reflux disease without esophagitis: Secondary | ICD-10-CM | POA: Diagnosis present

## 2016-03-18 DIAGNOSIS — D649 Anemia, unspecified: Secondary | ICD-10-CM | POA: Diagnosis not present

## 2016-03-18 DIAGNOSIS — M1389 Other specified arthritis, multiple sites: Secondary | ICD-10-CM | POA: Diagnosis not present

## 2016-03-18 DIAGNOSIS — D509 Iron deficiency anemia, unspecified: Secondary | ICD-10-CM | POA: Diagnosis not present

## 2016-03-18 LAB — BASIC METABOLIC PANEL
Anion gap: 6 (ref 5–15)
BUN: 10 mg/dL (ref 6–20)
CO2: 30 mmol/L (ref 22–32)
Calcium: 8.2 mg/dL — ABNORMAL LOW (ref 8.9–10.3)
Chloride: 102 mmol/L (ref 101–111)
Creatinine, Ser: 0.58 mg/dL (ref 0.44–1.00)
GFR calc Af Amer: >60 mL/min (ref >60)
GFR calc non Af Amer: >60 mL/min (ref >60)
Glucose, Bld: 84 mg/dL (ref 65–99)
Potassium: 3.5 mmol/L (ref 3.5–5.1)
Sodium: 138 mmol/L (ref 135–145)

## 2016-03-18 LAB — CBC
HCT: 23 % — ABNORMAL LOW (ref 36.0–46.0)
Hemoglobin: 6.5 g/dL — CL (ref 12.0–15.0)
MCH: 20.4 pg — ABNORMAL LOW (ref 26.0–34.0)
MCHC: 28.3 g/dL — ABNORMAL LOW (ref 30.0–36.0)
MCV: 72.3 fL — ABNORMAL LOW (ref 78.0–100.0)
Platelets: 351 10*3/uL (ref 150–400)
RBC: 3.18 MIL/uL — ABNORMAL LOW (ref 3.87–5.11)
RDW: 23.5 % — ABNORMAL HIGH (ref 11.5–15.5)
WBC: 7.4 10*3/uL (ref 4.0–10.5)

## 2016-03-18 LAB — PREPARE RBC (CROSSMATCH)

## 2016-03-18 LAB — HEMOGLOBIN AND HEMATOCRIT, BLOOD
HCT: 27.8 % — ABNORMAL LOW (ref 36.0–46.0)
Hemoglobin: 8 g/dL — ABNORMAL LOW (ref 12.0–15.0)

## 2016-03-18 LAB — FERRITIN: Ferritin: 10 ng/mL — ABNORMAL LOW (ref 11–307)

## 2016-03-18 LAB — IRON AND TIBC
Iron: 269 ug/dL — ABNORMAL HIGH (ref 28–170)
Saturation Ratios: 61 % — ABNORMAL HIGH (ref 10.4–31.8)
TIBC: 441 ug/dL (ref 250–450)
UIBC: 172 ug/dL

## 2016-03-18 MED ORDER — ALLOPURINOL 300 MG PO TABS
300.0000 mg | ORAL_TABLET | Freq: Every day | ORAL | Status: DC
  Administered 2016-03-18 – 2016-03-20 (×2): 300 mg via ORAL
  Filled 2016-03-18 (×2): qty 1

## 2016-03-18 MED ORDER — ADULT MULTIVITAMIN W/MINERALS CH
1.0000 | ORAL_TABLET | Freq: Every day | ORAL | Status: DC
  Administered 2016-03-18 – 2016-03-20 (×2): 1 via ORAL
  Filled 2016-03-18 (×2): qty 1

## 2016-03-18 MED ORDER — TRAMADOL HCL 50 MG PO TABS: 50.0000 mg | ORAL_TABLET | Freq: Four times a day (QID) | ORAL | Status: DC | PRN

## 2016-03-18 MED ORDER — FLUTICASONE PROPIONATE 50 MCG/ACT NA SUSP: 1.0000 | Freq: Two times a day (BID) | NASAL | Status: DC | PRN

## 2016-03-18 MED ORDER — PANTOPRAZOLE SODIUM 40 MG PO TBEC
40.0000 mg | DELAYED_RELEASE_TABLET | Freq: Every day | ORAL | Status: DC
  Administered 2016-03-18 – 2016-03-20 (×2): 40 mg via ORAL
  Filled 2016-03-18 (×2): qty 1

## 2016-03-18 MED ORDER — FERROUS SULFATE 325 (65 FE) MG PO TABS
325.0000 mg | ORAL_TABLET | Freq: Two times a day (BID) | ORAL | Status: DC
  Administered 2016-03-18 – 2016-03-20 (×4): 325 mg via ORAL
  Filled 2016-03-18 (×4): qty 1

## 2016-03-18 MED ORDER — OLOPATADINE HCL 0.1 % OP SOLN: 1.0000 [drp] | Freq: Two times a day (BID) | OPHTHALMIC | Status: DC | PRN

## 2016-03-18 NOTE — Progress Notes (Signed)
Responded to consult. Patient's pastor, Youth workerassistant pastor, and pastor's wife were in room visiting and praying with patient. We rejoiced together at her progress. Chaplain available for follow-up.    03/18/16 1400  Clinical Encounter Type  Visited With Patient;Other (Comment)  Visit Type Initial;Spiritual support  Referral From Nurse  Spiritual Encounters  Spiritual Needs Prayer;Other (Comment)

## 2016-03-18 NOTE — Consult Note (Signed)
Reason for Consult: IDA  Referring Physician: [facility]  Emily Holland HPI: [name] who is well-known to me for severe IDA.  She was admitted earlier in the year and extensive work up was pursued with an EGD/Colonoscoy and capsule endoscopy without any overt findings of bleeding.  Fecal occult blood has always been negative, but there were reports of black stools.  She was instructed to present to the hospital for admission when her HGB was noted to be in the 5 range.  She reported a generalized weakness that was progressive over several weeks.  Past Medical History:  Diagnosis Date  . Arthritis    "bones; joints; legs; ankles" (03/17/2016)  . GERD (gastroesophageal reflux disease)   . Gout   . History of blood transfusion 06/2015; 08/2015; 03/17/2016  . History of iron deficiency anemia    took iron supplements 06/2015-3/ 2017; stopped/dr's order (03/17/2016)  . Low hemoglobin 06/2015; 08/2015; 03/17/2016  . Shortness of breath on exertion 06/03/2011  . Symptomatic anemia 06/2015; 08/2015; 03/17/2016    Past Surgical History:  Procedure Laterality Date  . CERVICAL CONIZATION W/BX  08/2004  . COLONOSCOPY N/A 06/13/2015   Procedure: COLONOSCOPY;  Surgeon: Jeani HawkingPatrick Alyse Kathan, MD;  Location: [facility];  Service: Endoscopy;  Laterality: N/A;  . ESOPHAGOGASTRODUODENOSCOPY N/A 06/13/2015   Procedure: ESOPHAGOGASTRODUODENOSCOPY (EGD)/Colonoscopy;  Surgeon: Jeani HawkingPatrick Kymora Sciara, MD;  Location: [facility];  Service: Endoscopy;  Laterality: N/A;  . GIVENS CAPSULE STUDY N/A 06/14/2015   Procedure: GIVENS CAPSULE STUDY;  Surgeon: Jeani HawkingPatrick Kahli Mayon, MD;  Location: [facility];  Service: Endoscopy;  Laterality: N/A;  . VAGINAL HYSTERECTOMY  ~ 2005    History reviewed. No pertinent family history.  Social History:  reports that she has never smoked. She has never used smokeless tobacco. She reports that she does not drink alcohol or use drugs.  Allergies: No Known  Allergies  Medications:  Scheduled: . allopurinol  300 mg Oral Daily  . ferrous sulfate  325 mg Oral BID WC  . multivitamin with minerals  1 tablet Oral Daily  . pantoprazole  40 mg Oral Daily   Continuous: . sodium chloride 75 mL/hr at 03/18/16 0139    Results for orders placed or performed during the hospital encounter of 03/17/16 (from the past 24 hour(s))  POC occult blood, ED Provider will collect     Status: None   Collection Time: 03/17/16  8:40 PM  Result Value Ref Range   Fecal Occult Bld NEGATIVE NEGATIVE  Reticulocytes     Status: Abnormal   Collection Time: 03/17/16  9:02 PM  Result Value Ref Range   Retic Ct Pct 1.2 0.4 - 3.1 %   RBC. 3.17 (L) 3.87 - 5.11 MIL/uL   Retic Count, Manual 38.0 19.0 - 186.0 K/uL  Basic metabolic panel     Status: Abnormal   Collection Time: 03/18/16  4:43 AM  Result Value Ref Range   Sodium 138 135 - 145 mmol/L   Potassium 3.5 3.5 - 5.1 mmol/L   Chloride 102 101 - 111 mmol/L   CO2 30 22 - 32 mmol/L   Glucose, Bld 84 65 - 99 mg/dL   BUN 10 6 - 20 mg/dL   Creatinine, Ser 7.820.58 0.44 - 1.00 mg/dL   Calcium 8.2 (L) 8.9 - 10.3 mg/dL   GFR calc non Af Amer >60 >60 mL/min   GFR calc Af Amer >60 >60 mL/min   Anion gap 6 5 - 15  CBC  Status: Abnormal   Collection Time: 03/18/16  4:43 AM  Result Value Ref Range   WBC 7.4 4.0 - 10.5 K/uL   RBC 3.18 (L) 3.87 - 5.11 MIL/uL   Hemoglobin 6.5 (LL) 12.0 - 15.0 g/dL   HCT 16.1 (L) 09.6 - 04.5 %   MCV 72.3 (L) 78.0 - 100.0 fL   MCH 20.4 (L) 26.0 - 34.0 pg   MCHC 28.3 (L) 30.0 - 36.0 g/dL   RDW 40.9 (H) 81.1 - 91.4 %   Platelets 351 150 - 400 K/uL  Prepare RBC     Status: None   Collection Time: 03/18/16  6:08 AM  Result Value Ref Range   Order Confirmation ORDER PROCESSED BY BLOOD BANK   BLOOD TRANSFUSION REPORT - SCANNED     Status: None   Collection Time: 03/18/16  1:38 PM   Narrative   Ordered by an unspecified provider.  Ferritin     Status: Abnormal   Collection Time: 03/18/16   3:58 PM  Result Value Ref Range   Ferritin 10 (L) 11 - 307 ng/mL  Iron and TIBC     Status: Abnormal   Collection Time: 03/18/16  3:58 PM  Result Value Ref Range   Iron 269 (H) 28 - 170 ug/dL   TIBC 782 956 - 213 ug/dL   Saturation Ratios 61 (H) 10.4 - 31.8 %   UIBC 172 ug/dL     No results found.  ROS:  As stated above in the HPI otherwise negative.  Blood pressure (!) 97/41, pulse 65, temperature 98.8 F (37.1 C), temperature source Oral, resp. rate 18, height 5\' 6"  (1.676 m), weight 130.2 kg (287 lb), SpO2 97 %.    PE: Gen: NAD, Alert and Oriented HEENT:  [place]/AT, EOMI Neck: Supple, no LAD Lungs: CTA Bilaterally CV: RRR without M/G/R ABM: Soft, NTND, +BS Ext: No C/C/E  Assessment/Plan: 1) Severe IDA. 2) Symptomatic anemia.   I do not know the source of her anemia.  She has undergone extensive work up.  The highest yield at this time will be to repeat the capsule endoscopy.  If no overt bleeding can be identified, a Hematology consultation will be appropriate.  She may require iron infusions to keep her HGB stable.  Plan: 1) Capsule endoscopy. 2) Follow HGB. 3) Transfuse as necessary.  [name] 03/18/2016, 6:19 PM

## 2016-03-18 NOTE — Progress Notes (Signed)
PROGRESS NOTE  Emily Holland  ZOX:[id number] DOB: [birth year] DOA: 03/17/2016 PCP: [name], MD  Outpatient Specialists: Dr. Elnoria Howard, GI  Brief Narrative: Emily Holland is a 55 y.o. female with a history of anemia, duodenal erosion, and gout sent to the ED by her PCP for low blood counts. She has presented there for evaluation of several weeks of worsening fatigue, occasional light-headedness. Also reports intermittent dark, formed stools and has not been taking iron. She has been admitted back in January and March of this year with similar complaints which she was evaluated with EGD, colonoscopy, and capsule endoscopy. She was found to have a small duodenal erosion, but no other acute source of to account for the bleeding. In the ED she was afebrile, BP 106/45, HR 70, 98% SpO2. GI exam was normal with a negative FOBT. Hgb was 5.6. 2 units were ordered for transfusion and she was admitted for further evaluation. Her gastroenterologist, Dr. Elnoria Howard, was called 10/11 and plans to see her.   Assessment & Plan: Principal Problem:   Symptomatic anemia Active Problems:   Gout   GERD (gastroesophageal reflux disease)  Symptomatic anemia: Acute on chronic, microcytic. No iron studies drawn prior to transfusions. Presumed GI source or occult blood loss. Initially guaic negative.  - Clear liquid today, NPO after midnight for possible procedure - Dr. Elnoria Howard to see, planning capsule endoscopy. - Follow up hgb 6.5 s/p 2 u, 3rd unit ordered to be transfused. Transfuse for hgb < 7. Higher threshold may be necessary if there is stronger evidence of ongoing GI losses. - Attempting to add on anemia panel.  - Previous iron studies in March show iron 17, TIBC 489, 3% sat, Ferritin 6. Restart po iron and multivitamin.  - If gastroenterology workup again negative, will contact hematology  Gout: Chronic, not in acute flare - Continue allopurinol and colchicine.  GERD: - Continue daily  protonix  DVT prophylaxis: SCDs Code Status: Full Family Communication: No family at bedside.  Disposition Plan: Continued evaluation of anemia cause and treatment of symptomatic anemia on med-surg floor. Disposition back to home likely, duration unknown.  Consultants:   GI, Dr. Elnoria Howard  Procedures:   Capsule endoscopy 10/11  Antimicrobials:  None   Subjective: Pt feels better, still occasionally light-headed when moving around in bed. No palpitations, dyspnea, chest pain. No abd pain, minimal nausea, no vomiting or diarrhea.   Objective: Vitals:   03/18/16 0559 03/18/16 1045 03/18/16 1109 03/18/16 1320  BP: (!) 110/40 (!) 122/50 (!) 125/57 (!) 110/49  Pulse: 64 69 62 (!) 106  Resp: (!) 22 (!) 21 20 17   Temp: 98.5 F (36.9 C) 98.3 F (36.8 C) 98.2 F (36.8 C) 97.9 F (36.6 C)  TempSrc: Oral Oral Oral Oral  SpO2: 92% 97% 96% 96%  Weight:      Height:        Intake/Output Summary (Last 24 hours) at 03/18/16 1423 Last data filed at 03/18/16 1320  Gross per 24 hour  Intake           1172.5 ml  Output             1100 ml  Net             72.5 ml   Filed Weights   03/17/16 1512  Weight: 130.2 kg (287 lb)    Examination: General exam: 55 y.o. female in no distress Respiratory system: Non-labored breathing. Clear to auscultation bilaterally.  Cardiovascular system: Regular rate and rhythm.  No murmur, rub, or gallop. No JVD, and no pedal edema. Gastrointestinal system: Abdomen soft, non-tender, non-distended, with normoactive bowel sounds. No organomegaly or masses felt. Central nervous system: Alert and oriented. No focal neurological deficits. Extremities: Warm, no deformities Skin: Diffuse pallor. No rashes, lesions or ulcers Psychiatry: Judgement and insight appear normal. Mood & affect appropriate.   Data Reviewed: I have personally reviewed following labs and imaging studies  CBC:  Recent Labs Lab 03/17/16 1512 03/18/16 0443  WBC 7.7 7.4  HGB 5.6* 6.5*   HCT 21.6* 23.0*  MCV 69.0* 72.3*  PLT 476* 351   Basic Metabolic Panel:  Recent Labs Lab 03/17/16 1512 03/18/16 0443  NA 139 138  K 4.1 3.5  CL 102 102  CO2 29 30  GLUCOSE 140* 84  BUN 12 10  CREATININE 0.78 0.58  CALCIUM 9.1 8.2*   GFR: Estimated Creatinine Clearance: 108.7 mL/min (by C-G formula based on SCr of 0.58 mg/dL). Liver Function Tests:  Recent Labs Lab 03/17/16 1512  AST 22  ALT 12*  ALKPHOS 69  BILITOT 0.4  PROT 7.8  ALBUMIN 3.4*   No results for input(s): LIPASE, AMYLASE in the last 168 hours. No results for input(s): AMMONIA in the last 168 hours. Coagulation Profile: No results for input(s): INR, PROTIME in the last 168 hours. Cardiac Enzymes: No results for input(s): CKTOTAL, CKMB, CKMBINDEX, TROPONINI in the last 168 hours. BNP (last 3 results) No results for input(s): PROBNP in the last 8760 hours. HbA1C: No results for input(s): HGBA1C in the last 72 hours. CBG: No results for input(s): GLUCAP in the last 168 hours. Lipid Profile: No results for input(s): CHOL, HDL, LDLCALC, TRIG, CHOLHDL, LDLDIRECT in the last 72 hours. Thyroid Function Tests: No results for input(s): TSH, T4TOTAL, FREET4, T3FREE, THYROIDAB in the last 72 hours. Anemia Panel:  Recent Labs  03/17/16 2102  RETICCTPCT 1.2   Urine analysis:    Component Value Date/Time   COLORURINE YELLOW 08/16/2015 2141   APPEARANCEUR CLOUDY (A) 08/16/2015 2141   LABSPEC 1.019 08/16/2015 2141   PHURINE 7.0 08/16/2015 2141   GLUCOSEU NEGATIVE 08/16/2015 2141   HGBUR NEGATIVE 08/16/2015 2141   BILIRUBINUR NEGATIVE 08/16/2015 2141   KETONESUR NEGATIVE 08/16/2015 2141   PROTEINUR NEGATIVE 08/16/2015 2141   NITRITE NEGATIVE 08/16/2015 2141   LEUKOCYTESUR NEGATIVE 08/16/2015 2141   Sepsis Labs: @LABRCNTIP (procalcitonin:4,lacticidven:4)  )No results found for this or any previous visit (from the past 240 hour(s)).   Radiology Studies: No results found.  Scheduled Meds: .  allopurinol  300 mg Oral Daily  . ferrous sulfate  325 mg Oral BID WC  . multivitamin with minerals  1 tablet Oral Daily  . pantoprazole  40 mg Oral Daily   Continuous Infusions: . sodium chloride 75 mL/hr at 03/18/16 0139     LOS: 0 days   Time spent: 25 minutes.  Hazeline Junkeryan Aarin Bluett, MD [facility] Pager [phone]  If 7PM-7AM, please contact night-coverage [email or web address] Password [email or web address] 03/18/2016, 2:23 PM

## 2016-03-19 ENCOUNTER — Encounter (HOSPITAL_COMMUNITY)
Admission: EM | Disposition: A | Discharge status: Home / Self Care | Payer: Self-pay | Source: Home / Self Care | Attending: Emergency Medicine

## 2016-03-19 DIAGNOSIS — K2970 Gastritis, unspecified, without bleeding: Secondary | ICD-10-CM | POA: Diagnosis not present

## 2016-03-19 DIAGNOSIS — K219 Gastro-esophageal reflux disease without esophagitis: Secondary | ICD-10-CM | POA: Diagnosis not present

## 2016-03-19 DIAGNOSIS — M109 Gout, unspecified: Secondary | ICD-10-CM | POA: Diagnosis not present

## 2016-03-19 DIAGNOSIS — M1389 Other specified arthritis, multiple sites: Secondary | ICD-10-CM | POA: Diagnosis not present

## 2016-03-19 DIAGNOSIS — K269 Duodenal ulcer, unspecified as acute or chronic, without hemorrhage or perforation: Secondary | ICD-10-CM | POA: Diagnosis not present

## 2016-03-19 DIAGNOSIS — R5383 Other fatigue: Secondary | ICD-10-CM | POA: Diagnosis not present

## 2016-03-19 DIAGNOSIS — D62 Acute posthemorrhagic anemia: Secondary | ICD-10-CM | POA: Diagnosis not present

## 2016-03-19 DIAGNOSIS — Z90710 Acquired absence of both cervix and uterus: Secondary | ICD-10-CM | POA: Diagnosis not present

## 2016-03-19 DIAGNOSIS — D649 Anemia, unspecified: Secondary | ICD-10-CM | POA: Diagnosis not present

## 2016-03-19 DIAGNOSIS — D509 Iron deficiency anemia, unspecified: Secondary | ICD-10-CM | POA: Diagnosis not present

## 2016-03-19 HISTORY — PX: GIVENS CAPSULE STUDY: SHX5432

## 2016-03-19 LAB — TYPE AND SCREEN
ABO/RH(D): B POS
Antibody Screen: NEGATIVE
Unit division: 0
Unit division: 0
Unit division: 0
Unit division: 0

## 2016-03-19 SURGERY — IMAGING PROCEDURE, GI TRACT, INTRALUMINAL, VIA CAPSULE
Anesthesia: LOCAL

## 2016-03-19 SURGICAL SUPPLY — 1 items: TOWEL COTTON PACK 4EA (MISCELLANEOUS) ×4 IMPLANT

## 2016-03-19 NOTE — Progress Notes (Signed)
PROGRESS NOTE  Emily Holland  XBJ:[id number] DOB: 09/20/1960 DOA: 03/17/2016 PCP: Dorrene GermanEdwin A Avbuere, MD  Outpatient Specialists: Dr. Elnoria HowardHung, GI  Brief Narrative: Emily Holland is a 55 y.o. female with a history of anemia, duodenal erosion, and gout sent to the ED by her PCP for low blood counts. She has presented there for evaluation of several weeks of worsening fatigue, occasional light-headedness. Also reports intermittent dark, formed stools and has not been taking iron. She has been admitted back in January and March of this year with similar complaints which she was evaluated with EGD, colonoscopy, and capsule endoscopy. She was found to have a small duodenal erosion, but no other acute source of to account for the bleeding. In the ED she was afebrile, BP 106/45, HR 70, 98% SpO2. GI exam was normal with a negative FOBT. Hgb was 5.6. 2 units were ordered for transfusion and she was admitted for further evaluation. Her gastroenterologist, Dr. Elnoria HowardHung, was consulted and is performing capsule endoscopy 10/12.   Assessment & Plan: Principal Problem:   Symptomatic anemia Active Problems:   Gout   GERD (gastroesophageal reflux disease)  Symptomatic anemia: Acute on chronic, microcytic. No iron studies drawn prior to transfusions. Presumed GI source or occult blood loss. Initially guaic negative.  - Clear liquid today, NPO after midnight for possible procedure - Dr. Elnoria HowardHung to see, planning capsule endoscopy. - Hgb 8 s/p 3u PRBCs. Transfuse for hgb < 7. Higher threshold may be necessary if there is stronger evidence of ongoing GI losses. - Previous iron studies in March show iron 17, TIBC 489, 3% sat, Ferritin 6. Iron studies were drawn following transfusion.  - Restart po iron and multivitamin.  - If gastroenterology workup again negative, will contact hematology  Gout: Chronic, not in acute flare - Continue allopurinol and colchicine.  GERD: - Continue daily protonix  DVT  prophylaxis: SCDs Code Status: Full Family Communication: No family at bedside.  Disposition Plan: Continued evaluation of anemia cause and treatment of symptomatic anemia on med-surg floor. Disposition back to home likely, duration unknown.  Consultants:   GI, Dr. Elnoria HowardHung  Procedures:   Capsule endoscopy 10/12  Antimicrobials:  None   Subjective: Pt feels better, still occasionally light-headed when moving around in bed. No palpitations, dyspnea, chest pain. No abd pain, minimal nausea, no vomiting or diarrhea. Currently NPO.  Objective: Vitals:   03/18/16 1320 03/18/16 1453 03/18/16 2212 03/19/16 0522  BP: (!) 110/49 (!) 97/41 (!) 114/37 (!) 98/49  Pulse: (!) 106 65 65 (!) 59  Resp: 17 18 18 18   Temp: 97.9 F (36.6 C) 98.8 F (37.1 C) 98 F (36.7 C) 98.1 F (36.7 C)  TempSrc: Oral Oral Oral Oral  SpO2: 96% 97% 93% 97%  Weight:      Height:        Intake/Output Summary (Last 24 hours) at 03/19/16 1156 Last data filed at 03/19/16 0941  Gross per 24 hour  Intake            807.5 ml  Output             1400 ml  Net           -592.5 ml   Filed Weights   03/17/16 1512  Weight: 130.2 kg (287 lb)    Examination: General exam: 55 y.o. female in no distress Respiratory system: Non-labored breathing. Clear to auscultation bilaterally.  Cardiovascular system: Regular rate and rhythm. No murmur, rub, or gallop. No JVD, and no  pedal edema. Gastrointestinal system: Abdomen soft, non-tender, non-distended, with normoactive bowel sounds. No organomegaly or masses felt. Central nervous system: Alert and oriented. No focal neurological deficits. Extremities: Warm, no deformities Skin: Diffuse pallor. No rashes, lesions or ulcers Psychiatry: Judgement and insight appear normal. Mood & affect appropriate.   Data Reviewed: I have personally reviewed following labs and imaging studies  CBC:  Recent Labs Lab 03/17/16 1512 03/18/16 0443 03/18/16 1952  WBC 7.7 7.4  --   HGB  5.6* 6.5* 8.0*  HCT 21.6* 23.0* 27.8*  MCV 69.0* 72.3*  --   PLT 476* 351  --    Basic Metabolic Panel:  Recent Labs Lab 03/17/16 1512 03/18/16 0443  NA 139 138  K 4.1 3.5  CL 102 102  CO2 29 30  GLUCOSE 140* 84  BUN 12 10  CREATININE 0.78 0.58  CALCIUM 9.1 8.2*   GFR: Estimated Creatinine Clearance: 108.7 mL/min (by C-G formula based on SCr of 0.58 mg/dL). Liver Function Tests:  Recent Labs Lab 03/17/16 1512  AST 22  ALT 12*  ALKPHOS 69  BILITOT 0.4  PROT 7.8  ALBUMIN 3.4*   No results for input(s): LIPASE, AMYLASE in the last 168 hours. No results for input(s): AMMONIA in the last 168 hours. Coagulation Profile: No results for input(s): INR, PROTIME in the last 168 hours. Cardiac Enzymes: No results for input(s): CKTOTAL, CKMB, CKMBINDEX, TROPONINI in the last 168 hours. BNP (last 3 results) No results for input(s): PROBNP in the last 8760 hours. HbA1C: No results for input(s): HGBA1C in the last 72 hours. CBG: No results for input(s): GLUCAP in the last 168 hours. Lipid Profile: No results for input(s): CHOL, HDL, LDLCALC, TRIG, CHOLHDL, LDLDIRECT in the last 72 hours. Thyroid Function Tests: No results for input(s): TSH, T4TOTAL, FREET4, T3FREE, THYROIDAB in the last 72 hours. Anemia Panel:  Recent Labs  03/17/16 2102 03/18/16 1558  FERRITIN  --  10*  TIBC  --  441  IRON  --  269*  RETICCTPCT 1.2  --    Urine analysis:    Component Value Date/Time   COLORURINE YELLOW 08/16/2015 2141   APPEARANCEUR CLOUDY (A) 08/16/2015 2141   LABSPEC 1.019 08/16/2015 2141   PHURINE 7.0 08/16/2015 2141   GLUCOSEU NEGATIVE 08/16/2015 2141   HGBUR NEGATIVE 08/16/2015 2141   BILIRUBINUR NEGATIVE 08/16/2015 2141   KETONESUR NEGATIVE 08/16/2015 2141   PROTEINUR NEGATIVE 08/16/2015 2141   NITRITE NEGATIVE 08/16/2015 2141   LEUKOCYTESUR NEGATIVE 08/16/2015 2141   Sepsis Labs: @LABRCNTIP (procalcitonin:4,lacticidven:4)  )No results found for this or any  previous visit (from the past 240 hour(s)).   Radiology Studies: No results found.  Scheduled Meds: . allopurinol  300 mg Oral Daily  . ferrous sulfate  325 mg Oral BID WC  . multivitamin with minerals  1 tablet Oral Daily  . pantoprazole  40 mg Oral Daily   Continuous Infusions: . sodium chloride 75 mL/hr at 03/18/16 0139     LOS: 0 days   Time spent: 25 minutes.  Hazeline Junker, MD [facility] Pager [phone]  If 7PM-7AM, please contact night-coverage [email or web address] Password [email or web address] 03/19/2016, 11:56 AM

## 2016-03-19 NOTE — Evaluation (Signed)
Physical Therapy Evaluation & Discharge Patient Details Name: Emily Holland MRN: [id number] DOB: 05/14/1961 Today's Date: 03/19/2016   History of Present Illness  Emily Holland is a 55 y.o. female with medical history significant of anemia and gout Associated with reports of intermittent black stools, headaches, and shortness of breath.  Hemoglobin in ED 5.6 now s/p 2 units of blood.  Currently undergoing capsule endoscopy.  Clinical Impression  Patient presents close to her functional baseline.  Seems slightly weaker due to recent anemia and now NPO.  Encouraged to walk with assist in hospital (family okay) and to gradually rise when first home as well as have supervision for showering.  Discussed shower seat for home, pt prefers to get on her own. No further skilled PT needs at this time.      Follow Up Recommendations No PT follow up    Equipment Recommendations  Other (comment) (toilet riser seat for home)    Recommendations for Other Services       Precautions / Restrictions Precautions Precautions: Fall Precaution Comments: has used cane for about a year Restrictions Weight Bearing Restrictions: No      Mobility  Bed Mobility               General bed mobility comments: sitting on EOB  Transfers Overall transfer level: Modified independent Equipment used: Quad cane                Ambulation/Gait Ambulation/Gait assistance: Supervision Ambulation Distance (Feet): 160 Feet Assistive device: Quad cane Gait Pattern/deviations: Step-through pattern;Decreased stride length;Wide base of support;Shuffle     General Gait Details: increased lateral excursion with wide BOS, supervision for safety, pt does endorse weakness  Stairs            Wheelchair Mobility    Modified Rankin (Stroke Patients Only)       Balance Overall balance assessment: Needs assistance   Sitting balance-Leahy Scale: Good     Standing balance support: No  upper extremity supported Standing balance-Leahy Scale: Good Standing balance comment: stands without UE support and can take steps, but feels more secure with cane                             Pertinent Vitals/Pain Pain Assessment: No/denies pain    Home Living Family/patient expects to be discharged to:: Private residence Living Arrangements: Spouse/significant other;Other relatives Available Help at Discharge: Family;Available PRN/intermittently Type of Home: House Home Access: Stairs to enter   [employer] Corporation[employer]trance Stairs-Number of Steps: 1 Home Layout: One level Home Equipment: Cane - quad;Grab bars - tub/shower Additional Comments: does report one fall about 3 months ago when lost her balance, was able to get up some, then grandson able to assist    Prior Function Level of Independence: Independent with assistive device(s)         Comments: uses cane, slower doing ADL's like cooking.     Hand Dominance   Dominant Hand: Right    Extremity/Trunk Assessment   Upper Extremity Assessment: Overall WFL for tasks assessed           Lower Extremity Assessment: Overall WFL for tasks assessed         Communication   Communication: No difficulties  Cognition Arousal/Alertness: Awake/alert Behavior During Therapy: WFL for tasks assessed/performed Overall Cognitive Status: Within Functional Limits for tasks assessed  General Comments General comments (skin integrity, edema, etc.): cane in R hand oriented for L, attempted to rotate base, but pin on opposite side not functioning to hold in position to reoriented to original position, pt used without difficutly    Exercises     Assessment/Plan    PT Assessment Patent does not need any further PT services  PT Problem List            PT Treatment Interventions      PT Goals (Current goals can be found in the Care Plan section)  Acute Rehab PT Goals PT Goal Formulation: All  assessment and education complete, DC therapy    Frequency     Barriers to discharge        Co-evaluation               End of Session Equipment Utilized During Treatment: Gait belt Activity Tolerance: Patient tolerated treatment well Patient left: in bed;with call bell/phone within reach      Functional Assessment Tool Used: Clinical Judgement Functional Limitation: Mobility: Walking and moving around Mobility: Walking and Moving Around Current Status (W0981): At least 1 percent but less than 20 percent impaired, limited or restricted Mobility: Walking and Moving Around Goal Status [phone]): At least 1 percent but less than 20 percent impaired, limited or restricted Mobility: Walking and Moving Around Discharge Status [phone]): At least 1 percent but less than 20 percent impaired, limited or restricted    Time: 0902-0921 PT Time Calculation (min) (ACUTE ONLY): 19 min   Charges:   PT Evaluation $PT Eval Moderate Complexity: 1 Procedure     PT G Codes:   PT G-Codes **NOT FOR INPATIENT CLASS** Functional Assessment Tool Used: Clinical Judgement Functional Limitation: Mobility: Walking and moving around Mobility: Walking and Moving Around Current Status (O1308): At least 1 percent but less than 20 percent impaired, limited or restricted Mobility: Walking and Moving Around Goal Status [phone]): At least 1 percent but less than 20 percent impaired, limited or restricted Mobility: Walking and Moving Around Discharge Status [phone]): At least 1 percent but less than 20 percent impaired, limited or restricted    Elray Mcgregor 03/19/2016, 9:38 AM Sheran Lawless, PT [phone] 03/19/2016

## 2016-03-19 NOTE — Progress Notes (Signed)
Subjective: No complaints.  Feeling better s/p blood transfusions.  Objective: Vital signs in last 24 hours: Temp:  [98 F (36.7 C)-98.8 F (37.1 C)] 98.3 F (36.8 C) (10/12 1440) Pulse Rate:  [58-65] 58 (10/12 1440) Resp:  [16-18] 16 (10/12 1440) BP: (97-114)/(37-49) 106/43 (10/12 1440) SpO2:  [93 %-100 %] 100 % (10/12 1440) Last BM Date: 03/18/16  Intake/Output from previous day: 10/11 0701 - 10/12 0700 In: 1317.5 [P.O.:960; Blood:357.5] Out: 1400 [Urine:1400] Intake/Output this shift: Total I/O In: 0  Out: 300 [Urine:300]  General appearance: alert and no distress GI: soft, non-tender; bowel sounds normal; no masses,  no organomegaly  Lab Results:  Recent Labs  03/17/16 1512 03/18/16 0443 03/18/16 1952  WBC 7.7 7.4  --   HGB 5.6* 6.5* 8.0*  HCT 21.6* 23.0* 27.8*  PLT 476* 351  --    BMET  Recent Labs  03/17/16 1512 03/18/16 0443  NA 139 138  K 4.1 3.5  CL 102 102  CO2 29 30  GLUCOSE 140* 84  BUN 12 10  CREATININE 0.78 0.58  CALCIUM 9.1 8.2*   LFT  Recent Labs  03/17/16 1512  PROT 7.8  ALBUMIN 3.4*  AST 22  ALT 12*  ALKPHOS 69  BILITOT 0.4   PT/INR No results for input(s): LABPROT, INR in the last 72 hours. Hepatitis Panel No results for input(s): HEPBSAG, HCVAB, HEPAIGM, HEPBIGM in the last 72 hours. C-Diff No results for input(s): CDIFFTOX in the last 72 hours. Fecal Lactopherrin No results for input(s): FECLLACTOFRN in the last 72 hours.  Studies/Results: No results found.  Medications:  Scheduled: . allopurinol  300 mg Oral Daily  . ferrous sulfate  325 mg Oral BID WC  . multivitamin with minerals  1 tablet Oral Daily  . pantoprazole  40 mg Oral Daily   Continuous: . sodium chloride 75 mL/hr at 03/18/16 0139    Assessment/Plan: 1) Severe IDA - Capsule endoscopy is taking place.  I will read the study tomorrow.  LOS: 0 days   Emily Holland D 03/19/2016, 2:45 PM

## 2016-03-19 NOTE — Progress Notes (Signed)
Patient swallowed capsule with no problems. Patient with understanding of NPO status and when patient can have liquids and solids. Recorder and leads may be removed @ 2020 this evening.

## 2016-03-20 ENCOUNTER — Encounter (HOSPITAL_COMMUNITY): Payer: Self-pay | Admitting: Gastroenterology

## 2016-03-20 DIAGNOSIS — D62 Acute posthemorrhagic anemia: Secondary | ICD-10-CM | POA: Diagnosis not present

## 2016-03-20 DIAGNOSIS — D649 Anemia, unspecified: Secondary | ICD-10-CM | POA: Diagnosis not present

## 2016-03-20 DIAGNOSIS — K219 Gastro-esophageal reflux disease without esophagitis: Secondary | ICD-10-CM | POA: Diagnosis not present

## 2016-03-20 DIAGNOSIS — Z90710 Acquired absence of both cervix and uterus: Secondary | ICD-10-CM | POA: Diagnosis not present

## 2016-03-20 DIAGNOSIS — M1389 Other specified arthritis, multiple sites: Secondary | ICD-10-CM | POA: Diagnosis not present

## 2016-03-20 DIAGNOSIS — M109 Gout, unspecified: Secondary | ICD-10-CM | POA: Diagnosis not present

## 2016-03-20 DIAGNOSIS — D509 Iron deficiency anemia, unspecified: Secondary | ICD-10-CM | POA: Diagnosis not present

## 2016-03-20 DIAGNOSIS — K269 Duodenal ulcer, unspecified as acute or chronic, without hemorrhage or perforation: Secondary | ICD-10-CM | POA: Diagnosis not present

## 2016-03-20 DIAGNOSIS — K2970 Gastritis, unspecified, without bleeding: Secondary | ICD-10-CM | POA: Diagnosis not present

## 2016-03-20 LAB — BASIC METABOLIC PANEL
Anion gap: 6 (ref 5–15)
BUN: 6 mg/dL (ref 6–20)
CO2: 31 mmol/L (ref 22–32)
Calcium: 8.2 mg/dL — ABNORMAL LOW (ref 8.9–10.3)
Chloride: 103 mmol/L (ref 101–111)
Creatinine, Ser: 0.7 mg/dL (ref 0.44–1.00)
GFR calc Af Amer: >60 mL/min (ref >60)
GFR calc non Af Amer: >60 mL/min (ref >60)
Glucose, Bld: 92 mg/dL (ref 65–99)
Potassium: 3.7 mmol/L (ref 3.5–5.1)
Sodium: 140 mmol/L (ref 135–145)

## 2016-03-20 LAB — CBC
HCT: 27.5 % — ABNORMAL LOW (ref 36.0–46.0)
Hemoglobin: 7.8 g/dL — ABNORMAL LOW (ref 12.0–15.0)
MCH: 21.3 pg — ABNORMAL LOW (ref 26.0–34.0)
MCHC: 28.4 g/dL — ABNORMAL LOW (ref 30.0–36.0)
MCV: 74.9 fL — ABNORMAL LOW (ref 78.0–100.0)
Platelets: 340 10*3/uL (ref 150–400)
RBC: 3.67 MIL/uL — ABNORMAL LOW (ref 3.87–5.11)
RDW: 24.9 % — ABNORMAL HIGH (ref 11.5–15.5)
WBC: 7.6 10*3/uL (ref 4.0–10.5)

## 2016-03-20 MED ORDER — SENNOSIDES-DOCUSATE SODIUM 8.6-50 MG PO TABS
1.0000 | ORAL_TABLET | Freq: Every day | ORAL | Status: DC
  Administered 2016-03-20: 1 via ORAL
  Filled 2016-03-20: qty 1

## 2016-03-20 MED ORDER — FERROUS SULFATE 325 (65 FE) MG PO TABS: 325.0000 mg | ORAL_TABLET | Freq: Two times a day (BID) | ORAL | 0 refills | Status: AC

## 2016-03-20 MED ORDER — BISACODYL 10 MG RE SUPP: 10.0000 mg | Freq: Every day | RECTAL | Status: DC | PRN

## 2016-03-20 MED ORDER — POLYETHYLENE GLYCOL 3350 17 G PO PACK
17.0000 g | PACK | Freq: Every day | ORAL | Status: DC
Start: 2016-03-20 — End: 2016-03-20
  Administered 2016-03-20: 17 g via ORAL
  Filled 2016-03-20: qty 1

## 2016-03-20 NOTE — Discharge Summary (Signed)
Physician Discharge Summary  Emily Fittingvelyne Togba Rickel JYN:[id number] DOB: 05/19/1961 DOA: 03/17/2016  PCP: Dorrene GermanEdwin A Avbuere, MD  Admit date: 03/17/2016 Discharge date: 03/20/2016  Admitted From: Home Disposition: Home   Recommendations for Outpatient Follow-up:  1. Follow up with PCP in 1-2 weeks 2. Please obtain CBC, iron studies, reticulocyte count, peripheral smear.  3. Patient will [facility] for IDA, per recommendation of Dr. Myna HidalgoEnnever.  Home Health: None Equipment/Devices: None  Discharge Condition: Stable CODE STATUS: Full Diet recommendation: Regular  Brief/Interim Summary: Emily Holland is a [age] y.o. female with a history of anemia, duodenal erosion, and gout sent to the ED by her PCP for low blood counts. She has presented there for evaluation of several weeks of worsening fatigue, occasional light-headedness. Also reports intermittent dark, formed stools and has not been taking iron. She has been admitted back in January and March of this year with similar complaints which she was evaluated with EGD, colonoscopy, and capsule endoscopy. She was found to have a small duodenal erosion, but no other acute source of to account for the bleeding. In the ED she was afebrile, BP 106/45, HR 70, 98% SpO2. GI exam was normal with a negative FOBT. Hgb was 5.6. 2 units were ordered for transfusion and she was admitted for further evaluation. Her gastroenterologist, Dr. Elnoria HowardHung, was consulted and performed capsule endoscopy 10/12 which showed no evidence of bleeding source. Case was discussed with Dr. Myna HidalgoEnnever, hematology on-call, who suggests outpatient follow up. New [place] and she was discharged on iron tablets daily.   Discharge Diagnoses:  Principal Problem:   Symptomatic anemia Active Problems:   Gout   GERD (gastroesophageal reflux disease)  Discharge Instructions Discharge  Instructions    Diet - low sodium heart healthy    Complete by:  As directed    Discharge instructions    Complete by:  As directed    You were admitted for low blood counts (anemia) which is not thought to be due to bleeding. We have discussed your case with a hematologist who recommends outpatient follow up.  - You will be contacted by the [facility] to schedule an appointment after discharge. If you are not contacted, please call [phone].  - Continue taking iron daily - Return to medical care if you notice bleeding or your other symptoms return.   Increase activity slowly    Complete by:  As directed        Medication List    STOP taking these medications   ibuprofen 200 MG tablet Commonly known as:  ADVIL,MOTRIN     TAKE these medications   albuterol 108 (90 Base) MCG/ACT inhaler Commonly known as:  PROVENTIL HFA;VENTOLIN HFA Inhale 1-2 puffs into the lungs every 4 (four) hours as needed for wheezing or shortness of breath.   allopurinol 300 MG tablet Commonly known as:  ZYLOPRIM Take 300 mg by mouth daily.   calcium carbonate 500 MG chewable tablet Commonly known as:  TUMS - dosed in mg elemental calcium Chew 1 tablet by mouth as needed for indigestion or heartburn.   colchicine 0.6 MG tablet Take 0.6 mg by mouth daily as needed (gout).   diphenhydrAMINE 25 MG tablet Commonly known as:  BENADRYL Take 25 mg by mouth every 6 (six) hours as needed for allergies.   ferrous sulfate 325 (65 FE) MG tablet Take 1 tablet (325 mg total)  by mouth 2 (two) times daily with a meal.   FLONASE ALLERGY RELIEF 50 MCG/ACT nasal spray Generic drug:  fluticasone Place 1 spray into both nostrils 2 (two) times daily as needed for allergies.   methocarbamol 750 MG tablet Commonly known as:  ROBAXIN Take 750 mg by mouth 2 (two) times daily as needed for muscle spasms.   Olopatadine HCl 0.2 % Soln Place 1 drop into both eyes daily as needed (for allergies).    pantoprazole 40 MG tablet Commonly known as:  PROTONIX Take 1 tablet (40 mg total) by mouth daily.   traMADol 50 MG tablet Commonly known as:  ULTRAM Take 1 tablet (50 mg total) by mouth every 6 (six) hours as needed. What changed:  reasons to take this       No Known Allergies  Consultations:  Hematology, Dr. Myna Hidalgo consulted by phone 10/13  GI, Dr. Elnoria Howard  Procedures/Studies: The capsule endoscopy was significant for a mild/moderate focal nonbleeding gastritis.  I doubt that this is the source of the bleeding.  Her HGB did drop down again today.  I think it will be prudent to ask Hematology to render an opinion about her anemia.  I will follow up with her in the office upon discharge.  She will need to be checked for H. Pylori off of PPI, but I will pursue this work up was an outpatient.  Subjective: Pt feels well, no dizziness, light-headedness, dyspnea. Walking without difficulty. No bleeding.   Discharge Exam: Vitals:   03/19/16 2146 03/20/16 0603  BP: (!) 100/41 (!) 117/39  Pulse: 74 63  Resp: 18 18  Temp: 98.3 F (36.8 C) 98.5 F (36.9 C)   Vitals:   03/19/16 0522 03/19/16 1440 03/19/16 2146 03/20/16 0603  BP: (!) 98/49 (!) 106/43 (!) 100/41 (!) 117/39  Pulse: (!) 59 (!) 58 74 63  Resp: 18 16 18 18   Temp: 98.1 F (36.7 C) 98.3 F (36.8 C) 98.3 F (36.8 C) 98.5 F (36.9 C)  TempSrc: Oral Oral Oral Oral  SpO2: 97% 100% 92% 94%  Weight:      Height:       General: Pt is alert, awake, not in acute distress Cardiovascular: RRR, S1/S2 +, no rubs, no gallops Respiratory: CTA bilaterally, no wheezing, no rhonchi Abdominal: Soft, NT, ND, bowel sounds + Extremities: no edema, no cyanosis  The results of significant diagnostics from this hospitalization (including imaging, microbiology, ancillary and laboratory) are listed below for reference.    Microbiology: No results found for this or any previous visit (from the past 240 hour(s)).   Labs: BNP (last 3  results) No results for input(s): BNP in the last 8760 hours. Basic Metabolic Panel:  Recent Labs Lab 03/17/16 1512 03/18/16 0443 03/20/16 0653  NA 139 138 140  K 4.1 3.5 3.7  CL 102 102 103  CO2 29 30 31   GLUCOSE 140* 84 92  BUN 12 10 6   CREATININE 0.78 0.58 0.70  CALCIUM 9.1 8.2* 8.2*   Liver Function Tests:  Recent Labs Lab 03/17/16 1512  AST 22  ALT 12*  ALKPHOS 69  BILITOT 0.4  PROT 7.8  ALBUMIN 3.4*   No results for input(s): LIPASE, AMYLASE in the last 168 hours. No results for input(s): AMMONIA in the last 168 hours. CBC:  Recent Labs Lab 03/17/16 1512 03/18/16 0443 03/18/16 1952 03/20/16 0653  WBC 7.7 7.4  --  7.6  HGB 5.6* 6.5* 8.0* 7.8*  HCT 21.6* 23.0* 27.8* 27.5*  MCV 69.0* 72.3*  --  74.9*  PLT 476* 351  --  340   Cardiac Enzymes: No results for input(s): CKTOTAL, CKMB, CKMBINDEX, TROPONINI in the last 168 hours. BNP: Invalid input(s): POCBNP CBG: No results for input(s): GLUCAP in the last 168 hours. D-Dimer No results for input(s): DDIMER in the last 72 hours. Hgb A1c No results for input(s): HGBA1C in the last 72 hours. Lipid Profile No results for input(s): CHOL, HDL, LDLCALC, TRIG, CHOLHDL, LDLDIRECT in the last 72 hours. Thyroid function studies No results for input(s): TSH, T4TOTAL, T3FREE, THYROIDAB in the last 72 hours.  Invalid input(s): FREET3 Anemia work up  Recent Labs  03/17/16 2102 03/18/16 1558  FERRITIN  --  10*  TIBC  --  441  IRON  --  269*  RETICCTPCT 1.2  --    Urinalysis    Component Value Date/Time   COLORURINE YELLOW 08/16/2015 2141   APPEARANCEUR CLOUDY (A) 08/16/2015 2141   LABSPEC 1.019 08/16/2015 2141   PHURINE 7.0 08/16/2015 2141   GLUCOSEU NEGATIVE 08/16/2015 2141   HGBUR NEGATIVE 08/16/2015 2141   BILIRUBINUR NEGATIVE 08/16/2015 2141   KETONESUR NEGATIVE 08/16/2015 2141   PROTEINUR NEGATIVE 08/16/2015 2141   NITRITE NEGATIVE 08/16/2015 2141   LEUKOCYTESUR NEGATIVE 08/16/2015 2141    Sepsis Labs Invalid input(s): PROCALCITONIN,  WBC,  LACTICIDVEN Microbiology No results found for this or any previous visit (from the past 240 hour(s)).  Time coordinating discharge: Over 30 minutes  Hazeline Junker, MD  [facility] 03/20/2016, 2:01 PM Pager [phone]  If 7PM-7AM, please contact night-coverage [email or web address] Password [email or web address]

## 2016-03-20 NOTE — Progress Notes (Signed)
Capsule study equipment removed at 2020 and kept at pt room for pickup at 1020 in the moring

## 2016-03-20 NOTE — Care Management Note (Addendum)
Case Management Note  Patient Details  Name: Emily Holland MRN: [id number] Date of Birth: 12/08/1960  Subjective/Objective:        Presents with symptomatic anemia. From home with husband. States independent with ADL's. Uses a cane with ambulation.  PCP: Fleet ContrasEdwin Avbuere          Action/Plan: Plan is to d/c to home today. Pt will pick up elevated commode chair from [facility], [address] [phone]. Pt made aware by CM.  Expected Discharge Date:    03/20/2016           Expected Discharge Plan:  Home/Self Care  In-House Referral:     Discharge planning Services  CM Consult   Status of Service:  Completed, signed off  If discussed at Long Length of Stay Meetings, dates discussed:    Additional Comments:  Epifanio LeschesCole, Rejoice Heatwole Hudson, RN 03/20/2016, 2:31 PM

## 2016-03-20 NOTE — Progress Notes (Signed)
Emily GarreEvelyne Holland Kellis to be D/C'd Home per MD order.  Discussed with the patient and all questions fully answered.  VSS, Skin clean, dry and intact without evidence of skin break down, no evidence of skin tears noted. IV catheter discontinued intact. Site without signs and symptoms of complications. Dressing and pressure applied.  An After Visit Summary was printed and given to the patient. Patient received prescription.  D/c education completed with patient/family including follow up instructions, medication list, d/c activities limitations if indicated, with other d/c instructions as indicated by MD - patient able to verbalize understanding, all questions fully answered.   Patient instructed to return to ED, call 911, or call MD for any changes in condition.   Patient to be escorted via WC, and D/C home via private auto.  Patient told to follow up with Dr. Elnoria HowardHung and contact information provided.   L'ESPERANCE, [name] 03/20/2016 3:10 PM

## 2016-03-20 NOTE — Progress Notes (Signed)
Subjective: No acute events.  Objective: Vital signs in last 24 hours: Temp:  [98.3 F (36.8 C)-98.5 F (36.9 C)] 98.5 F (36.9 C) (10/13 0603) Pulse Rate:  [58-74] 63 (10/13 0603) Resp:  [16-18] 18 (10/13 0603) BP: (100-117)/(39-43) 117/39 (10/13 0603) SpO2:  [92 %-100 %] 94 % (10/13 0603) Last BM Date: 03/19/16  Intake/Output from previous day: 10/12 0701 - 10/13 0700 In: 240 [P.O.:240] Out: 600 [Urine:600] Intake/Output this shift: Total I/O In: -  Out: 950 [Urine:950]  General appearance: alert and no distress GI: soft, non-tender; bowel sounds normal; no masses,  no organomegaly  Lab Results:  Recent Labs  03/17/16 1512 03/18/16 0443 03/18/16 1952 03/20/16 0653  WBC 7.7 7.4  --  7.6  HGB 5.6* 6.5* 8.0* 7.8*  HCT 21.6* 23.0* 27.8* 27.5*  PLT 476* 351  --  340   BMET  Recent Labs  03/17/16 1512 03/18/16 0443 03/20/16 0653  NA 139 138 140  K 4.1 3.5 3.7  CL 102 102 103  CO2 29 30 31   GLUCOSE 140* 84 92  BUN 12 10 6   CREATININE 0.78 0.58 0.70  CALCIUM 9.1 8.2* 8.2*   LFT  Recent Labs  03/17/16 1512  PROT 7.8  ALBUMIN 3.4*  AST 22  ALT 12*  ALKPHOS 69  BILITOT 0.4   PT/INR No results for input(s): LABPROT, INR in the last 72 hours. Hepatitis Panel No results for input(s): HEPBSAG, HCVAB, HEPAIGM, HEPBIGM in the last 72 hours. C-Diff No results for input(s): CDIFFTOX in the last 72 hours. Fecal Lactopherrin No results for input(s): FECLLACTOFRN in the last 72 hours.  Studies/Results: No results found.  Medications:  Scheduled: . allopurinol  300 mg Oral Daily  . ferrous sulfate  325 mg Oral BID WC  . multivitamin with minerals  1 tablet Oral Daily  . pantoprazole  40 mg Oral Daily  . polyethylene glycol  17 g Oral Daily  . senna-docusate  1 tablet Oral Daily   Continuous:   Assessment/Plan: 1) Recurrent and severe IDA.   The capsule endoscopy was significant for a mild/moderate focal nonbleeding gastritis.  I doubt that this  is the source of the bleeding.  Her HGB did drop down again today.  I think it will be prudent to ask Hematology to render an opinion about her anemia.  I will follow up with her in the office upon discharge.  She will need to be checked for H. Pylori off of PPI, but I will pursue this work up was an outpatient.  LOS: 0 days   Emily Holland D 03/20/2016, 11:53 AM

## 2016-03-23 ENCOUNTER — Telehealth: Payer: Self-pay | Admitting: Hematology

## 2016-03-23 ENCOUNTER — Encounter: Payer: Self-pay | Admitting: Hematology

## 2016-03-23 NOTE — Telephone Encounter (Signed)
Received referral from Dr. Jarvis NewcomerGrunz. Pt was seen in the hospital on 10/13 and [facility] [place] [facility] [place]. Telephone call made to the pt to schedule an appt. Appt scheduled w/Kale for 11/3 per pt request. She voiced understanding. Demographics verified. Letter mailed to the patient.

## 2016-03-30 DIAGNOSIS — D509 Iron deficiency anemia, unspecified: Secondary | ICD-10-CM | POA: Diagnosis not present

## 2016-03-30 DIAGNOSIS — K2970 Gastritis, unspecified, without bleeding: Secondary | ICD-10-CM | POA: Diagnosis not present

## 2016-04-10 ENCOUNTER — Encounter: Payer: Self-pay | Admitting: Hematology

## 2016-04-10 ENCOUNTER — Ambulatory Visit (HOSPITAL_BASED_OUTPATIENT_CLINIC_OR_DEPARTMENT_OTHER): Payer: 59

## 2016-04-10 ENCOUNTER — Telehealth: Payer: Self-pay | Admitting: Hematology

## 2016-04-10 ENCOUNTER — Telehealth: Payer: Self-pay | Admitting: *Deleted

## 2016-04-10 ENCOUNTER — Ambulatory Visit (HOSPITAL_BASED_OUTPATIENT_CLINIC_OR_DEPARTMENT_OTHER): Payer: 59 | Admitting: Hematology

## 2016-04-10 VITALS — BP 137/68 | HR 77 | Temp 98.7°F | Resp 16 | Ht 66.0 in | Wt 274.6 lb

## 2016-04-10 DIAGNOSIS — D500 Iron deficiency anemia secondary to blood loss (chronic): Secondary | ICD-10-CM | POA: Diagnosis not present

## 2016-04-10 DIAGNOSIS — D509 Iron deficiency anemia, unspecified: Secondary | ICD-10-CM

## 2016-04-10 DIAGNOSIS — K2900 Acute gastritis without bleeding: Secondary | ICD-10-CM

## 2016-04-10 DIAGNOSIS — K449 Diaphragmatic hernia without obstruction or gangrene: Secondary | ICD-10-CM | POA: Diagnosis not present

## 2016-04-10 LAB — COMPREHENSIVE METABOLIC PANEL
ALT: 8 U/L (ref 0–55)
AST: 16 U/L (ref 5–34)
Albumin: 3.4 g/dL — ABNORMAL LOW (ref 3.5–5.0)
Alkaline Phosphatase: 81 U/L (ref 40–150)
Anion Gap: 8 mEq/L (ref 3–11)
BUN: 10.5 mg/dL (ref 7.0–26.0)
CO2: 31 mEq/L — ABNORMAL HIGH (ref 22–29)
Calcium: 9.4 mg/dL (ref 8.4–10.4)
Chloride: 103 mEq/L (ref 98–109)
Creatinine: 0.7 mg/dL (ref 0.6–1.1)
EGFR: >90 mL/min/{1.73_m2} (ref >90)
Glucose: 85 mg/dl (ref 70–140)
Potassium: 3.8 mEq/L (ref 3.5–5.1)
Sodium: 142 mEq/L (ref 136–145)
Total Bilirubin: 0.37 mg/dL (ref 0.20–1.20)
Total Protein: 7.8 g/dL (ref 6.4–8.3)

## 2016-04-10 LAB — CBC & DIFF AND RETIC
BASO%: 0.6 % (ref 0.0–2.0)
Basophils Absolute: 0 10*3/uL (ref 0.0–0.1)
EOS%: 1.3 % (ref 0.0–7.0)
Eosinophils Absolute: 0.1 10*3/uL (ref 0.0–0.5)
HCT: 32.3 % — ABNORMAL LOW (ref 34.8–46.6)
HGB: 9.8 g/dL — ABNORMAL LOW (ref 11.6–15.9)
Immature Retic Fract: 14.1 % — ABNORMAL HIGH (ref 1.60–10.00)
LYMPH%: 29.9 % (ref 14.0–49.7)
MCH: 24 pg — ABNORMAL LOW (ref 25.1–34.0)
MCHC: 30.2 g/dL — ABNORMAL LOW (ref 31.5–36.0)
MCV: 79.3 fL — ABNORMAL LOW (ref 79.5–101.0)
MONO#: 0.6 10*3/uL (ref 0.1–0.9)
MONO%: 9 % (ref 0.0–14.0)
NEUT#: 4.1 10*3/uL (ref 1.5–6.5)
NEUT%: 59.2 % (ref 38.4–76.8)
Platelets: 434 10*3/uL — ABNORMAL HIGH (ref 145–400)
RBC: 4.08 10*6/uL (ref 3.70–5.45)
RDW: 31 % — ABNORMAL HIGH (ref 11.2–14.5)
Retic %: 2.01 % (ref 0.70–2.10)
Retic Ct Abs: 82.01 10*3/uL (ref 33.70–90.70)
WBC: 7 10*3/uL (ref 3.9–10.3)
lymph#: 2.1 10*3/uL (ref 0.9–3.3)
nRBC: 0 % (ref 0–0)

## 2016-04-10 LAB — IRON AND TIBC
%SAT: 6 % — ABNORMAL LOW (ref 21–57)
Iron: 22 ug/dL — ABNORMAL LOW (ref 41–142)
TIBC: 383 ug/dL (ref 236–444)
UIBC: 360 ug/dL (ref 120–384)

## 2016-04-10 LAB — FERRITIN: Ferritin: 20 ng/ml (ref 9–269)

## 2016-04-10 LAB — LACTATE DEHYDROGENASE: LDH: 203 U/L (ref 125–245)

## 2016-04-10 NOTE — Progress Notes (Signed)
[name] Kitchen    HEMATOLOGY/ONCOLOGY CONSULTATION NOTE  Date of Service: 04/10/2016  Patient Care Team: Fleet Contras, MD as PCP - General (Internal Medicine)  CHIEF COMPLAINTS/PURPOSE OF CONSULTATION:  Anemia  HISTORY OF PRESENTING ILLNESS:   Emily Holland is a wonderful 55 y.o. female who has been referred to [place] by Dr [name], MD for evaluation and management of anemia.  Patient is originally from [place] and has a history of gout, anemia with history of blood transfusion in 06/2015, 08/2015 and 03/17/2016, arthritis, GERD, obesity. She has been on ferrous sulfate 1 tablet by mouth twice a day since 2016 for treatment of her anemia. She was recently admitted to the hospital on 03/17/2016 with symptomatic anemia with a hemoglobin of 5.6 and received 2 units of PRBCs. She has previously had a complete GI workup in January 2017 with capsule endoscopy on 06/20/2015 showing no source for the severe anemia. Small bowel is within normal limits with no inflammation, ulcerations, erosions, polyps masses or vascular abnormalities noted. Colonoscopy done in January 2017 showed a normal colon with no evidence of bleeding. EGD with small bowel enteroscopy showed a 5 cm hiatal hernia and a small duodenal erosion but no other overt lesions.  Repeat capsule endoscopy on 03/18/2016 showed some mild erosive gastritis but no evidence of overt bleeding. Patient's hemoglobin after transfusion in October was 7.8 on 03/20/2016 with an MCV of 74.9 and elevated RDW.  She was sent to [place] for further evaluation and management of her microcytic anemia. Labs today show a hemoglobin of 9.8 with an MCV of 79 , normal W BC count and elevated platelets of 434k. Ferritin level has fluctuated from the 10-20 range of the last 2 months . Patient notes no overt GI bleeding .  No abdominal pain.   uses PPI as needed for acid reflux. No overt hematemesis hematochezia or melena. No other evidence of overt bleeding. Was  using a lot of modified within 2015 2016 related to arthritis pain.   MEDICAL HISTORY:  Past Medical History:  Diagnosis Date  . Arthritis    "bones; joints; legs; ankles" (03/17/2016)  . GERD (gastroesophageal reflux disease)   . Gout   . History of blood transfusion 06/2015; 08/2015; 03/17/2016  . History of iron deficiency anemia    took iron supplements 06/2015-3/ 2017; stopped/dr's order (03/17/2016)  . Low hemoglobin 06/2015; 08/2015; 03/17/2016  . Shortness of breath on exertion 06/03/2011  . Symptomatic anemia 06/2015; 08/2015; 03/17/2016    SURGICAL HISTORY: Past Surgical History:  Procedure Laterality Date  . CERVICAL CONIZATION W/BX  08/2004  . COLONOSCOPY N/A 06/13/2015   Procedure: COLONOSCOPY;  Surgeon: Jeani Hawking, MD;  Location: [facility] ENDOSCOPY;  Service: Endoscopy;  Laterality: N/A;  . ESOPHAGOGASTRODUODENOSCOPY N/A 06/13/2015   Procedure: ESOPHAGOGASTRODUODENOSCOPY (EGD)/Colonoscopy;  Surgeon: Jeani Hawking, MD;  Location: [facility] ENDOSCOPY;  Service: Endoscopy;  Laterality: N/A;  . GIVENS CAPSULE STUDY N/A 06/14/2015   Procedure: GIVENS CAPSULE STUDY;  Surgeon: Jeani Hawking, MD;  Location: [facility] ENDOSCOPY;  Service: Endoscopy;  Laterality: N/A;  . GIVENS CAPSULE STUDY N/A 03/19/2016   Procedure: GIVENS CAPSULE STUDY;  Surgeon: Jeani Hawking, MD;  Location: [facility] ENDOSCOPY;  Service: Endoscopy;  Laterality: N/A;  . VAGINAL HYSTERECTOMY  ~ 2005    SOCIAL HISTORY: Social History   Social History  . Marital status: Married    Spouse name: N/A  . Number of children: N/A  . Years of education: N/A   Occupational History  . Not on file.  Social History Main Topics  . Smoking status: Never Smoker  . Smokeless tobacco: Never Used  . Alcohol use No     Comment: Occassional  . Drug use: No  . Sexual activity: Yes   Other Topics Concern  . Not on file   Social History Narrative   ** Merged History Encounter **        FAMILY HISTORY: History reviewed. No pertinent family  history.  ALLERGIES:  has No Known Allergies.  MEDICATIONS:  Current Outpatient Prescriptions  Medication Sig Dispense Refill  . allopurinol (ZYLOPRIM) 300 MG tablet Take 300 mg by mouth daily.   5  . calcium carbonate (TUMS - DOSED IN MG ELEMENTAL CALCIUM) 500 MG chewable tablet Chew 1 tablet by mouth as needed for indigestion or heartburn.    . colchicine 0.6 MG tablet Take 0.6 mg by mouth daily as needed (gout).     [name] Kitchen. diphenhydrAMINE (BENADRYL) 25 MG tablet Take 25 mg by mouth every 6 (six) hours as needed for allergies.     . ferrous sulfate 325 (65 FE) MG tablet Take 1 tablet (325 mg total) by mouth 2 (two) times daily with a meal. 60 tablet 3  . ferrous sulfate 325 (65 FE) MG tablet Take 1 tablet (325 mg total) by mouth 2 (two) times daily with a meal. 60 tablet 0  . fluticasone (FLONASE ALLERGY RELIEF) 50 MCG/ACT nasal spray Place 1 spray into both nostrils 2 (two) times daily as needed for allergies.    . methocarbamol (ROBAXIN) 750 MG tablet Take 750 mg by mouth 2 (two) times daily as needed for muscle spasms.   5  . Olopatadine HCl 0.2 % SOLN Place 1 drop into both eyes daily as needed (for allergies).    . pantoprazole (PROTONIX) 40 MG tablet Take 1 tablet (40 mg total) by mouth daily. 60 tablet 1  . traMADol (ULTRAM) 50 MG tablet Take 1 tablet (50 mg total) by mouth every 6 (six) hours as needed. (Patient taking differently: Take 50 mg by mouth every 6 (six) hours as needed (for pain). ) 20 tablet 0  . albuterol (PROVENTIL HFA;VENTOLIN HFA) 108 (90 BASE) MCG/ACT inhaler Inhale 1-2 puffs into the lungs every 4 (four) hours as needed for wheezing or shortness of breath. 1 Inhaler 0   No current facility-administered medications for this visit.     REVIEW OF SYSTEMS:    10 Point review of Systems was done is negative except as noted above.  PHYSICAL EXAMINATION: ECOG PERFORMANCE STATUS: 2 - Symptomatic, <50% confined to bed  . Vitals:   04/10/16 1156  BP: 137/68  Pulse: 77    Resp: 16  Temp: 98.7 F (37.1 C)   Filed Weights   04/10/16 1156  Weight: 274 lb 9.6 oz (124.6 kg)   .Body mass index is 44.32 kg/m.  GENERAL:alert, in no acute distress and comfortable SKIN: skin color, texture, turgor are normal, no rashes or significant lesions EYES: normal, conjunctiva are pink and non-injected, sclera clear OROPHARYNX:no exudate, no erythema and lips, buccal mucosa, and tongue normal  NECK: supple, no JVD, thyroid normal size, non-tender, without nodularity LYMPH:  no palpable lymphadenopathy in the cervical, axillary or inguinal LUNGS: clear to auscultation with normal respiratory effort HEART: regular rate & rhythm,  no murmurs and no lower extremity edema ABDOMEN: abdomen soft, non-tender, normoactive bowel sounds  Musculoskeletal: no cyanosis of digits and no clubbing  PSYCH: alert & oriented x 3 with fluent speech NEURO: no focal motor/sensory  deficits  LABORATORY DATA:  I have reviewed the data as listed  . CBC Latest Ref Rng & Units 04/10/2016 03/20/2016 03/18/2016  WBC 3.9 - 10.3 10e3/uL 7.0 7.6 -  Hemoglobin 11.6 - 15.9 g/dL 4.6(N9.8(L) 7.8(L) 8.0(L)  Hematocrit 34.8 - 46.6 % 32.3(L) 27.5(L) 27.8(L)  Platelets 145 - 400 10e3/uL 434(H) 340 -   . CBC    Component Value Date/Time   WBC 7.0 04/10/2016 1347   WBC 7.6 03/20/2016 0653   RBC 4.08 04/10/2016 1347   RBC 3.67 (L) 03/20/2016 0653   HGB 9.8 (L) 04/10/2016 1347   HCT 32.3 (L) 04/10/2016 1347   PLT 434 (H) 04/10/2016 1347   MCV 79.3 (L) 04/10/2016 1347   MCH 24.0 (L) 04/10/2016 1347   MCH 21.3 (L) 03/20/2016 0653   MCHC 30.2 (L) 04/10/2016 1347   MCHC 28.4 (L) 03/20/2016 0653   RDW 31.0 (H) 04/10/2016 1347   LYMPHSABS 2.1 04/10/2016 1347   MONOABS 0.6 04/10/2016 1347   EOSABS 0.1 04/10/2016 1347   BASOSABS 0.0 04/10/2016 1347    . CMP Latest Ref Rng & Units 04/10/2016 03/20/2016 03/18/2016  Glucose 70 - 140 mg/dl 85 92 84  BUN 7.0 - 62.926.0 mg/dL 52.$WUXLKGMWNUUVOZDG_UYQIHKVQQVZDGLOVFIEPPIRJJOACZYSA$$YTKZSWFUXNATFTDD_UKGURKYHCWCBJSEGBTDVVOHYWVPXTGGY$10.5 6 10   Creatinine 0.6 - 1.1  mg/dL 0.7 6.940.70 8.540.58  Sodium 627136 - 145 mEq/L 142 140 138  Potassium 3.5 - 5.1 mEq/L 3.8 3.7 3.5  Chloride 101 - 111 mmol/L - 103 102  CO2 22 - 29 mEq/L 31(H) 31 30  Calcium 8.4 - 10.4 mg/dL 9.4 0.3(J8.2(L) 0.0(X8.2(L)  Total Protein 6.4 - 8.3 g/dL 7.8 - -  Total Bilirubin 0.20 - 1.20 mg/dL 3.810.37 - -  Alkaline Phos 40 - 150 U/L 81 - -  AST 5 - 34 U/L 16 - -  ALT 0 - 55 U/L 8 - -   Component     Latest Ref Rng & Units 04/10/2016  HEMOGLOBIN F QUANTITATION     0.0 - 2.0 % 0.0  Hgb A     94.0 - 98.0 % 98.2 (H)  HGB S     0.0 % 0.0  HGB C     0.0 % 0.0  HEMOGLOBIN A2 QUANTITATION     0.7 - 3.1 % 1.8  HGB INTERPRETATION      Comment  Iron     41 - 142 ug/dL 22 (L)  TIBC     829236 - 444 ug/dL 937383  UIBC     169120 - 678384 ug/dL 938360  %SAT     21 - 57 % 6 (L)  Ferritin     9 - 269 ng/ml 20  LDH     125 - 245 U/L 203  Haptoglobin     34 - 200 mg/dL 101162  Sed Rate     0 - 40 mm/hr 41 (H)  Vitamin B12     211 - 946 pg/mL 918     RADIOGRAPHIC STUDIES: I have personally reviewed the radiological images as listed and agreed with the findings in the report. No results found.  ASSESSMENT & PLAN:   55 year old female from [place] with   1) Persistent Severe Microcytic Anemia due to Iron deficiency. She has required hospitalization with PRBC transfusions in January, March and October 2017. Her iron deficiency appears to be likely related to GI bleeding with unclear primary source  despite extensive GI workup as noted above. Her iron profile suggests persistent Iron deficiency anemia despite PO Iron use for > 1 yr.  hemoglobin electrophoresis within normal limits to rule out overt hemoglobinopathy. No evidence of hemolysis at this time.  2) Hiatal hernia with mild erosive gastritis PLAN -Patient is aware of all the lab findings and her persistent iron deficiency. -She would like to avoid additional PRBC transfusions if possible. Since her oral iron hasn't helped enough we offered her IV  feraheme every weekly 3 doses to correct her iron deficiency anemia and build her iron stores. Given from consent after discussion of the pros and cons and alternatives to IV Iron -Given high risk for ongoing GI losses we will try to keep her ferritin level more than 100. -Patient was counseled that she could stop her oral iron replacement at this time . -Continue B complex vitamin to support accelerated hematopoiesis. -Avoid NSAIDs or other medications that could increase bleeding risk . -Counseled on appropriate use of iron rich foods.   Return to care with Dr. Candise Che in 6-8 weeks after IV iron with repeat labs .    All of the patients questions were answered with apparent satisfaction. The patient knows to call the clinic with any problems, questions or concerns.  I spent 45 minutes counseling the patient face to face. The total time spent in the appointment was 60 minutes and more than 50% was on counseling and direct patient cares.    Wyvonnia Lora MD MS AAHIVMS [facility] Hematology/Oncology Physician [facility]  (Office):       [phone] (Work cell):  [phone] (Fax):           [phone]  04/10/2016 12:10 PM

## 2016-04-10 NOTE — Telephone Encounter (Signed)
Per LOS I have scheduled appts and notified the scheduler 

## 2016-04-10 NOTE — Telephone Encounter (Signed)
04/22/16 dr Candise CheKale is on PAL. Scheduled for next available, which is 04/21/16. Appointments scheduled per 04/10/16 los. AVS report and appointment schedule given to patient per 04/10/16 los.

## 2016-04-11 LAB — VITAMIN B12: Vitamin B12: 918 pg/mL (ref 211–946)

## 2016-04-11 LAB — SEDIMENTATION RATE: Sedimentation Rate-Westergren: 41 mm/hr — ABNORMAL HIGH (ref 0–40)

## 2016-04-11 LAB — HAPTOGLOBIN: Haptoglobin: 162 mg/dL (ref 34–200)

## 2016-04-13 LAB — HEMOGLOBINOPATHY EVALUATION
HGB C: 0 %
HGB S: 0 %
Hemoglobin A2 Quantitation: 1.8 % (ref 0.7–3.1)
Hemoglobin F Quantitation: 0 % (ref 0.0–2.0)
Hgb A: 98.2 % — ABNORMAL HIGH (ref 94.0–98.0)

## 2016-04-14 ENCOUNTER — Ambulatory Visit: Payer: 59

## 2016-04-21 ENCOUNTER — Other Ambulatory Visit: Payer: Self-pay | Admitting: *Deleted

## 2016-04-21 ENCOUNTER — Ambulatory Visit: Payer: 59

## 2016-04-22 ENCOUNTER — Other Ambulatory Visit: Payer: Self-pay | Admitting: Hematology

## 2016-04-22 DIAGNOSIS — D500 Iron deficiency anemia secondary to blood loss (chronic): Secondary | ICD-10-CM | POA: Insufficient documentation

## 2016-04-28 ENCOUNTER — Ambulatory Visit: Payer: 59

## 2016-05-21 ENCOUNTER — Ambulatory Visit (HOSPITAL_BASED_OUTPATIENT_CLINIC_OR_DEPARTMENT_OTHER): Payer: 59 | Admitting: Hematology

## 2016-05-21 ENCOUNTER — Telehealth: Payer: Self-pay | Admitting: Hematology

## 2016-05-21 ENCOUNTER — Other Ambulatory Visit (HOSPITAL_BASED_OUTPATIENT_CLINIC_OR_DEPARTMENT_OTHER): Payer: 59

## 2016-05-21 ENCOUNTER — Encounter: Payer: Self-pay | Admitting: Hematology

## 2016-05-21 VITALS — BP 133/82 | HR 72 | Temp 98.6°F | Resp 18 | Ht 66.0 in | Wt 272.5 lb

## 2016-05-21 DIAGNOSIS — D500 Iron deficiency anemia secondary to blood loss (chronic): Secondary | ICD-10-CM

## 2016-05-21 DIAGNOSIS — D509 Iron deficiency anemia, unspecified: Secondary | ICD-10-CM | POA: Diagnosis not present

## 2016-05-21 LAB — CBC & DIFF AND RETIC
BASO%: 0.5 % (ref 0.0–2.0)
Basophils Absolute: 0 10*3/uL (ref 0.0–0.1)
EOS%: 1.6 % (ref 0.0–7.0)
Eosinophils Absolute: 0.1 10*3/uL (ref 0.0–0.5)
HCT: 34.1 % — ABNORMAL LOW (ref 34.8–46.6)
HGB: 10 g/dL — ABNORMAL LOW (ref 11.6–15.9)
Immature Retic Fract: 12 % — ABNORMAL HIGH (ref 1.60–10.00)
LYMPH%: 35.7 % (ref 14.0–49.7)
MCH: 24.4 pg — ABNORMAL LOW (ref 25.1–34.0)
MCHC: 29.3 g/dL — ABNORMAL LOW (ref 31.5–36.0)
MCV: 83.4 fL (ref 79.5–101.0)
MONO#: 0.6 10*3/uL (ref 0.1–0.9)
MONO%: 8.5 % (ref 0.0–14.0)
NEUT#: 3.5 10*3/uL (ref 1.5–6.5)
NEUT%: 53.7 % (ref 38.4–76.8)
Platelets: 322 10*3/uL (ref 145–400)
RBC: 4.09 10*6/uL (ref 3.70–5.45)
RDW: 20.2 % — ABNORMAL HIGH (ref 11.2–14.5)
Retic %: 1.09 % (ref 0.70–2.10)
Retic Ct Abs: 44.58 10*3/uL (ref 33.70–90.70)
WBC: 6.4 10*3/uL (ref 3.9–10.3)
lymph#: 2.3 10*3/uL (ref 0.9–3.3)

## 2016-05-21 LAB — COMPREHENSIVE METABOLIC PANEL
ALT: 13 U/L (ref 0–55)
AST: 17 U/L (ref 5–34)
Albumin: 3.3 g/dL — ABNORMAL LOW (ref 3.5–5.0)
Alkaline Phosphatase: 100 U/L (ref 40–150)
Anion Gap: 9 mEq/L (ref 3–11)
BUN: 15.3 mg/dL (ref 7.0–26.0)
CO2: 29 mEq/L (ref 22–29)
Calcium: 9 mg/dL (ref 8.4–10.4)
Chloride: 102 mEq/L (ref 98–109)
Creatinine: 0.7 mg/dL (ref 0.6–1.1)
EGFR: >90 mL/min/{1.73_m2} (ref >90)
Glucose: 146 mg/dl — ABNORMAL HIGH (ref 70–140)
Potassium: 3.7 mEq/L (ref 3.5–5.1)
Sodium: 140 mEq/L (ref 136–145)
Total Bilirubin: 0.28 mg/dL (ref 0.20–1.20)
Total Protein: 8.1 g/dL (ref 6.4–8.3)

## 2016-05-21 LAB — IRON AND TIBC
%SAT: 6 % — ABNORMAL LOW (ref 21–57)
Iron: 26 ug/dL — ABNORMAL LOW (ref 41–142)
TIBC: 409 ug/dL (ref 236–444)
UIBC: 383 ug/dL (ref 120–384)

## 2016-05-21 LAB — FERRITIN: Ferritin: 9 ng/ml (ref 9–269)

## 2016-05-21 NOTE — Telephone Encounter (Signed)
Appointments scheduled per 12/14 LOS. Patient given AVS report and calendars with future scheduled appointments.  °

## 2016-05-22 ENCOUNTER — Telehealth: Payer: Self-pay | Admitting: *Deleted

## 2016-05-22 MED FILL — OLOPATADINE HCL 0.2% EYE DR: 0.2 | 30 days supply | Qty: 3 | Fill #1

## 2016-05-22 MED FILL — ALLOPURINOL 300 MG TABLET: 300 | 30 days supply | Qty: 30 | Fill #1

## 2016-05-22 NOTE — Telephone Encounter (Signed)
Per LOS I have scheduled appts and notified the scheduler 

## 2016-05-26 MED FILL — traMADol HCL 50 MG TABS: 50 | 30 days supply | Qty: 60 | Fill #0

## 2016-05-28 ENCOUNTER — Ambulatory Visit (HOSPITAL_BASED_OUTPATIENT_CLINIC_OR_DEPARTMENT_OTHER): Payer: 59

## 2016-05-28 VITALS — BP 114/56 | HR 64 | Temp 98.6°F | Resp 16

## 2016-05-28 DIAGNOSIS — D500 Iron deficiency anemia secondary to blood loss (chronic): Secondary | ICD-10-CM | POA: Diagnosis not present

## 2016-05-28 MED ORDER — SODIUM CHLORIDE 0.9 % IV SOLN
Freq: Once | INTRAVENOUS | Status: AC
  Administered 2016-05-28: 09:00:00 via INTRAVENOUS

## 2016-05-28 MED ORDER — SODIUM CHLORIDE 0.9 % IV SOLN
510.0000 mg | Freq: Once | INTRAVENOUS | Status: AC
  Administered 2016-05-28: 510 mg via INTRAVENOUS
  Filled 2016-05-28: qty 17

## 2016-05-28 NOTE — Patient Instructions (Signed)
Ferumoxytol injection What is this medicine? FERUMOXYTOL is an iron complex. Iron is used to make healthy red blood cells, which carry oxygen and nutrients throughout the body. This medicine is used to treat iron deficiency anemia in people with chronic kidney disease. COMMON BRAND NAME(S): Feraheme What should I tell my health care provider before I take this medicine? They need to know if you have any of these conditions: -anemia not caused by low iron levels -high levels of iron in the blood -magnetic resonance imaging (MRI) test scheduled -an unusual or allergic reaction to iron, other medicines, foods, dyes, or preservatives -pregnant or trying to get pregnant -breast-feeding How should I use this medicine? This medicine is for injection into a vein. It is given by a health care professional in a hospital or clinic setting. Talk to your pediatrician regarding the use of this medicine in children. Special care may be needed. What if I miss a dose? It is important not to miss your dose. Call your doctor or health care professional if you are unable to keep an appointment. What may interact with this medicine? This medicine may interact with the following medications: -other iron products What should I watch for while using this medicine? Visit your doctor or healthcare professional regularly. Tell your doctor or healthcare professional if your symptoms do not start to get better or if they get worse. You may need blood work done while you are taking this medicine. You may need to follow a special diet. Talk to your doctor. Foods that contain iron include: whole grains/cereals, dried fruits, beans, or peas, leafy green vegetables, and organ meats (liver, kidney). What side effects may I notice from receiving this medicine? Side effects that you should report to your doctor or health care professional as soon as possible: -allergic reactions like skin rash, itching or hives, swelling of the  face, lips, or tongue -breathing problems -changes in blood pressure -feeling faint or lightheaded, falls -fever or chills -flushing, sweating, or hot feelings -swelling of the ankles or feet Side effects that usually do not require medical attention (report to your doctor or health care professional if they continue or are bothersome): -diarrhea -headache -nausea, vomiting -stomach pain Where should I keep my medicine? This drug is given in a hospital or clinic and will not be stored at home.  2017 Elsevier/Gold Standard (2015-06-27 12:41:49)  

## 2016-06-04 ENCOUNTER — Ambulatory Visit (HOSPITAL_BASED_OUTPATIENT_CLINIC_OR_DEPARTMENT_OTHER): Payer: 59

## 2016-06-04 VITALS — BP 124/69 | HR 65 | Temp 98.1°F | Resp 18

## 2016-06-04 DIAGNOSIS — D500 Iron deficiency anemia secondary to blood loss (chronic): Secondary | ICD-10-CM | POA: Diagnosis not present

## 2016-06-04 MED ORDER — SODIUM CHLORIDE 0.9 % IV SOLN
510.0000 mg | Freq: Once | INTRAVENOUS | Status: AC
  Administered 2016-06-04: 510 mg via INTRAVENOUS
  Filled 2016-06-04: qty 17

## 2016-06-04 MED ORDER — SODIUM CHLORIDE 0.9 % IV SOLN
Freq: Once | INTRAVENOUS | Status: AC
  Administered 2016-06-04: 15:00:00 via INTRAVENOUS

## 2016-06-04 NOTE — Patient Instructions (Signed)
Ferumoxytol injection What is this medicine? FERUMOXYTOL is an iron complex. Iron is used to make healthy red blood cells, which carry oxygen and nutrients throughout the body. This medicine is used to treat iron deficiency anemia in people with chronic kidney disease. COMMON BRAND NAME(S): Feraheme What should I tell my health care provider before I take this medicine? They need to know if you have any of these conditions: -anemia not caused by low iron levels -high levels of iron in the blood -magnetic resonance imaging (MRI) test scheduled -an unusual or allergic reaction to iron, other medicines, foods, dyes, or preservatives -pregnant or trying to get pregnant -breast-feeding How should I use this medicine? This medicine is for injection into a vein. It is given by a health care professional in a hospital or clinic setting. Talk to your pediatrician regarding the use of this medicine in children. Special care may be needed. What if I miss a dose? It is important not to miss your dose. Call your doctor or health care professional if you are unable to keep an appointment. What may interact with this medicine? This medicine may interact with the following medications: -other iron products What should I watch for while using this medicine? Visit your doctor or healthcare professional regularly. Tell your doctor or healthcare professional if your symptoms do not start to get better or if they get worse. You may need blood work done while you are taking this medicine. You may need to follow a special diet. Talk to your doctor. Foods that contain iron include: whole grains/cereals, dried fruits, beans, or peas, leafy green vegetables, and organ meats (liver, kidney). What side effects may I notice from receiving this medicine? Side effects that you should report to your doctor or health care professional as soon as possible: -allergic reactions like skin rash, itching or hives, swelling of the  face, lips, or tongue -breathing problems -changes in blood pressure -feeling faint or lightheaded, falls -fever or chills -flushing, sweating, or hot feelings -swelling of the ankles or feet Side effects that usually do not require medical attention (report to your doctor or health care professional if they continue or are bothersome): -diarrhea -headache -nausea, vomiting -stomach pain Where should I keep my medicine? This drug is given in a hospital or clinic and will not be stored at home.  2017 Elsevier/Gold Standard (2015-06-27 12:41:49)  

## 2016-06-05 NOTE — Progress Notes (Signed)
[name] Kitchen    HEMATOLOGY/ONCOLOGY CLINIC NOTE  Date of Service: .05/21/2016  Patient Care Team: Fleet Contras, MD as PCP - General (Internal Medicine)  CHIEF COMPLAINTS/PURPOSE OF CONSULTATION:  Anemia  HISTORY OF PRESENTING ILLNESS:   Emily Holland is a wonderful 55 y.o. female who has been referred to [place] by Dr [name], MD for evaluation and management of anemia.  Patient is originally from [place] and has a history of gout, anemia with history of blood transfusion in 06/2015, 08/2015 and 03/17/2016, arthritis, GERD, obesity. She has been on ferrous sulfate 1 tablet by mouth twice a day since 2016 for treatment of her anemia. She was recently admitted to the hospital on 03/17/2016 with symptomatic anemia with a hemoglobin of 5.6 and received 2 units of PRBCs. She has previously had a complete GI workup in January 2017 with capsule endoscopy on 06/20/2015 showing no source for the severe anemia. Small bowel is within normal limits with no inflammation, ulcerations, erosions, polyps masses or vascular abnormalities noted. Colonoscopy done in January 2017 showed a normal colon with no evidence of bleeding. EGD with small bowel enteroscopy showed a 5 cm hiatal hernia and a small duodenal erosion but no other overt lesions.  Repeat capsule endoscopy on 03/18/2016 showed some mild erosive gastritis but no evidence of overt bleeding. Patient's hemoglobin after transfusion in October was 7.8 on 03/20/2016 with an MCV of 74.9 and elevated RDW.  She was sent to [place] for further evaluation and management of her microcytic anemia. Labs today show a hemoglobin of 9.8 with an MCV of 79 , normal W BC count and elevated platelets of 434k. Ferritin level has fluctuated from the 10-20 range of the last 2 months . Patient notes no overt GI bleeding .  No abdominal pain.   uses PPI as needed for acid reflux. No overt hematemesis hematochezia or melena. No other evidence of overt bleeding. Was using a  lot of modified within 2015 2016 related to arthritis pain.  INTERVAL HISTORY  Patient is here for followup of her Iron deficiency anemia. She did not show up for her IV iron infusion as ordered during her last visit. Notes that she did not know about them --though we had specifically decided that was the plan. She notes no overt bleeding since her last visit.  She feels about the same.  MEDICAL HISTORY:  Past Medical History:  Diagnosis Date  . Arthritis    "bones; joints; legs; ankles" (03/17/2016)  . GERD (gastroesophageal reflux disease)   . Gout   . History of blood transfusion 06/2015; 08/2015; 03/17/2016  . History of iron deficiency anemia    took iron supplements 06/2015-3/ 2017; stopped/dr's order (03/17/2016)  . Low hemoglobin 06/2015; 08/2015; 03/17/2016  . Shortness of breath on exertion 06/03/2011  . Symptomatic anemia 06/2015; 08/2015; 03/17/2016    SURGICAL HISTORY: Past Surgical History:  Procedure Laterality Date  . CERVICAL CONIZATION W/BX  08/2004  . COLONOSCOPY N/A 06/13/2015   Procedure: COLONOSCOPY;  Surgeon: Jeani Hawking, MD;  Location: [facility];  Service: Endoscopy;  Laterality: N/A;  . ESOPHAGOGASTRODUODENOSCOPY N/A 06/13/2015   Procedure: ESOPHAGOGASTRODUODENOSCOPY (EGD)/Colonoscopy;  Surgeon: Jeani Hawking, MD;  Location: [facility];  Service: Endoscopy;  Laterality: N/A;  . GIVENS CAPSULE STUDY N/A 06/14/2015   Procedure: GIVENS CAPSULE STUDY;  Surgeon: Jeani Hawking, MD;  Location: [facility];  Service: Endoscopy;  Laterality: N/A;  . GIVENS CAPSULE STUDY N/A 03/19/2016   Procedure: GIVENS CAPSULE STUDY;  Surgeon: Jeani Hawking, MD;  Location: [facility];  Service: Endoscopy;  Laterality: N/A;  . VAGINAL HYSTERECTOMY  ~ 2005    SOCIAL HISTORY: Social History   Social History  . Marital status: Married    Spouse name: N/A  . Number of children: N/A  . Years of education: N/A   Occupational History  . Not on file.   Social History Main Topics  .  Smoking status: Never Smoker  . Smokeless tobacco: Never Used  . Alcohol use No     Comment: Occassional  . Drug use: No  . Sexual activity: Yes   Other Topics Concern  . Not on file   Social History Narrative   ** Merged History Encounter **        FAMILY HISTORY: No family history on file.  ALLERGIES:  has No Known Allergies.  MEDICATIONS:  Current Outpatient Prescriptions  Medication Sig Dispense Refill  . albuterol (PROVENTIL HFA;VENTOLIN HFA) 108 (90 BASE) MCG/ACT inhaler Inhale 1-2 puffs into the lungs every 4 (four) hours as needed for wheezing or shortness of breath. 1 Inhaler 0  . allopurinol (ZYLOPRIM) 300 MG tablet Take 300 mg by mouth daily.   5  . calcium carbonate (TUMS - DOSED IN MG ELEMENTAL CALCIUM) 500 MG chewable tablet Chew 1 tablet by mouth as needed for indigestion or heartburn.    . colchicine 0.6 MG tablet Take 0.6 mg by mouth daily as needed (gout).     [name] Kitchen. diphenhydrAMINE (BENADRYL) 25 MG tablet Take 25 mg by mouth every 6 (six) hours as needed for allergies.     . ferrous sulfate 325 (65 FE) MG tablet Take 1 tablet (325 mg total) by mouth 2 (two) times daily with a meal. 60 tablet 3  . ferrous sulfate 325 (65 FE) MG tablet Take 1 tablet (325 mg total) by mouth 2 (two) times daily with a meal. 60 tablet 0  . fluticasone (FLONASE ALLERGY RELIEF) 50 MCG/ACT nasal spray Place 1 spray into both nostrils 2 (two) times daily as needed for allergies.    . methocarbamol (ROBAXIN) 750 MG tablet Take 750 mg by mouth 2 (two) times daily as needed for muscle spasms.   5  . Olopatadine HCl 0.2 % SOLN Place 1 drop into both eyes daily as needed (for allergies).    . pantoprazole (PROTONIX) 40 MG tablet Take 1 tablet (40 mg total) by mouth daily. 60 tablet 1  . traMADol (ULTRAM) 50 MG tablet Take 1 tablet (50 mg total) by mouth every 6 (six) hours as needed. (Patient taking differently: Take 50 mg by mouth every 6 (six) hours as needed (for pain). ) 20 tablet 0   No  current facility-administered medications for this visit.     REVIEW OF SYSTEMS:    10 Point review of Systems was done is negative except as noted above.  PHYSICAL EXAMINATION: ECOG PERFORMANCE STATUS: 2 - Symptomatic, <50% confined to bed  . Vitals:   05/21/16 1445  BP: 133/82  Pulse: 72  Resp: 18  Temp: 98.6 F (37 C)   Filed Weights   05/21/16 1445  Weight: 272 lb 8 oz (123.6 kg)   .Body mass index is 43.98 kg/m.  GENERAL:alert, in no acute distress and comfortable SKIN: skin color, texture, turgor are normal, no rashes or significant lesions EYES: normal, conjunctiva are pink and non-injected, sclera clear OROPHARYNX:no exudate, no erythema and lips, buccal mucosa, and tongue normal  NECK: supple, no JVD, thyroid normal size, non-tender, without nodularity LYMPH:  no  palpable lymphadenopathy in the cervical, axillary or inguinal LUNGS: clear to auscultation with normal respiratory effort HEART: regular rate & rhythm,  no murmurs and no lower extremity edema ABDOMEN: abdomen soft, non-tender, normoactive bowel sounds  Musculoskeletal: no cyanosis of digits and no clubbing  PSYCH: alert & oriented x 3 with fluent speech NEURO: no focal motor/sensory deficits  LABORATORY DATA:  I have reviewed the data as listed  . CBC Latest Ref Rng & Units 05/21/2016 04/10/2016 03/20/2016  WBC 3.9 - 10.3 10e3/uL 6.4 7.0 7.6  Hemoglobin 11.6 - 15.9 g/dL 10.0(L) 9.8(L) 7.8(L)  Hematocrit 34.8 - 46.6 % 34.1(L) 32.3(L) 27.5(L)  Platelets 145 - 400 10e3/uL 322 434(H) 340   . CBC    Component Value Date/Time   WBC 6.4 05/21/2016 1339   WBC 7.6 03/20/2016 0653   RBC 4.09 05/21/2016 1339   RBC 3.67 (L) 03/20/2016 0653   HGB 10.0 (L) 05/21/2016 1339   HCT 34.1 (L) 05/21/2016 1339   PLT 322 05/21/2016 1339   MCV 83.4 05/21/2016 1339   MCH 24.4 (L) 05/21/2016 1339   MCH 21.3 (L) 03/20/2016 0653   MCHC 29.3 (L) 05/21/2016 1339   MCHC 28.4 (L) 03/20/2016 0653   RDW 20.2 (H)  05/21/2016 1339   LYMPHSABS 2.3 05/21/2016 1339   MONOABS 0.6 05/21/2016 1339   EOSABS 0.1 05/21/2016 1339   BASOSABS 0.0 05/21/2016 1339    . CMP Latest Ref Rng & Units 05/21/2016 04/10/2016 03/20/2016  Glucose 70 - 140 mg/dl 960(A146(H) 85 92  BUN 7.0 - 26.0 mg/dL 54.015.3 98.110.5 6  Creatinine 0.6 - 1.1 mg/dL 0.7 0.7 1.910.70  Sodium 478136 - 145 mEq/L 140 142 140  Potassium 3.5 - 5.1 mEq/L 3.7 3.8 3.7  Chloride 101 - 111 mmol/L - - 103  CO2 22 - 29 mEq/L 29 31(H) 31  Calcium 8.4 - 10.4 mg/dL 9.0 9.4 2.9(F8.2(L)  Total Protein 6.4 - 8.3 g/dL 8.1 7.8 -  Total Bilirubin 0.20 - 1.20 mg/dL 6.210.28 3.080.37 -  Alkaline Phos 40 - 150 U/L 100 81 -  AST 5 - 34 U/L 17 16 -  ALT 0 - 55 U/L 13 8 -   . Lab Results  Component Value Date   IRON 26 (L) 05/21/2016   TIBC 409 05/21/2016   IRONPCTSAT 6 (L) 05/21/2016   (Iron and TIBC)  Lab Results  Component Value Date   FERRITIN 9 05/21/2016    Component     Latest Ref Rng & Units 04/10/2016  HEMOGLOBIN F QUANTITATION     0.0 - 2.0 % 0.0  Hgb A     94.0 - 98.0 % 98.2 (H)  HGB S     0.0 % 0.0  HGB C     0.0 % 0.0  HEMOGLOBIN A2 QUANTITATION     0.7 - 3.1 % 1.8  HGB INTERPRETATION      Comment  Iron     41 - 142 ug/dL 22 (L)  TIBC     657236 - 444 ug/dL 846383  UIBC     962120 - 952384 ug/dL 841360  %SAT     21 - 57 % 6 (L)  Ferritin     9 - 269 ng/ml 20  LDH     125 - 245 U/L 203  Haptoglobin     34 - 200 mg/dL 324162  Sed Rate     0 - 40 mm/hr 41 (H)  Vitamin B12     211 - 946 pg/mL  918     RADIOGRAPHIC STUDIES: I have personally reviewed the radiological images as listed and agreed with the findings in the report. No results found.  ASSESSMENT & PLAN:   55 year old female from [place] with   1) Persistent Severe Microcytic Anemia due to Iron deficiency. She has required hospitalization with PRBC transfusions in January, March and October 2017. Her iron deficiency appears to be likely related to GI bleeding with unclear primary source  despite  extensive GI workup as noted above. Her iron profile suggests persistent Iron deficiency anemia despite PO Iron use for > 1 yr.  hemoglobin electrophoresis within normal limits to rule out overt hemoglobinopathy. No evidence of hemolysis at this time.  2) Hiatal hernia with mild erosive gastritis PLAN -Patient is aware of all the lab findings and her persistent iron deficiency. -She would like to avoid additional PRBC transfusions if possible. -during her last clinic visit we offered her IV feraheme every weekly 3 doses to correct her iron deficiency anemia and build her iron stores and scheduled this after getting her informed consent. She no showed for her IV iron and has not received it . -still has anemia with further drop in ferritin levels and fatigue. --Given high risk for ongoing GI losses we will try to keep her ferritin level more than 100. -she was counseled to ensure she follows up for IV feraheme qweekly x 3 doses -Patient was counseled that she could stop her oral iron replacement at this time . -Continue B complex vitamin to support accelerated hematopoiesis. -Avoid NSAIDs or other medications that could increase bleeding risk . -Counseled on appropriate use of iron rich foods.   Return to care with Dr. Candise Che in 8 weeks after IV iron with repeat labs .    All of the patients questions were answered with apparent satisfaction. The patient knows to call the clinic with any problems, questions or concerns.  I spent 20 minutes counseling the patient face to face. The total time spent in the appointment was 20 minutes and more than 50% was on counseling and direct patient cares.    Wyvonnia Lora MD MS AAHIVMS [facility] Hematology/Oncology Physician [facility]  (Office):       [phone] (Work cell):  [phone] (Fax):           [phone]

## 2016-06-11 ENCOUNTER — Ambulatory Visit (HOSPITAL_BASED_OUTPATIENT_CLINIC_OR_DEPARTMENT_OTHER): Payer: 59

## 2016-06-11 DIAGNOSIS — R21 Rash and other nonspecific skin eruption: Secondary | ICD-10-CM | POA: Diagnosis not present

## 2016-06-11 NOTE — Progress Notes (Signed)
Rash noted on bilateral lower extremities, pt reports that it appeared 06/09/16, it does itch, and it looks better now than it did before. Dr. Candise CheKale aware, [facility] and pt to begin Zyrtec once a day and Benadryl 25 mg every 4 to 6 hours as needed, and to call clinic if rash worsens. Pt aware and verbalizes understanding. Pt stable at discharge.

## 2016-06-11 NOTE — Patient Instructions (Signed)
Feraheme held today, pt to start Zyrtec daily and Benadryl 25 mg every 4-6 hours as needed.

## 2016-07-05 ENCOUNTER — Telehealth: Payer: Self-pay

## 2016-07-05 NOTE — Telephone Encounter (Signed)
Called and left a a message with a new appt due to call day

## 2016-07-22 ENCOUNTER — Other Ambulatory Visit: Payer: 59

## 2016-07-22 ENCOUNTER — Ambulatory Visit: Payer: 59 | Admitting: Hematology

## 2016-07-23 ENCOUNTER — Ambulatory Visit: Payer: 59 | Admitting: Hematology

## 2016-07-23 ENCOUNTER — Other Ambulatory Visit: Payer: 59

## 2016-07-31 DIAGNOSIS — Z1239 Encounter for other screening for malignant neoplasm of breast: Secondary | ICD-10-CM | POA: Diagnosis not present

## 2016-07-31 DIAGNOSIS — M1A9XX0 Chronic gout, unspecified, without tophus (tophi): Secondary | ICD-10-CM | POA: Diagnosis not present

## 2016-07-31 DIAGNOSIS — D62 Acute posthemorrhagic anemia: Secondary | ICD-10-CM | POA: Diagnosis not present

## 2016-07-31 DIAGNOSIS — Z13220 Encounter for screening for lipoid disorders: Secondary | ICD-10-CM | POA: Diagnosis not present

## 2016-07-31 DIAGNOSIS — R7303 Prediabetes: Secondary | ICD-10-CM | POA: Diagnosis not present

## 2016-07-31 DIAGNOSIS — E559 Vitamin D deficiency, unspecified: Secondary | ICD-10-CM | POA: Diagnosis not present

## 2016-07-31 MED FILL — ALLOPURINOL 300 MG TABLET: 300 | 30 days supply | Qty: 30 | Fill #0

## 2016-07-31 MED FILL — COLCHICINE 0.6 MG TABLET: 0.6 | 30 days supply | Qty: 60 | Fill #0

## 2016-08-18 ENCOUNTER — Other Ambulatory Visit: Payer: Self-pay | Admitting: Internal Medicine

## 2016-08-18 DIAGNOSIS — Z1231 Encounter for screening mammogram for malignant neoplasm of breast: Secondary | ICD-10-CM

## 2016-08-31 DIAGNOSIS — H524 Presbyopia: Secondary | ICD-10-CM | POA: Diagnosis not present

## 2016-09-08 ENCOUNTER — Ambulatory Visit
Admission: RE | Admit: 2016-09-08 | Discharge: 2016-09-08 | Disposition: A | Discharge status: Home / Self Care | Payer: 59 | Source: Ambulatory Visit | Attending: Internal Medicine | Admitting: Internal Medicine

## 2016-09-08 DIAGNOSIS — Z1231 Encounter for screening mammogram for malignant neoplasm of breast: Secondary | ICD-10-CM

## 2016-10-26 DIAGNOSIS — M1A9XX0 Chronic gout, unspecified, without tophus (tophi): Secondary | ICD-10-CM | POA: Diagnosis not present

## 2016-10-26 DIAGNOSIS — M179 Osteoarthritis of knee, unspecified: Secondary | ICD-10-CM | POA: Diagnosis not present

## 2016-10-26 DIAGNOSIS — R7303 Prediabetes: Secondary | ICD-10-CM | POA: Diagnosis not present

## 2016-10-26 DIAGNOSIS — J302 Other seasonal allergic rhinitis: Secondary | ICD-10-CM | POA: Diagnosis not present

## 2016-10-26 MED FILL — traMADol HCL 50 MG TABS: 50 | 30 days supply | Qty: 60 | Fill #0

## 2016-10-26 MED FILL — COLCHICINE 0.6 MG TABLET: 0.6 | 30 days supply | Qty: 60 | Fill #0

## 2017-02-01 DIAGNOSIS — R42 Dizziness and giddiness: Secondary | ICD-10-CM | POA: Diagnosis not present

## 2017-02-01 DIAGNOSIS — N390 Urinary tract infection, site not specified: Secondary | ICD-10-CM | POA: Diagnosis not present

## 2017-02-01 DIAGNOSIS — M179 Osteoarthritis of knee, unspecified: Secondary | ICD-10-CM | POA: Diagnosis not present

## 2017-02-01 DIAGNOSIS — R7303 Prediabetes: Secondary | ICD-10-CM | POA: Diagnosis not present

## 2017-02-01 MED FILL — MECLIZINE 25 MG TABLET: 25 | 10 days supply | Qty: 30 | Fill #0

## 2017-02-01 MED FILL — SULFAMETHOXAZOLE/TMP DS TAB: 800-160 | 10 days supply | Qty: 20 | Fill #0

## 2017-02-22 DIAGNOSIS — D62 Acute posthemorrhagic anemia: Secondary | ICD-10-CM | POA: Diagnosis not present

## 2017-02-22 DIAGNOSIS — M1A9XX0 Chronic gout, unspecified, without tophus (tophi): Secondary | ICD-10-CM | POA: Diagnosis not present

## 2017-02-22 DIAGNOSIS — M179 Osteoarthritis of knee, unspecified: Secondary | ICD-10-CM | POA: Diagnosis not present

## 2017-02-22 DIAGNOSIS — N3281 Overactive bladder: Secondary | ICD-10-CM | POA: Diagnosis not present

## 2017-02-22 DIAGNOSIS — Z23 Encounter for immunization: Secondary | ICD-10-CM | POA: Diagnosis not present

## 2017-02-22 DIAGNOSIS — R7303 Prediabetes: Secondary | ICD-10-CM | POA: Diagnosis not present

## 2017-04-14 MED FILL — COLCHICINE 0.6 MG TABS: 0.6 | 30 days supply | Qty: 60 | Fill #0

## 2017-04-14 MED FILL — ATOVAQUONE-PROGUANIL 250-10: 250-100 | 39 days supply | Qty: 39 | Fill #0

## 2017-04-14 MED FILL — ALLOPURINOL 300 MG TABLET: 300 | 30 days supply | Qty: 30 | Fill #0

## 2017-04-14 MED FILL — traMADol HCL 50 MG TABS: 50 | 30 days supply | Qty: 60 | Fill #1

## 2017-04-14 MED FILL — AZITHROMYCIN 500 MG TABLET: 500 | 3 days supply | Qty: 3 | Fill #0

## 2017-04-15 MED FILL — OLOPATADINE HCL 0.2 % SOLN: 0.2 | 30 days supply | Qty: 3 | Fill #0

## 2017-04-15 MED FILL — PANTOPRAZOLE SOD DR 40 MG T: 40 | 90 days supply | Qty: 30 | Fill #0

## 2017-04-15 MED FILL — METHOCARBAMOL 750 MG TABLET: 750 | 30 days supply | Qty: 60 | Fill #0

## 2017-07-04 ENCOUNTER — Inpatient Hospital Stay (HOSPITAL_COMMUNITY)
Admission: EM | Admit: 2017-07-04 | Discharge: 2017-07-06 | DRG: 379 | Disposition: A | Discharge status: Home / Self Care | Payer: 59 | Attending: Internal Medicine | Admitting: Internal Medicine

## 2017-07-04 ENCOUNTER — Other Ambulatory Visit: Payer: Self-pay

## 2017-07-04 ENCOUNTER — Encounter (HOSPITAL_COMMUNITY): Payer: Self-pay

## 2017-07-04 ENCOUNTER — Emergency Department (HOSPITAL_COMMUNITY): Payer: 59

## 2017-07-04 DIAGNOSIS — D649 Anemia, unspecified: Secondary | ICD-10-CM

## 2017-07-04 DIAGNOSIS — R5383 Other fatigue: Secondary | ICD-10-CM | POA: Diagnosis not present

## 2017-07-04 DIAGNOSIS — M109 Gout, unspecified: Secondary | ICD-10-CM | POA: Diagnosis present

## 2017-07-04 DIAGNOSIS — D500 Iron deficiency anemia secondary to blood loss (chronic): Secondary | ICD-10-CM

## 2017-07-04 DIAGNOSIS — R195 Other fecal abnormalities: Secondary | ICD-10-CM | POA: Diagnosis not present

## 2017-07-04 DIAGNOSIS — K921 Melena: Secondary | ICD-10-CM | POA: Diagnosis present

## 2017-07-04 DIAGNOSIS — K5900 Constipation, unspecified: Secondary | ICD-10-CM | POA: Diagnosis present

## 2017-07-04 DIAGNOSIS — Z90710 Acquired absence of both cervix and uterus: Secondary | ICD-10-CM | POA: Diagnosis not present

## 2017-07-04 DIAGNOSIS — D509 Iron deficiency anemia, unspecified: Secondary | ICD-10-CM | POA: Diagnosis not present

## 2017-07-04 DIAGNOSIS — R0602 Shortness of breath: Secondary | ICD-10-CM | POA: Diagnosis not present

## 2017-07-04 DIAGNOSIS — K219 Gastro-esophageal reflux disease without esophagitis: Secondary | ICD-10-CM | POA: Diagnosis present

## 2017-07-04 DIAGNOSIS — K922 Gastrointestinal hemorrhage, unspecified: Secondary | ICD-10-CM | POA: Diagnosis present

## 2017-07-04 HISTORY — DX: Iron deficiency anemia secondary to blood loss (chronic): D50.0

## 2017-07-04 LAB — BASIC METABOLIC PANEL
Anion gap: 12 (ref 5–15)
BUN: 19 mg/dL (ref 6–20)
CO2: 24 mmol/L (ref 22–32)
Calcium: 8.7 mg/dL — ABNORMAL LOW (ref 8.9–10.3)
Chloride: 105 mmol/L (ref 101–111)
Creatinine, Ser: 0.77 mg/dL (ref 0.44–1.00)
GFR calc Af Amer: >60 mL/min (ref >60)
GFR calc non Af Amer: >60 mL/min (ref >60)
Glucose, Bld: 89 mg/dL (ref 65–99)
Potassium: 4 mmol/L (ref 3.5–5.1)
Sodium: 141 mmol/L (ref 135–145)

## 2017-07-04 LAB — POC OCCULT BLOOD, ED: Fecal Occult Bld: POSITIVE — AB

## 2017-07-04 LAB — I-STAT TROPONIN, ED: Troponin i, poc: 0.02 ng/mL (ref 0.00–0.08)

## 2017-07-04 LAB — HEMOGLOBIN AND HEMATOCRIT, BLOOD
HCT: 15.5 % — ABNORMAL LOW (ref 36.0–46.0)
Hemoglobin: 4.1 g/dL — CL (ref 12.0–15.0)

## 2017-07-04 LAB — PREPARE RBC (CROSSMATCH)

## 2017-07-04 MED ORDER — FERROUS SULFATE 325 (65 FE) MG PO TABS
325.0000 mg | ORAL_TABLET | Freq: Two times a day (BID) | ORAL | Status: DC
  Administered 2017-07-05 – 2017-07-06 (×3): 325 mg via ORAL
  Filled 2017-07-04 (×4): qty 1

## 2017-07-04 MED ORDER — ALLOPURINOL 300 MG PO TABS: 300.0000 mg | ORAL_TABLET | Freq: Every day | ORAL | Status: DC | PRN

## 2017-07-04 MED ORDER — CALCIUM CARBONATE ANTACID 500 MG PO CHEW: 1.0000 | CHEWABLE_TABLET | Freq: Every day | ORAL | Status: DC | PRN

## 2017-07-04 MED ORDER — PANTOPRAZOLE SODIUM 40 MG PO TBEC: 40.0000 mg | DELAYED_RELEASE_TABLET | Freq: Every day | ORAL | Status: DC

## 2017-07-04 MED ORDER — METHOCARBAMOL 750 MG PO TABS: 750.0000 mg | ORAL_TABLET | Freq: Two times a day (BID) | ORAL | Status: DC | PRN

## 2017-07-04 MED ORDER — TRAMADOL HCL 50 MG PO TABS: 50.0000 mg | ORAL_TABLET | Freq: Four times a day (QID) | ORAL | Status: DC | PRN

## 2017-07-04 MED ORDER — SODIUM CHLORIDE 0.9 % IV SOLN
Freq: Once | INTRAVENOUS | Status: AC
  Administered 2017-07-04: 10 mL/h via INTRAVENOUS

## 2017-07-04 MED ORDER — ACETAMINOPHEN 650 MG RE SUPP
650.0000 mg | Freq: Four times a day (QID) | RECTAL | Status: DC | PRN
  Filled 2017-07-04: qty 1

## 2017-07-04 MED ORDER — ONDANSETRON HCL 4 MG PO TABS: 4.0000 mg | ORAL_TABLET | Freq: Four times a day (QID) | ORAL | Status: DC | PRN

## 2017-07-04 MED ORDER — PANTOPRAZOLE SODIUM 40 MG IV SOLR
40.0000 mg | Freq: Once | INTRAVENOUS | Status: AC
  Administered 2017-07-04: 40 mg via INTRAVENOUS
  Filled 2017-07-04: qty 40

## 2017-07-04 MED ORDER — POLYETHYLENE GLYCOL 3350 17 G PO PACK: 17.0000 g | PACK | Freq: Every day | ORAL | Status: DC | PRN

## 2017-07-04 MED ORDER — ACETAMINOPHEN 325 MG PO TABS: 650.0000 mg | ORAL_TABLET | Freq: Four times a day (QID) | ORAL | Status: DC | PRN

## 2017-07-04 MED ORDER — PANTOPRAZOLE SODIUM 40 MG IV SOLR
40.0000 mg | Freq: Two times a day (BID) | INTRAVENOUS | Status: DC
  Administered 2017-07-05 – 2017-07-06 (×3): 40 mg via INTRAVENOUS
  Filled 2017-07-04 (×3): qty 40

## 2017-07-04 MED ORDER — ONDANSETRON HCL 4 MG/2ML IJ SOLN: 4.0000 mg | Freq: Four times a day (QID) | INTRAMUSCULAR | Status: DC | PRN

## 2017-07-04 MED ORDER — OLOPATADINE HCL 0.1 % OP SOLN: 1.0000 [drp] | Freq: Two times a day (BID) | OPHTHALMIC | Status: DC | PRN

## 2017-07-04 MED ORDER — POLYETHYLENE GLYCOL 3350 17 G PO PACK: 17.0000 g | PACK | Freq: Every day | ORAL | Status: DC

## 2017-07-04 NOTE — ED Notes (Signed)
Report attempted, RN to call back. 

## 2017-07-04 NOTE — H&P (Signed)
History and Physical    Emily Holland ZOX:[id number] DOB: Dec 26, 1960 DOA: 07/04/2017  PCP: Fleet Contras, MD  Patient coming from: Home  I have personally briefly reviewed patient's old medical records in [facility]  Chief Complaint: Fatigue, DOE  HPI: Emily Holland is a 57 y.o. female with medical history significant of iron deficiency anemia due to chronic GI blood loss.  Extensive GI work up including EGD, colonoscopy, and capsule endoscopy was essentially negative for source in 2017.  Patient began IV iron with heme/onc.  Had allergic rxn in Jan 2018 on 3rd infusion.  Since then has been taking PO iron, but only takes once a day due to it causing constipation when she takes BID.  Presents to ED with 2 week history of generalized weakness, fatigue, SOB.  Stools are dark and sticky but she attributes this to PO iron and are essentially unchanged.   ED Course: HGB 4.1.  Stool is heme positive.  2u PRBC transfusion ordered.   Review of Systems: As per HPI otherwise 10 point review of systems negative.   Past Medical History:  Diagnosis Date  . Arthritis    "bones; joints; legs; ankles" (03/17/2016)  . GERD (gastroesophageal reflux disease)   . Gout   . History of blood transfusion 06/2015; 08/2015; 03/17/2016  . History of iron deficiency anemia    took iron supplements 06/2015-3/ 2017; stopped/dr's order (03/17/2016)  . Low hemoglobin 06/2015; 08/2015; 03/17/2016  . Shortness of breath on exertion 06/03/2011  . Symptomatic anemia 06/2015; 08/2015; 03/17/2016    Past Surgical History:  Procedure Laterality Date  . CERVICAL CONIZATION W/BX  08/2004  . COLONOSCOPY N/A 06/13/2015   Procedure: COLONOSCOPY;  Surgeon: Jeani Hawking, MD;  Location: [facility];  Service: Endoscopy;  Laterality: N/A;  . ESOPHAGOGASTRODUODENOSCOPY N/A 06/13/2015   Procedure: ESOPHAGOGASTRODUODENOSCOPY (EGD)/Colonoscopy;  Surgeon: Jeani Hawking, MD;  Location: [facility];  Service: Endoscopy;   Laterality: N/A;  . GIVENS CAPSULE STUDY N/A 06/14/2015   Procedure: GIVENS CAPSULE STUDY;  Surgeon: Jeani Hawking, MD;  Location: [facility];  Service: Endoscopy;  Laterality: N/A;  . GIVENS CAPSULE STUDY N/A 03/19/2016   Procedure: GIVENS CAPSULE STUDY;  Surgeon: Jeani Hawking, MD;  Location: [facility] ENDOSCOPY;  Service: Endoscopy;  Laterality: N/A;  . VAGINAL HYSTERECTOMY  ~ 2005     reports that  has never smoked. she has never used smokeless tobacco. She reports that she does not drink alcohol or use drugs.  Allergies  Allergen Reactions  . Feraheme [Ferumoxytol] Rash    Developed on 3rd iron infusion in Jan.    Family History  Problem Relation Age of Onset  . COPD Other      Prior to Admission medications   Medication Sig Start Date End Date Taking? Authorizing Provider  allopurinol (ZYLOPRIM) 300 MG tablet Take 300 mg by mouth daily as needed (gout).  05/16/15  Yes [provider]  calcium carbonate (TUMS - DOSED IN MG ELEMENTAL CALCIUM) 500 MG chewable tablet Chew 1 tablet by mouth daily as needed for indigestion or heartburn.    Yes [provider]  colchicine 0.6 MG tablet Take 0.6 mg by mouth 2 (two) times daily as needed (gout).    Yes [provider]  Cyanocobalamin (VITAMIN B-12 PO) Take 1 tablet by mouth at bedtime.   Yes [provider]  ferrous sulfate 325 (65 FE) MG tablet Take 1 tablet (325 mg total) by mouth 2 (two) times daily with a meal. Patient taking differently:  Take 325 mg by mouth at bedtime.  03/20/16  Yes Tyrone NineGrunz, Ryan B, MD  GuaiFENesin (MUCINEX PO) Take 1 tablet by mouth daily as needed (cough).   Yes [provider]  methocarbamol (ROBAXIN) 750 MG tablet Take 750 mg by mouth 2 (two) times daily as needed for muscle spasms.  05/16/15  Yes [provider]  Olopatadine HCl 0.2 % SOLN Place 1 drop into both eyes daily as needed (for allergies).   Yes [provider]  pantoprazole (PROTONIX) 40 MG tablet  Take 1 tablet (40 mg total) by mouth daily. Patient taking differently: Take 40 mg by mouth daily as needed (acid reflux).  06/15/15  Yes Gherghe, Daylene Katayamaostin M, MD  Phenylephrine-Pheniramine-DM [facility](THERAFLU COLD & COUGH PO) Take 1 packet by mouth daily as needed (cough).   Yes [provider]  Pseudoeph-Doxylamine-DM-APAP (NYQUIL PO) Take 5 mLs by mouth at bedtime as needed (cough).   Yes [provider]  traMADol (ULTRAM) 50 MG tablet Take 1 tablet (50 mg total) by mouth every 6 (six) hours as needed. Patient taking differently: Take 50 mg by mouth every 6 (six) hours as needed (for pain).  06/15/15  Yes Leatha GildingGherghe, Costin M, MD    Physical Exam: Vitals:   07/04/17 1915 07/04/17 1918 07/04/17 1930 07/04/17 1934  BP: (!) 99/54 (!) 99/54 (!) 106/47 (!) 106/47  Pulse: 78 80 84 77  Resp: 15 12 (!) 5 14  Temp:  99.9 F (37.7 C)  99.8 F (37.7 C)  TempSrc:  Oral  Oral  SpO2: 100%  (!) 88% 100%  Weight:    123.4 kg (272 lb)  Height:    5\' 6"  (1.676 m)    Constitutional: NAD, calm, comfortable Eyes: PERRL, lids and conjunctivae normal ENMT: Mucous membranes are moist. Posterior pharynx clear of any exudate or lesions.Normal dentition.  Neck: normal, supple, no masses, no thyromegaly Respiratory: clear to auscultation bilaterally, no wheezing, no crackles. Normal respiratory effort. No accessory muscle use.  Cardiovascular: Regular rate and rhythm, no murmurs / rubs / gallops. No extremity edema. 2+ pedal pulses. No carotid bruits.  Abdomen: no tenderness, no masses palpated. No hepatosplenomegaly. Bowel sounds positive.  Musculoskeletal: no clubbing / cyanosis. No joint deformity upper and lower extremities. Good ROM, no contractures. Normal muscle tone.  Skin: no rashes, lesions, ulcers. No induration Neurologic: CN 2-12 grossly intact. Sensation intact, DTR normal. Strength 5/5 in all 4.  Psychiatric: Normal judgment and insight. Alert and oriented x 3. Normal mood.    Labs on  Admission: I have personally reviewed following labs and imaging studies  CBC: Recent Labs  Lab 07/04/17 1608 07/04/17 1744  WBC 8.4  --   NEUTROABS 4.4  --   HGB 4.3* 4.1*  HCT 16.4* 15.5*  MCV 66.4*  --   PLT 394  --    Basic Metabolic Panel: Recent Labs  Lab 07/04/17 1608  NA 141  K 4.0  CL 105  CO2 24  GLUCOSE 89  BUN 19  CREATININE 0.77  CALCIUM 8.7*   GFR: Estimated Creatinine Clearance: 102.7 mL/min (by C-G formula based on SCr of 0.77 mg/dL). Liver Function Tests: No results for input(s): AST, ALT, ALKPHOS, BILITOT, PROT, ALBUMIN in the last 168 hours. No results for input(s): LIPASE, AMYLASE in the last 168 hours. No results for input(s): AMMONIA in the last 168 hours. Coagulation Profile: No results for input(s): INR, PROTIME in the last 168 hours. Cardiac Enzymes: No results for input(s): CKTOTAL, CKMB, CKMBINDEX, TROPONINI  in the last 168 hours. BNP (last 3 results) No results for input(s): PROBNP in the last 8760 hours. HbA1C: No results for input(s): HGBA1C in the last 72 hours. CBG: No results for input(s): GLUCAP in the last 168 hours. Lipid Profile: No results for input(s): CHOL, HDL, LDLCALC, TRIG, CHOLHDL, LDLDIRECT in the last 72 hours. Thyroid Function Tests: No results for input(s): TSH, T4TOTAL, FREET4, T3FREE, THYROIDAB in the last 72 hours. Anemia Panel: No results for input(s): VITAMINB12, FOLATE, FERRITIN, TIBC, IRON, RETICCTPCT in the last 72 hours. Urine analysis:    Component Value Date/Time   COLORURINE YELLOW 08/16/2015 2141   APPEARANCEUR CLOUDY (A) 08/16/2015 2141   LABSPEC 1.019 08/16/2015 2141   PHURINE 7.0 08/16/2015 2141   GLUCOSEU NEGATIVE 08/16/2015 2141   HGBUR NEGATIVE 08/16/2015 2141   BILIRUBINUR NEGATIVE 08/16/2015 2141   KETONESUR NEGATIVE 08/16/2015 2141   PROTEINUR NEGATIVE 08/16/2015 2141   NITRITE NEGATIVE 08/16/2015 2141   LEUKOCYTESUR NEGATIVE 08/16/2015 2141    Radiological Exams on Admission: Dg  Chest 2 View  Result Date: 07/04/2017 CLINICAL DATA:  Increasing fatigue, dizziness and shortness of breath for 3 weeks. EXAM: CHEST  2 VIEW COMPARISON:  PA and lateral chest 06/03/2011. FINDINGS: The lungs are clear. There is cardiomegaly. Large hiatal hernia is identified. No pneumothorax or pleural fluid. No acute bony abnormality. IMPRESSION: No acute disease. Cardiomegaly. Large hiatal hernia. Electronically Signed   By: Drusilla Kanner M.D.   On: 07/04/2017 16:30    EKG: Independently reviewed.  Assessment/Plan Principal Problem:   Iron deficiency anemia due to chronic blood loss Active Problems:   GI bleeding   Symptomatic anemia    1. GI bleed - 1. Suspect chronic slow bleed 2. Protonix IV 3. Clear liquid diet 4. Call GI in AM 2. Iron def anemia due to chronic blood loss, symptomatic anemia - 1. Transfuse 2u PRBC 2. Repeat CBC in AM 3. Increase PO iron to BID 4. Add PRN miralax for the constipation caused by PO Iron. 5. Allergic rxn to IV Iron in past.  DVT prophylaxis: SCDs Code Status: Full Family Communication: Husband at bedside Disposition Plan: Home after admit Consults called: None Admission status: Admit to inpatient - inpatient status for GIB, symptomatic anemia, HGB 4.4 needing transfusion.   Hillary Bow DO [facility] Pager [phone]  If 7AM-7PM, please contact day team taking care of patient [email or web address] Password [facility]  07/04/2017, 8:09 PM

## 2017-07-04 NOTE — ED Provider Notes (Signed)
Complains  generalized weakness, fatigue and shortness of breath for the past 2 weeks she has had similar symptoms in the past when anemic.  She denies any chest pain.  Denies pain anywhere.  On exam alert no distress lungs clear to auscultation heart regular rate and rhythm.  Conjunctiva are pale   Doug SouJacubowitz, Tieasha Larsen, MD 07/04/17 Rickey [id number]

## 2017-07-04 NOTE — ED Provider Notes (Signed)
[facility] EMERGENCY DEPARTMENT Provider Note   CSN: [id number] Arrival date & time: 07/04/17  1547     History   Chief Complaint Chief Complaint  Patient presents with  . weakness/SOB    HPI Emily Holland is a 57 y.o. female with a history of iron deficiency anemia requiring blood transfusion, who presents today for feeling weak and short of breath times approximately 2 weeks.  She denies CP, reports feeling generally weak and that her shortness of breath is not an issue at baseline, worsens with exertion.  She reports that she has dark, sticky stools, however attributed this to the iron that she takes.  She denies any abdominal pain, syncopal episodes, or headache.   She reports that she feels generally weak and it has been getting gradually worse.  Has been admitted for symptomatic anemia in the past, used to be followed by Dr. Candise Che from oncology for iron deficiency anemia.  She has been evaluated by endoscopy, and pill capsule study approximately 2 years ago for what was felt to be acute blood loss anemia.  No source of bleeding was identified.  HPI  Past Medical History:  Diagnosis Date  . Arthritis    "bones; joints; legs; ankles" (03/17/2016)  . GERD (gastroesophageal reflux disease)   . Gout   . History of blood transfusion 06/2015; 08/2015; 03/17/2016  . History of iron deficiency anemia    took iron supplements 06/2015-3/ 2017; stopped/dr's order (03/17/2016)  . Low hemoglobin 06/2015; 08/2015; 03/17/2016  . Shortness of breath on exertion 06/03/2011  . Symptomatic anemia 06/2015; 08/2015; 03/17/2016    Patient Active Problem List   Diagnosis Date Noted  . Iron deficiency anemia due to chronic blood loss 04/22/2016  . GERD (gastroesophageal reflux disease) 03/18/2016  . Anemia 03/17/2016  . Symptomatic anemia 03/17/2016  . Gout 08/17/2015  . Anemia, iron deficiency 08/17/2015  . GI bleeding 06/15/2015  . Acute blood loss anemia 06/11/2015  .  Hypoxemia 06/03/2011  . Fever 06/03/2011  . Obesity 06/03/2011    Past Surgical History:  Procedure Laterality Date  . CERVICAL CONIZATION W/BX  08/2004  . COLONOSCOPY N/A 06/13/2015   Procedure: COLONOSCOPY;  Surgeon: Jeani Hawking, MD;  Location: [facility] ENDOSCOPY;  Service: Endoscopy;  Laterality: N/A;  . ESOPHAGOGASTRODUODENOSCOPY N/A 06/13/2015   Procedure: ESOPHAGOGASTRODUODENOSCOPY (EGD)/Colonoscopy;  Surgeon: Jeani Hawking, MD;  Location: [facility] ENDOSCOPY;  Service: Endoscopy;  Laterality: N/A;  . GIVENS CAPSULE STUDY N/A 06/14/2015   Procedure: GIVENS CAPSULE STUDY;  Surgeon: Jeani Hawking, MD;  Location: [facility] ENDOSCOPY;  Service: Endoscopy;  Laterality: N/A;  . GIVENS CAPSULE STUDY N/A 03/19/2016   Procedure: GIVENS CAPSULE STUDY;  Surgeon: Jeani Hawking, MD;  Location: [facility] ENDOSCOPY;  Service: Endoscopy;  Laterality: N/A;  . VAGINAL HYSTERECTOMY  ~ 2005    OB History    Gravida Para Term Preterm AB Living   0 0 0 0 0     SAB TAB Ectopic Multiple Live Births   0 0 0           Home Medications    Prior to Admission medications   Medication Sig Start Date End Date Taking? Authorizing Provider  albuterol (PROVENTIL HFA;VENTOLIN HFA) 108 (90 BASE) MCG/ACT inhaler Inhale 1-2 puffs into the lungs every 4 (four) hours as needed for wheezing or shortness of breath. 06/06/11 06/05/12  Alison Murray, MD  allopurinol (ZYLOPRIM) 300 MG tablet Take 300 mg by mouth daily.  05/16/15   [provider]  calcium  carbonate (TUMS - DOSED IN MG ELEMENTAL CALCIUM) 500 MG chewable tablet Chew 1 tablet by mouth as needed for indigestion or heartburn.    [provider]  colchicine 0.6 MG tablet Take 0.6 mg by mouth daily as needed (gout).     [provider]  diphenhydrAMINE (BENADRYL) 25 MG tablet Take 25 mg by mouth every 6 (six) hours as needed for allergies.     [provider]  ferrous sulfate 325 (65 FE) MG tablet Take 1 tablet (325 mg total) by mouth 2 (two) times daily  with a meal. 08/17/15   Calvert Cantor, MD  ferrous sulfate 325 (65 FE) MG tablet Take 1 tablet (325 mg total) by mouth 2 (two) times daily with a meal. 03/20/16   Tyrone Nine, MD  fluticasone [facility] ALLERGY RELIEF) 50 MCG/ACT nasal spray Place 1 spray into both nostrils 2 (two) times daily as needed for allergies.    [provider]  methocarbamol (ROBAXIN) 750 MG tablet Take 750 mg by mouth 2 (two) times daily as needed for muscle spasms.  05/16/15   [provider]  Olopatadine HCl 0.2 % SOLN Place 1 drop into both eyes daily as needed (for allergies).    [provider]  pantoprazole (PROTONIX) 40 MG tablet Take 1 tablet (40 mg total) by mouth daily. 06/15/15   Leatha Gilding, MD  traMADol (ULTRAM) 50 MG tablet Take 1 tablet (50 mg total) by mouth every 6 (six) hours as needed. Patient taking differently: Take 50 mg by mouth every 6 (six) hours as needed (for pain).  06/15/15   Leatha Gilding, MD    Family History No family history on file.  Social History Social History   Tobacco Use  . Smoking status: Never Smoker  . Smokeless tobacco: Never Used  Substance Use Topics  . Alcohol use: No    Comment: Occassional  . Drug use: No     Allergies   Patient has no known allergies.   Review of Systems Review of Systems  Constitutional: Positive for fatigue. Negative for appetite change, diaphoresis and fever.  Respiratory: Positive for shortness of breath.   Cardiovascular: Negative for chest pain, palpitations and leg swelling.  Gastrointestinal: Negative for abdominal pain, nausea and vomiting.  Genitourinary: Negative for dysuria.  Skin: Negative for rash.  Neurological: Positive for weakness. Negative for light-headedness and headaches.  All other systems reviewed and are negative.    Physical Exam Updated Vital Signs BP (!) 119/48   Pulse 68   Temp 98.7 F (37.1 C) (Oral)   Resp 18   Ht 5\' 7"  (1.702 m)   Wt 116.6 kg (257 lb 0.9 oz)    SpO2 100%   BMI 40.26 kg/m   Physical Exam  Constitutional: She appears well-developed and well-nourished. No distress.  HENT:  Head: Normocephalic and atraumatic.  Eyes: Conjunctivae are normal.  Pale conjunctiva.   Neck: Neck supple.  Cardiovascular: Normal rate and regular rhythm.  No murmur heard. Pulmonary/Chest: Effort normal and breath sounds normal. No respiratory distress.  Abdominal: Soft. There is no tenderness.  Genitourinary: Rectal exam shows guaiac positive stool. Rectal exam shows no fissure and no tenderness.  Genitourinary Comments: Stool not melanotic.   Musculoskeletal: She exhibits no edema.  Neurological: She is alert.  Skin: Skin is warm and dry.  Psychiatric: She has a normal mood and affect.  Nursing note and vitals reviewed.    ED Treatments / Results  Labs (all labs ordered  are listed, but only abnormal results are displayed) Labs Reviewed  CBC WITH DIFFERENTIAL/PLATELET - Abnormal; Notable for the following components:      Result Value   RBC 2.47 (*)    Hemoglobin 4.3 (*)    HCT 16.4 (*)    MCV 66.4 (*)    MCH 17.4 (*)    MCHC 26.2 (*)    RDW 22.1 (*)    All other components within normal limits  BASIC METABOLIC PANEL - Abnormal; Notable for the following components:   Calcium 8.7 (*)    All other components within normal limits  HEMOGLOBIN AND HEMATOCRIT, BLOOD - Abnormal; Notable for the following components:   Hemoglobin 4.1 (*)    HCT 15.5 (*)    All other components within normal limits  POC OCCULT BLOOD, ED - Abnormal; Notable for the following components:   Fecal Occult Bld POSITIVE (*)    All other components within normal limits  HIV ANTIBODY (ROUTINE TESTING)  CBC  BASIC METABOLIC PANEL  I-STAT TROPONIN, ED  TYPE AND SCREEN  PREPARE RBC (CROSSMATCH)    EKG  EKG Interpretation  Date/Time:  Sunday July 04 2017 15:56:02 EST Ventricular Rate:  89 PR Interval:  166 QRS Duration: 94 QT Interval:  366 QTC  Calculation: 445 R Axis:   23 Text Interpretation:  Normal sinus rhythm Minimal voltage criteria for LVH, may be normal variant Borderline ECG NO STEMI Confirmed by Drema Pryardama, Pedro [phone]([id number]) on 07/04/2017 4:10:09 PM       Radiology Dg Chest 2 View  Result Date: 07/04/2017 CLINICAL DATA:  Increasing fatigue, dizziness and shortness of breath for 3 weeks. EXAM: CHEST  2 VIEW COMPARISON:  PA and lateral chest 06/03/2011. FINDINGS: The lungs are clear. There is cardiomegaly. Large hiatal hernia is identified. No pneumothorax or pleural fluid. No acute bony abnormality. IMPRESSION: No acute disease. Cardiomegaly. Large hiatal hernia. Electronically Signed   By: Drusilla Kannerhomas  Dalessio M.D.   On: 07/04/2017 16:30    Procedures Procedures  CRITICAL CARE Performed by: Lyndel SafeElizabeth Mabelle Mungin Total critical care time: 37 minutes Critical care time was exclusive of separately billable procedures and treating other patients. Critical care was necessary to treat or prevent imminent or life-threatening deterioration. Critical care was time spent personally by me on the following activities: development of treatment plan with patient and/or surrogate as well as nursing, discussions with consultants, evaluation of patient's response to treatment, examination of patient, obtaining history from patient or surrogate, ordering and performing treatments and interventions, ordering and review of laboratory studies, ordering and review of radiographic studies, pulse oximetry and re-evaluation of patient's condition.  Symptomatic anemia with hemoglobin of 4.1 requiring transfusion.    Medications Ordered in ED Medications  0.9 %  sodium chloride infusion ( Intravenous Transfusing/Transfer 07/04/17 2040)  pantoprazole (PROTONIX) injection 40 mg (40 mg Intravenous Given 07/04/17 1944)     Initial Impression / Assessment and Plan / ED Course  I have reviewed the triage vital signs and the nursing notes.  Pertinent labs &  imaging results that were available during my care of the patient were reviewed by me and considered in my medical decision making (see chart for details).  Clinical Course as of Jul 04 1952  Wynelle LinkSun Jul 04, 2017  1954 Spoke with Hospitalist Dr. Julian ReilGardner who will admit.   [EH]    Clinical Course User Index [EH] Cristina GongHammond, Camauri Craton W, PA-C   Janeece FittingEvelyne Togba Gunner presents today for evaluation of weakness and shortness of breath that has  become significantly worse over the past 2 weeks.  Labs were obtained showing a hemoglobin of 4.1 with occult blood positive stools.  Orders placed for transfusion 2 units PRBC.  She has a history of anemia requiring transfusions, however has not needed one in approximately 2 years.  CXR showing large hiatal hernia.  Given a presumed GI cause of her bleeding she was treated with IV protonix,  Hospitalist was consulted for admission and agreed to admit patient.  Patient will be admitted for continued evaluation and treatment.    This patient was seen by Dr. Ethelda Chick who evaluated the patient and agreed with my plan.    Final Clinical Impressions(s) / ED Diagnoses   Final diagnoses:  Symptomatic anemia  Occult blood positive stool    ED Discharge Orders    None       Norman Clay 07/05/17 0029    Doug Sou, MD 07/05/17 [phone]

## 2017-07-04 NOTE — ED Notes (Signed)
Pt's room assignment was changed.  Dr. Caprice KluverJacubowtiz notified of Hgb.

## 2017-07-04 NOTE — ED Notes (Addendum)
Dr. Clarene DukeLittle notified of critical Hgb.  Pt being moved to E37.

## 2017-07-04 NOTE — ED Notes (Signed)
Acuity increased due to Hgb 4.3

## 2017-07-04 NOTE — ED Triage Notes (Signed)
Patient complains of increased fatigue, dizziness, exertional SOB for 3 weeks. Denies pain, no fevers, no cough. Alert and oriented, NAD

## 2017-07-05 ENCOUNTER — Encounter (HOSPITAL_COMMUNITY): Payer: Self-pay | Admitting: General Practice

## 2017-07-05 ENCOUNTER — Encounter (HOSPITAL_COMMUNITY)
Admission: EM | Disposition: A | Discharge status: Home / Self Care | Payer: Self-pay | Source: Home / Self Care | Attending: Internal Medicine

## 2017-07-05 DIAGNOSIS — D649 Anemia, unspecified: Secondary | ICD-10-CM

## 2017-07-05 DIAGNOSIS — K922 Gastrointestinal hemorrhage, unspecified: Secondary | ICD-10-CM

## 2017-07-05 DIAGNOSIS — R195 Other fecal abnormalities: Secondary | ICD-10-CM

## 2017-07-05 DIAGNOSIS — D500 Iron deficiency anemia secondary to blood loss (chronic): Secondary | ICD-10-CM

## 2017-07-05 HISTORY — PX: GIVENS CAPSULE STUDY: SHX5432

## 2017-07-05 LAB — BASIC METABOLIC PANEL
Anion gap: 9 (ref 5–15)
BUN: 12 mg/dL (ref 6–20)
CO2: 25 mmol/L (ref 22–32)
Calcium: 8.2 mg/dL — ABNORMAL LOW (ref 8.9–10.3)
Chloride: 106 mmol/L (ref 101–111)
Creatinine, Ser: 0.61 mg/dL (ref 0.44–1.00)
GFR calc Af Amer: >60 mL/min (ref >60)
GFR calc non Af Amer: >60 mL/min (ref >60)
Glucose, Bld: 81 mg/dL (ref 65–99)
Potassium: 3.7 mmol/L (ref 3.5–5.1)
Sodium: 140 mmol/L (ref 135–145)

## 2017-07-05 LAB — CBC
HCT: 18.2 % — ABNORMAL LOW (ref 36.0–46.0)
Hemoglobin: 5.3 g/dL — CL (ref 12.0–15.0)
MCH: 20.9 pg — ABNORMAL LOW (ref 26.0–34.0)
MCHC: 29.1 g/dL — ABNORMAL LOW (ref 30.0–36.0)
MCV: 71.9 fL — ABNORMAL LOW (ref 78.0–100.0)
Platelets: 282 10*3/uL (ref 150–400)
RBC: 2.53 MIL/uL — ABNORMAL LOW (ref 3.87–5.11)
RDW: 23.8 % — ABNORMAL HIGH (ref 11.5–15.5)
WBC: 7.1 10*3/uL (ref 4.0–10.5)

## 2017-07-05 LAB — HEMOGLOBIN AND HEMATOCRIT, BLOOD
HCT: 26.3 % — ABNORMAL LOW (ref 36.0–46.0)
Hemoglobin: 8 g/dL — ABNORMAL LOW (ref 12.0–15.0)

## 2017-07-05 LAB — PREPARE RBC (CROSSMATCH)

## 2017-07-05 SURGERY — IMAGING PROCEDURE, GI TRACT, INTRALUMINAL, VIA CAPSULE

## 2017-07-05 MED ORDER — SODIUM CHLORIDE 0.9 % IV SOLN
Freq: Once | INTRAVENOUS | Status: AC
  Administered 2017-07-05: 12:00:00 via INTRAVENOUS

## 2017-07-05 MED ORDER — SODIUM CHLORIDE 0.9 % IV SOLN
INTRAVENOUS | Status: DC
  Administered 2017-07-05: 22:00:00 via INTRAVENOUS

## 2017-07-05 SURGICAL SUPPLY — 1 items: TOWEL COTTON PACK 4EA (MISCELLANEOUS) ×4 IMPLANT

## 2017-07-05 NOTE — Progress Notes (Signed)
Patient received from ED via bed. Patient is alert and oriented. Vital signs are stable. Skin assessment done with another nurse. Patient is on telemetry and continue pulse oximetry. Patient transfused 1 unit of blood  from ED and 1 unit after arrival to the unit. Patient given instruction about call bell,phone and unit routine. Bed in low position and side rail up x2. Call bell in reach.

## 2017-07-05 NOTE — Progress Notes (Signed)
PROGRESS NOTE  Emily Holland NWG:[id number] DOB: 07-08-1960 DOA: 07/04/2017 PCP: Fleet Contras, MD  HPI/Recap of past 24 hours: HPI from Dr Lyda Perone on 07/04/17 Emily Holland is a 57 y.o. female with medical history significant for recurrent and severe iron deficiency anemia due to ??chronic GI blood loss, gout, presents to the ED due to fatigue, DOE for 2 weeks. Extensive GI work up including EGD, colonoscopy, and capsule endoscopy was essentially negative for source in 2017. Pt also was also noted to have a negative HGB electrophoresis. Patient began IV iron with heme/onc, subsequently had allergic rxn in Jan 2018 on 3rd infusion and was discontinued. Since then has been taking PO iron, but only takes once a day due to it causing constipation when she takes BID. Pt reports dark stools, but ttributes this to PO iron. In the ED, pt HGB was noted to be 4.1, FOBT positive. Pt admitted for further management.  Today, patient reported feeling better after receiving 2 units of PRBC.  Patient denies any chest pain, worsening shortness of breath, nausea/vomiting, melena, hematochezia, dizziness, fever/chills.   Assessment/Plan: Principal Problem:   Iron deficiency anemia due to chronic blood loss Active Problems:   GI bleeding   Symptomatic anemia  GI Bleed likely upper Chronic, recurrent Extensive w/u done in 2017, all negative GI consulted, rec capsule endoscopy. If negative, may perform bleeding scan IV protonix Transfuse for a hgb goal of >7 Daily CBC, monitor closley  Recurrent severe iron def/microcytic anemia Hgb 4.1 on admission, 5.3 after 2U of PRBC on 07/04/17 Transfused another 2U of PRBC on 07/05/17 PO iron BID + bowel regimen Daily CBC  Gout Continue allopurinol   Code Status: Full  Family Communication: None at bedside  Disposition Plan: Home once stable   Consultants:  GI  Procedures:  Capsule endoscopy on  07/05/17  Antimicrobials:  None  DVT prophylaxis:  SCDs   Objective: Vitals:   07/05/17 1431 07/05/17 1446 07/05/17 1526 07/05/17 1717  BP: (!) 103/46 (!) 111/34 (!) 112/32 (!) 124/48  Pulse: 64 67  65  Resp: 17 18  18   Temp: 99.3 F (37.4 C) 99 F (37.2 C)  98.6 F (37 C)  TempSrc: Oral Oral  Oral  SpO2: 98% 93% 96% 94%  Weight:      Height:        Intake/Output Summary (Last 24 hours) at 07/05/2017 1851 Last data filed at 07/05/2017 1717 Gross per 24 hour  Intake 1777 ml  Output 201 ml  Net 1576 ml   Filed Weights   07/04/17 1934 07/04/17 2115  Weight: 123.4 kg (272 lb) 116.6 kg (257 lb 0.9 oz)    Exam:   General: Alert, awake, oriented  Cardiovascular: S1, S2 present, no added heart sounds  Respiratory: Chest clear bilaterally  Abdomen: Soft, nontender, nondistended, bowel sounds present  Musculoskeletal: No pedal edema bilaterally  Skin: Normal  Psychiatry: Normal mood   Data Reviewed: CBC: Recent Labs  Lab 07/04/17 1608 07/04/17 1744 07/05/17 0417  WBC 8.4  --  7.1  NEUTROABS 4.4  --   --   HGB 4.3* 4.1* 5.3*  HCT 16.4* 15.5* 18.2*  MCV 66.4*  --  71.9*  PLT 394  --  282   Basic Metabolic Panel: Recent Labs  Lab 07/04/17 1608 07/05/17 0417  NA 141 140  K 4.0 3.7  CL 105 106  CO2 24 25  GLUCOSE 89 81  BUN 19 12  CREATININE 0.77 0.61  CALCIUM  8.7* 8.2*   GFR: Estimated Creatinine Clearance: 101.2 mL/min (by C-G formula based on SCr of 0.61 mg/dL). Liver Function Tests: No results for input(s): AST, ALT, ALKPHOS, BILITOT, PROT, ALBUMIN in the last 168 hours. No results for input(s): LIPASE, AMYLASE in the last 168 hours. No results for input(s): AMMONIA in the last 168 hours. Coagulation Profile: No results for input(s): INR, PROTIME in the last 168 hours. Cardiac Enzymes: No results for input(s): CKTOTAL, CKMB, CKMBINDEX, TROPONINI in the last 168 hours. BNP (last 3 results) No results for input(s): PROBNP in the last 8760  hours. HbA1C: No results for input(s): HGBA1C in the last 72 hours. CBG: No results for input(s): GLUCAP in the last 168 hours. Lipid Profile: No results for input(s): CHOL, HDL, LDLCALC, TRIG, CHOLHDL, LDLDIRECT in the last 72 hours. Thyroid Function Tests: No results for input(s): TSH, T4TOTAL, FREET4, T3FREE, THYROIDAB in the last 72 hours. Anemia Panel: No results for input(s): VITAMINB12, FOLATE, FERRITIN, TIBC, IRON, RETICCTPCT in the last 72 hours. Urine analysis:    Component Value Date/Time   COLORURINE YELLOW 08/16/2015 2141   APPEARANCEUR CLOUDY (A) 08/16/2015 2141   LABSPEC 1.019 08/16/2015 2141   PHURINE 7.0 08/16/2015 2141   GLUCOSEU NEGATIVE 08/16/2015 2141   HGBUR NEGATIVE 08/16/2015 2141   BILIRUBINUR NEGATIVE 08/16/2015 2141   KETONESUR NEGATIVE 08/16/2015 2141   PROTEINUR NEGATIVE 08/16/2015 2141   NITRITE NEGATIVE 08/16/2015 2141   LEUKOCYTESUR NEGATIVE 08/16/2015 2141   Sepsis Labs: @LABRCNTIP (procalcitonin:4,lacticidven:4)  )No results found for this or any previous visit (from the past 240 hour(s)).    Studies: No results found.  Scheduled Meds: . ferrous sulfate  325 mg Oral BID WC  . pantoprazole (PROTONIX) IV  40 mg Intravenous Q12H    Continuous Infusions:   LOS: 1 day     Briant CedarNkeiruka J Matty Deamer, MD [facility]   If 7PM-7AM, please contact night-coverage [email or web address] Password [email or web address] 07/05/2017, 6:51 PM

## 2017-07-05 NOTE — Progress Notes (Signed)
Patient ingested capsule at 1857 without complications.  Instructions given.  Pt verbalized understanding.  Capsule study to be picked up at 0700.  Will continue to monitor patient.

## 2017-07-05 NOTE — Plan of Care (Signed)
Progressing

## 2017-07-05 NOTE — Consult Note (Signed)
Reason for Consult: Severe and recurrent IDA Referring Physician: [facility]  Emily Holland HPI: This is a 57 year old female with recurrent and severe IDA.  She underwent extensive evaluation in 2017 with and EGD, colonoscopy, enteroscopy, and capsule endoscopy.  No over findings were identified, however, at that time she was heme negative.  During this admission her stool is positive for blood and there is melena.  The melena description by the patient does not appear to be different from her baseline as she is on iron supplementation.  She only takes oral iron QD as BID is constipating for her.  An iron infusion was attempted with hematology, but she developed an allergic reaction after the third dosing.  Over the course of the past couple of weeks she started to have increasing fatigue and SOB.  Past Medical History:  Diagnosis Date  . Arthritis    "bones; joints; legs; ankles" (03/17/2016)  . GERD (gastroesophageal reflux disease)   . Gout   . History of blood transfusion 06/2015; 08/2015; 03/17/2016  . History of iron deficiency anemia    took iron supplements 06/2015-3/ 2017; stopped/dr's order (03/17/2016)  . Iron deficiency anemia due to chronic blood loss 07/04/2017  . Low hemoglobin 06/2015; 08/2015; 03/17/2016  . Shortness of breath on exertion 06/03/2011  . Symptomatic anemia 06/2015; 08/2015; 03/17/2016    Past Surgical History:  Procedure Laterality Date  . CERVICAL CONIZATION W/BX  08/2004  . COLONOSCOPY N/A 06/13/2015   Procedure: COLONOSCOPY;  Surgeon: Jeani Hawking, MD;  Location: [facility];  Service: Endoscopy;  Laterality: N/A;  . ESOPHAGOGASTRODUODENOSCOPY N/A 06/13/2015   Procedure: ESOPHAGOGASTRODUODENOSCOPY (EGD)/Colonoscopy;  Surgeon: Jeani Hawking, MD;  Location: [facility];  Service: Endoscopy;  Laterality: N/A;  . GIVENS CAPSULE STUDY N/A 06/14/2015   Procedure: GIVENS CAPSULE STUDY;  Surgeon: Jeani Hawking, MD;  Location: [facility];  Service: Endoscopy;   Laterality: N/A;  . GIVENS CAPSULE STUDY N/A 03/19/2016   Procedure: GIVENS CAPSULE STUDY;  Surgeon: Jeani Hawking, MD;  Location: [facility] ENDOSCOPY;  Service: Endoscopy;  Laterality: N/A;  . VAGINAL HYSTERECTOMY  ~ 2005    Family History  Problem Relation Age of Onset  . COPD Other     Social History:  reports that  has never smoked. she has never used smokeless tobacco. She reports that she does not drink alcohol or use drugs.  Allergies:  Allergies  Allergen Reactions  . Feraheme [Ferumoxytol] Rash    Developed on 3rd iron infusion in Jan.    Medications:  Scheduled: . ferrous sulfate  325 mg Oral BID WC  . pantoprazole (PROTONIX) IV  40 mg Intravenous Q12H   Continuous: . sodium chloride      Results for orders placed or performed during the hospital encounter of 07/04/17 (from the past 24 hour(s))  CBC with Differential     Status: Abnormal   Collection Time: 07/04/17  4:08 PM  Result Value Ref Range   WBC 8.4 4.0 - 10.5 K/uL   RBC 2.47 (L) 3.87 - 5.11 MIL/uL   Hemoglobin 4.3 (LL) 12.0 - 15.0 g/dL   HCT 16.1 (L) 09.6 - 04.5 %   MCV 66.4 (L) 78.0 - 100.0 fL   MCH 17.4 (L) 26.0 - 34.0 pg   MCHC 26.2 (L) 30.0 - 36.0 g/dL   RDW 40.9 (H) 81.1 - 91.4 %   Platelets 394 150 - 400 K/uL   Neutrophils Relative % 52 %   Lymphocytes Relative 36 %   Monocytes Relative  11 %   Eosinophils Relative 1 %   Basophils Relative 0 %   Neutro Abs 4.4 1.7 - 7.7 K/uL   Lymphs Abs 3.0 0.7 - 4.0 K/uL   Monocytes Absolute 0.9 0.1 - 1.0 K/uL   Eosinophils Absolute 0.1 0.0 - 0.7 K/uL   Basophils Absolute 0.0 0.0 - 0.1 K/uL   RBC Morphology POLYCHROMASIA PRESENT   Basic metabolic panel     Status: Abnormal   Collection Time: 07/04/17  4:08 PM  Result Value Ref Range   Sodium 141 135 - 145 mmol/L   Potassium 4.0 3.5 - 5.1 mmol/L   Chloride 105 101 - 111 mmol/L   CO2 24 22 - 32 mmol/L   Glucose, Bld 89 65 - 99 mg/dL   BUN 19 6 - 20 mg/dL   Creatinine, Ser 4.09 0.44 - 1.00 mg/dL   Calcium  8.7 (L) 8.9 - 10.3 mg/dL   GFR calc non Af Amer >60 >60 mL/min   GFR calc Af Amer >60 >60 mL/min   Anion gap 12 5 - 15  I-Stat Troponin, ED (not at [facility])     Status: None   Collection Time: 07/04/17  4:30 PM  Result Value Ref Range   Troponin i, poc 0.02 0.00 - 0.08 ng/mL   Comment 3          Type and screen [facility]     Status: None (Preliminary result)   Collection Time: 07/04/17  5:44 PM  Result Value Ref Range   ABO/RH(D) B POS    Antibody Screen NEG    Sample Expiration 07/07/2017    Unit Number [id number]    Blood Component Type RED CELLS,LR    Unit division 00    Status of Unit ISSUED,FINAL    Transfusion Status OK TO TRANSFUSE    Crossmatch Result Compatible    Unit Number [id number]    Blood Component Type RED CELLS,LR    Unit division 00    Status of Unit ISSUED,FINAL    Transfusion Status OK TO TRANSFUSE    Crossmatch Result Compatible    Unit Number [id number]    Blood Component Type RED CELLS,LR    Unit division 00    Status of Unit ISSUED    Transfusion Status OK TO TRANSFUSE    Crossmatch Result Compatible    Unit Number [id number]    Blood Component Type RED CELLS,LR    Unit division 00    Status of Unit ALLOCATED    Transfusion Status OK TO TRANSFUSE    Crossmatch Result Compatible   Prepare RBC     Status: None   Collection Time: 07/04/17  5:44 PM  Result Value Ref Range   Order Confirmation ORDER PROCESSED BY BLOOD BANK   Hemoglobin and Hematocrit     Status: Abnormal   Collection Time: 07/04/17  5:44 PM  Result Value Ref Range   Hemoglobin 4.1 (LL) 12.0 - 15.0 g/dL   HCT 42.5 (L) 95.6 - 38.7 %  POC occult blood, ED     Status: Abnormal   Collection Time: 07/04/17  6:01 PM  Result Value Ref Range   Fecal Occult Bld POSITIVE (A) NEGATIVE  CBC     Status: Abnormal   Collection Time: 07/05/17  4:17 AM  Result Value Ref Range   WBC 7.1 4.0 - 10.5 K/uL   RBC 2.53 (L) 3.87 - 5.11 MIL/uL   Hemoglobin 5.3 (LL) 12.0 -  15.0 g/dL   HCT 56.4 (  L) 36.0 - 46.0 %   MCV 71.9 (L) 78.0 - 100.0 fL   MCH 20.9 (L) 26.0 - 34.0 pg   MCHC 29.1 (L) 30.0 - 36.0 g/dL   RDW 45.423.8 (H) 09.811.5 - 11.915.5 %   Platelets 282 150 - 400 K/uL  Basic metabolic panel     Status: Abnormal   Collection Time: 07/05/17  4:17 AM  Result Value Ref Range   Sodium 140 135 - 145 mmol/L   Potassium 3.7 3.5 - 5.1 mmol/L   Chloride 106 101 - 111 mmol/L   CO2 25 22 - 32 mmol/L   Glucose, Bld 81 65 - 99 mg/dL   BUN 12 6 - 20 mg/dL   Creatinine, Ser 1.470.61 0.44 - 1.00 mg/dL   Calcium 8.2 (L) 8.9 - 10.3 mg/dL   GFR calc non Af Amer >60 >60 mL/min   GFR calc Af Amer >60 >60 mL/min   Anion gap 9 5 - 15  Prepare RBC     Status: None   Collection Time: 07/05/17  8:05 AM  Result Value Ref Range   Order Confirmation ORDER PROCESSED BY BLOOD BANK      Dg Chest 2 View  Result Date: 07/04/2017 CLINICAL DATA:  Increasing fatigue, dizziness and shortness of breath for 3 weeks. EXAM: CHEST  2 VIEW COMPARISON:  PA and lateral chest 06/03/2011. FINDINGS: The lungs are clear. There is cardiomegaly. Large hiatal hernia is identified. No pneumothorax or pleural fluid. No acute bony abnormality. IMPRESSION: No acute disease. Cardiomegaly. Large hiatal hernia. Electronically Signed   By: Drusilla Kannerhomas  Dalessio M.D.   On: 07/04/2017 16:30    ROS:  As stated above in the HPI otherwise negative.  Blood pressure (!) 102/41, pulse 62, temperature 99.5 F (37.5 C), temperature source Oral, resp. rate 18, height 5\' 7"  (1.702 m), weight 116.6 kg (257 lb 0.9 oz), SpO2 93 %.    PE: Gen: NAD, Alert and Oriented HEENT:  [place]/AT, EOMI Neck: Supple, no LAD Lungs: CTA Bilaterally CV: RRR without M/G/R ABM: Soft, NTND, +BS Ext: No C/C/E  Assessment/Plan: 1) Severe and recurrent IDA. 2) Heme positive stool. 3) Fatigue.   The highest yield is to repeat the capsule endoscopy.  If this is negative a bleeding scan may be helpful.  The yield will be higher with a bleeding scan if  she has persistent melena.  Plan: 1) Capsule endoscopy today.  Izick Gasbarro D 07/05/2017, 12:30 PM

## 2017-07-06 LAB — CBC WITH DIFFERENTIAL/PLATELET
Basophils Absolute: 0.1 10*3/uL (ref 0.0–0.1)
Basophils Relative: 1 %
Eosinophils Absolute: 0.1 10*3/uL (ref 0.0–0.7)
Eosinophils Relative: 1 %
HCT: 23.4 % — ABNORMAL LOW (ref 36.0–46.0)
Hemoglobin: 7.4 g/dL — ABNORMAL LOW (ref 12.0–15.0)
Lymphocytes Relative: 36 %
Lymphs Abs: 2.3 10*3/uL (ref 0.7–4.0)
MCH: 23.6 pg — ABNORMAL LOW (ref 26.0–34.0)
MCHC: 31.6 g/dL (ref 30.0–36.0)
MCV: 74.5 fL — ABNORMAL LOW (ref 78.0–100.0)
Monocytes Absolute: 0.7 10*3/uL (ref 0.1–1.0)
Monocytes Relative: 10 %
Neutro Abs: 3.3 10*3/uL (ref 1.7–7.7)
Neutrophils Relative %: 52 %
Platelets: 247 10*3/uL (ref 150–400)
RBC: 3.14 MIL/uL — ABNORMAL LOW (ref 3.87–5.11)
RDW: 23 % — ABNORMAL HIGH (ref 11.5–15.5)
WBC: 6.5 10*3/uL (ref 4.0–10.5)

## 2017-07-06 LAB — BPAM RBC
Blood Product Expiration Date: 201902022359
Blood Product Expiration Date: 201902042359
Blood Product Expiration Date: 201902142359
Blood Product Expiration Date: 201902142359
ISSUE DATE / TIME: 201901271858
ISSUE DATE / TIME: 201901272240
ISSUE DATE / TIME: 201901281123
ISSUE DATE / TIME: 201901281418
Unit Type and Rh: 1700
Unit Type and Rh: 7300
Unit Type and Rh: 7300
Unit Type and Rh: 7300

## 2017-07-06 LAB — TYPE AND SCREEN
ABO/RH(D): B POS
Antibody Screen: NEGATIVE
Unit division: 0
Unit division: 0
Unit division: 0
Unit division: 0

## 2017-07-06 LAB — BASIC METABOLIC PANEL
Anion gap: 10 (ref 5–15)
BUN: 8 mg/dL (ref 6–20)
CO2: 27 mmol/L (ref 22–32)
Calcium: 8.1 mg/dL — ABNORMAL LOW (ref 8.9–10.3)
Chloride: 102 mmol/L (ref 101–111)
Creatinine, Ser: 0.63 mg/dL (ref 0.44–1.00)
GFR calc Af Amer: >60 mL/min (ref >60)
GFR calc non Af Amer: >60 mL/min (ref >60)
Glucose, Bld: 87 mg/dL (ref 65–99)
Potassium: 3.2 mmol/L — ABNORMAL LOW (ref 3.5–5.1)
Sodium: 139 mmol/L (ref 135–145)

## 2017-07-06 LAB — HIV ANTIBODY (ROUTINE TESTING W REFLEX): HIV Screen 4th Generation wRfx: NONREACTIVE

## 2017-07-06 MED ORDER — POLYETHYLENE GLYCOL 3350 17 G PO PACK: 17.0000 g | PACK | Freq: Every day | ORAL | 0 refills | Status: AC | PRN

## 2017-07-06 MED ORDER — SENNOSIDES-DOCUSATE SODIUM 8.6-50 MG PO TABS: 2.0000 | ORAL_TABLET | Freq: Every day | ORAL | 0 refills | Status: DC | PRN

## 2017-07-06 NOTE — Progress Notes (Signed)
Patient discharged home. Family here to get patient. Patient wheeled down to her car. Prescriptions called in and appointments made. Patient stable.

## 2017-07-06 NOTE — Discharge Summary (Signed)
Discharge Summary  Emily Holland ZOX:[id number] DOB: 26-Jun-1960  PCP: Fleet Contras, MD  Admit date: 07/04/2017 Discharge date: 07/06/2017  Time spent: >30 mins  Recommendations for Outpatient Follow-up:  1. PCP 2. GI   Discharge Diagnoses:  Active Hospital Problems   Diagnosis Date Noted  . Iron deficiency anemia due to chronic blood loss 04/22/2016  . Symptomatic anemia 03/17/2016  . GI bleeding 06/15/2015    Resolved Hospital Problems  No resolved problems to display.    Discharge Condition: Stable  Diet recommendation: Heart healthy  Vitals:   07/06/17 0437 07/06/17 1321  BP: (!) 100/40 (!) 106/46  Pulse: (!) 58 66  Resp: 16 18  Temp: 98.4 F (36.9 C) 99.1 F (37.3 C)  SpO2: 98% 93%    History of present illness:  Emily Togba Tarlueis a 57 y.o.femalewith medical history significant for recurrent and severe iron deficiency anemia due to ??chronic GI blood loss, gout, presents to the ED due to fatigue, DOE for 2 weeks. Extensive GI work up including EGD, colonoscopy, and capsule endoscopy was essentially negative for source in 2017. Pt also was also noted to have a negative HGB electrophoresis. Patient began IV iron with heme/onc, subsequently had allergic rxn in Jan 2018 on 3rd infusion and was discontinued. Since then has been taking PO iron, but only takes once a day due to it causing constipation when she takes BID. Pt reports dark stools, but ttributes this to PO iron. In the ED, pt HGB was noted to be 4.1, FOBT positive. Pt admitted for further management.  Today, patient reported feeling much better, denies any signs of active bleeding, worsening shortness of breath, chest pain, abdominal pain, fever/chills, melena/hematochezia, dizziness.  Patient stable for discharge with follow-up with GI  Hospital Course:  Principal Problem:   Iron deficiency anemia due to chronic blood loss Active Problems:   GI bleeding   Symptomatic anemia  GI Bleed  likely upper Etiology unknown Chronic, recurrent Extensive w/u done in 2017, all negative GI consulted, capsule endoscopy done on 07/05/17 negative Follow-up with GI and PCP  Recurrent severe iron def/microcytic anemia Hgb 4.1 on admission-->7.4 s/p 4 U of PRBC Advised to adhere to PO iron BID + bowel regimen, as pt is allergic to feraheme PCP and GI follow up  Gout Continue allopurinol    Procedures:  Capsule endoscopy on 07/05/17  Consultations:  GI  Discharge Exam: BP (!) 106/46 (BP Location: Left Arm)   Pulse 66   Temp 99.1 F (37.3 C) (Oral)   Resp 18   Ht 5\' 7"  (1.702 m)   Wt 116.6 kg (257 lb 0.9 oz)   SpO2 93%   BMI 40.26 kg/m   General: Alert, awake, oriented Cardiovascular: S1, S2 present, no added heart sounds Respiratory: Chest clear bilaterally Abdomen: Soft, nontender, nondistended, bowel sounds present  Discharge Instructions You were cared for by a hospitalist during your hospital stay. If you have any questions about your discharge medications or the care you received while you were in the hospital after you are discharged, you can call the unit and asked to speak with the hospitalist on call if the hospitalist that took care of you is not available. Once you are discharged, your primary care physician will handle any further medical issues. Please note that NO REFILLS for any discharge medications will be authorized once you are discharged, as it is imperative that you return to your primary care physician (or establish a relationship with a primary care physician  if you do not have one) for your aftercare needs so that they can reassess your need for medications and monitor your lab values.  Discharge Instructions    Diet - low sodium heart healthy   Complete by:  As directed    Increase activity slowly   Complete by:  As directed      Allergies as of 07/06/2017      Reactions   Feraheme [ferumoxytol] Rash   Developed on 3rd iron infusion in  Jan.      Medication List    TAKE these medications   allopurinol 300 MG tablet Commonly known as:  ZYLOPRIM Take 300 mg by mouth daily as needed (gout).   calcium carbonate 500 MG chewable tablet Commonly known as:  TUMS - dosed in mg elemental calcium Chew 1 tablet by mouth daily as needed for indigestion or heartburn.   colchicine 0.6 MG tablet Take 0.6 mg by mouth 2 (two) times daily as needed (gout).   ferrous sulfate 325 (65 FE) MG tablet Take 1 tablet (325 mg total) by mouth 2 (two) times daily with a meal. What changed:  when to take this   methocarbamol 750 MG tablet Commonly known as:  ROBAXIN Take 750 mg by mouth 2 (two) times daily as needed for muscle spasms.   MUCINEX PO Take 1 tablet by mouth daily as needed (cough).   NYQUIL PO Take 5 mLs by mouth at bedtime as needed (cough).   Olopatadine HCl 0.2 % Soln Place 1 drop into both eyes daily as needed (for allergies).   pantoprazole 40 MG tablet Commonly known as:  PROTONIX Take 1 tablet (40 mg total) by mouth daily. What changed:    when to take this  reasons to take this   polyethylene glycol packet Commonly known as:  MIRALAX / GLYCOLAX Take 17 g by mouth daily as needed for mild constipation or moderate constipation.   senna-docusate 8.6-50 MG tablet Commonly known as:  Senokot-S Take 2 tablets by mouth daily as needed for mild constipation.   THERAFLU COLD & COUGH PO Take 1 packet by mouth daily as needed (cough).   traMADol 50 MG tablet Commonly known as:  ULTRAM Take 1 tablet (50 mg total) by mouth every 6 (six) hours as needed. What changed:  reasons to take this   VITAMIN B-12 PO Take 1 tablet by mouth at bedtime.      Allergies  Allergen Reactions  . Feraheme [Ferumoxytol] Rash    Developed on 3rd iron infusion in Jan.   Follow-up Information    Fleet ContrasAvbuere, Edwin, MD. Schedule an appointment as soon as possible for a visit in 1 week(s).   Specialty:  Internal Medicine Contact  information: [address] [place] [address] [phone]        Jeani HawkingHung, Patrick, MD. Schedule an appointment as soon as possible for a visit in 2 week(s).   Specialty:  Gastroenterology Contact information: [address], [address] [place] [address] [phone]            The results of significant diagnostics from this hospitalization (including imaging, microbiology, ancillary and laboratory) are listed below for reference.    Significant Diagnostic Studies: Dg Chest 2 View  Result Date: 07/04/2017 CLINICAL DATA:  Increasing fatigue, dizziness and shortness of breath for 3 weeks. EXAM: CHEST  2 VIEW COMPARISON:  PA and lateral chest 06/03/2011. FINDINGS: The lungs are clear. There is cardiomegaly. Large hiatal hernia is identified. No pneumothorax or pleural fluid. No  acute bony abnormality. IMPRESSION: No acute disease. Cardiomegaly. Large hiatal hernia. Electronically Signed   By: Drusilla Kanner M.D.   On: 07/04/2017 16:30    Microbiology: No results found for this or any previous visit (from the past 240 hour(s)).   Labs: Basic Metabolic Panel: Recent Labs  Lab 07/04/17 1608 07/05/17 0417 07/06/17 0314  NA 141 140 139  K 4.0 3.7 3.2*  CL 105 106 102  CO2 24 25 27   GLUCOSE 89 81 87  BUN 19 12 8   CREATININE 0.77 0.61 0.63  CALCIUM 8.7* 8.2* 8.1*   Liver Function Tests: No results for input(s): AST, ALT, ALKPHOS, BILITOT, PROT, ALBUMIN in the last 168 hours. No results for input(s): LIPASE, AMYLASE in the last 168 hours. No results for input(s): AMMONIA in the last 168 hours. CBC: Recent Labs  Lab 07/04/17 1608 07/04/17 1744 07/05/17 0417 07/05/17 1803 07/06/17 0314  WBC 8.4  --  7.1  --  6.5  NEUTROABS 4.4  --   --   --  3.3  HGB 4.3* 4.1* 5.3* 8.0* 7.4*  HCT 16.4* 15.5* 18.2* 26.3* 23.4*  MCV 66.4*  --  71.9*  --  74.5*  PLT 394  --  282  --  247   Cardiac Enzymes: No results for input(s): CKTOTAL, CKMB, CKMBINDEX, TROPONINI  in the last 168 hours. BNP: BNP (last 3 results) No results for input(s): BNP in the last 8760 hours.  ProBNP (last 3 results) No results for input(s): PROBNP in the last 8760 hours.  CBG: No results for input(s): GLUCAP in the last 168 hours.     Signed:  Briant Cedar, MD [facility] 07/06/2017, 8:11 PM

## 2017-07-07 ENCOUNTER — Encounter (HOSPITAL_COMMUNITY): Payer: Self-pay | Admitting: Gastroenterology

## 2017-07-07 ENCOUNTER — Other Ambulatory Visit: Payer: Self-pay | Admitting: *Deleted

## 2017-07-07 MED FILL — PANTOPRAZOLE SOD DR 40 MG T: 40 | 30 days supply | Qty: 30 | Fill #1

## 2017-07-07 NOTE — Patient Outreach (Signed)
[facility]) Care Management  07/07/2017  Emily Holland 11/27/1960 [id number]   Subjective: Telephone call to patient's home number, no answer, left HIPAA compliant voicemail message, and requested call back.    Objective: Per KPN (Knowledge Performance Now, point of care tool) and chart review, patient hospitalized 07/04/17 -06/2917 for iron deficiency anemia.    Patient also has a history of gout and iron deficiency due to chronic blood loss.     Assessment:  Received [facility] Transition of care referral on 07/05/17.   Transition of care follow up pending patient contact.      Plan: RNCM will call patient for 2nd telephone outreach attempt, transition of care follow up, within 10 business days if no return call.     Herndon Grill H. Gardiner Barefootooper RN, BSN, CCM [facility] Care [facility] Telephonic CM Phone: [phone] Fax: [phone]

## 2017-07-08 ENCOUNTER — Ambulatory Visit: Payer: Self-pay | Admitting: *Deleted

## 2017-07-08 ENCOUNTER — Other Ambulatory Visit: Payer: Self-pay | Admitting: *Deleted

## 2017-07-08 NOTE — Patient Outreach (Signed)
[facility] Care Management  07/08/2017  Emily Holland 02/05/1961 [id number]   Subjective: Telephone call to patient's home  number, no answer, message states no one is available to take call, and unable to leave a message.     Objective: Per KPN (Knowledge Performance Now, point of care tool) and chart review, patient hospitalized 07/04/17 -06/2917 for iron deficiency anemia.    Patient also has a history of gout and iron deficiency due to chronic blood loss.     Assessment:  Received [facility] Transition of care referral on 07/05/17.   Transition of care follow up pending patient contact.      Plan: RNCM will call patient for 3rd telephone outreach attempt, transition of care follow up, within 10 business days if no return call.     Emily Reiland H. Gardiner Barefootooper RN, BSN, CCM [facility] Care [facility] Telephonic CM Phone: [phone] Fax: [phone]

## 2017-07-09 ENCOUNTER — Encounter: Payer: Self-pay | Admitting: *Deleted

## 2017-07-09 ENCOUNTER — Ambulatory Visit: Payer: Self-pay | Admitting: *Deleted

## 2017-07-09 ENCOUNTER — Other Ambulatory Visit: Payer: Self-pay | Admitting: *Deleted

## 2017-07-09 LAB — CBC WITH DIFFERENTIAL/PLATELET
Basophils Absolute: 0 10*3/uL (ref 0.0–0.1)
Basophils Relative: 0 %
Eosinophils Absolute: 0.1 10*3/uL (ref 0.0–0.7)
Eosinophils Relative: 1 %
HCT: 16.4 % — ABNORMAL LOW (ref 36.0–46.0)
Hemoglobin: 4.3 g/dL — CL (ref 12.0–15.0)
Lymphocytes Relative: 36 %
Lymphs Abs: 3 10*3/uL (ref 0.7–4.0)
MCH: 17.4 pg — ABNORMAL LOW (ref 26.0–34.0)
MCHC: 26.2 g/dL — ABNORMAL LOW (ref 30.0–36.0)
MCV: 66.4 fL — ABNORMAL LOW (ref 78.0–100.0)
Monocytes Absolute: 0.9 10*3/uL (ref 0.1–1.0)
Monocytes Relative: 11 %
Neutro Abs: 4.4 10*3/uL (ref 1.7–7.7)
Neutrophils Relative %: 52 %
Platelets: 394 10*3/uL (ref 150–400)
RBC: 2.47 MIL/uL — ABNORMAL LOW (ref 3.87–5.11)
RDW: 22.1 % — ABNORMAL HIGH (ref 11.5–15.5)
WBC: 8.4 10*3/uL (ref 4.0–10.5)

## 2017-07-09 NOTE — Patient Outreach (Signed)
[facility] Care Management  07/09/2017  Emily Holland 08/07/1960 [id number]   Subjective: Telephone call to patient's mobile number, spoke with patient, and HIPAA verified.  Discussed [facility] Transition of care follow up, patient voiced understanding, and is in agreement to follow up.  Patient states she is doing better, getting stronger, doctors are still not sure of the cause of her anemia, numbness in right hand (pinky, ring finger) is improving but still present.  States she was evaluated for the numbness during her recent hospitalization.  Patient states she will call MD's office today to report continued symptom and see if she needs a sooner  appointment.  States her follow up appointment with primary MD is 07/14/17 and gastroenterologist is 07/19/17.  Patient states she is able to manage self care and has assistance as needed with activities of daily living / home management.    Patient voices understanding of medical diagnosis and treatment plan.  States she is accessing the following [facility] benefits: outpatient pharmacy, hospital indemnity (not sure, will call to verify, file claim if appropriate, verbally given contact number for [facility] Benefit Specialist [phone], given number for [facility] [phone]), not sure if eligible for family medical leave act (FMLA,  and states she will ask her husband (who is the [employer] Employee) to follow up with benefit specialist.   Patient states she does not have any education material, transition of care, care coordination, disease management, disease monitoring, transportation, Publishing rights managercommunity resource, or pharmacy needs at this time.  States she is very appreciative of the follow up and is in agreement to receive [facility] Care [facility] information.     Objective: Per KPN (Knowledge Performance Now, point of care tool) and chart review, patient hospitalized 07/04/17 -06/2917 for iron deficiency anemia.     Patient also has a history of gout and iron deficiency due to chronic blood loss.      Assessment:  Received [facility] Transition of care referral on 07/05/17.   Transition of care follow up completed, no care management needs, and will proceed with case closure.       Plan: RNCM will send patient successful outreach letter, [facility] pamphlet, and magnet. RNCM will send case closure due to follow up completed / no care management needs request to Iverson AlaminLaura Greeson at [facility].     Clorine Swing H. Gardiner Barefootooper RN, BSN, CCM [facility] Care Management [facility] Telephonic CM Phone: [phone] Fax: [phone]

## 2017-07-14 DIAGNOSIS — K922 Gastrointestinal hemorrhage, unspecified: Secondary | ICD-10-CM | POA: Diagnosis not present

## 2017-07-14 DIAGNOSIS — Z1239 Encounter for other screening for malignant neoplasm of breast: Secondary | ICD-10-CM | POA: Diagnosis not present

## 2017-07-14 DIAGNOSIS — E2839 Other primary ovarian failure: Secondary | ICD-10-CM | POA: Diagnosis not present

## 2017-07-14 DIAGNOSIS — Z13220 Encounter for screening for lipoid disorders: Secondary | ICD-10-CM | POA: Diagnosis not present

## 2017-07-14 DIAGNOSIS — M1A9XX0 Chronic gout, unspecified, without tophus (tophi): Secondary | ICD-10-CM | POA: Diagnosis not present

## 2017-07-14 DIAGNOSIS — D62 Acute posthemorrhagic anemia: Secondary | ICD-10-CM | POA: Diagnosis not present

## 2017-07-14 DIAGNOSIS — R7303 Prediabetes: Secondary | ICD-10-CM | POA: Diagnosis not present

## 2017-07-19 DIAGNOSIS — D509 Iron deficiency anemia, unspecified: Secondary | ICD-10-CM | POA: Diagnosis not present

## 2017-07-19 DIAGNOSIS — R5383 Other fatigue: Secondary | ICD-10-CM | POA: Diagnosis not present

## 2017-07-19 DIAGNOSIS — R195 Other fecal abnormalities: Secondary | ICD-10-CM | POA: Diagnosis not present

## 2017-07-21 ENCOUNTER — Other Ambulatory Visit: Payer: Self-pay | Admitting: Internal Medicine

## 2017-07-21 DIAGNOSIS — Z1231 Encounter for screening mammogram for malignant neoplasm of breast: Secondary | ICD-10-CM

## 2017-07-23 MED FILL — NOREL AD TABLET: 4-10-325 | 10 days supply | Qty: 20 | Fill #0

## 2017-08-11 DIAGNOSIS — D62 Acute posthemorrhagic anemia: Secondary | ICD-10-CM | POA: Diagnosis not present

## 2017-08-11 DIAGNOSIS — M1A9XX0 Chronic gout, unspecified, without tophus (tophi): Secondary | ICD-10-CM | POA: Diagnosis not present

## 2017-08-11 DIAGNOSIS — M179 Osteoarthritis of knee, unspecified: Secondary | ICD-10-CM | POA: Diagnosis not present

## 2017-08-11 DIAGNOSIS — R7303 Prediabetes: Secondary | ICD-10-CM | POA: Diagnosis not present

## 2017-08-11 DIAGNOSIS — E2839 Other primary ovarian failure: Secondary | ICD-10-CM | POA: Diagnosis not present

## 2017-08-17 ENCOUNTER — Other Ambulatory Visit: Payer: Self-pay | Admitting: Internal Medicine

## 2017-08-17 DIAGNOSIS — E2839 Other primary ovarian failure: Secondary | ICD-10-CM

## 2017-09-09 ENCOUNTER — Ambulatory Visit
Admission: RE | Admit: 2017-09-09 | Discharge: 2017-09-09 | Disposition: A | Discharge status: Home / Self Care | Payer: 59 | Source: Ambulatory Visit | Attending: Internal Medicine | Admitting: Internal Medicine

## 2017-09-09 ENCOUNTER — Ambulatory Visit: Payer: 59

## 2017-09-09 DIAGNOSIS — Z1231 Encounter for screening mammogram for malignant neoplasm of breast: Secondary | ICD-10-CM | POA: Diagnosis not present

## 2017-09-09 DIAGNOSIS — M8588 Other specified disorders of bone density and structure, other site: Secondary | ICD-10-CM | POA: Diagnosis not present

## 2017-09-09 DIAGNOSIS — E2839 Other primary ovarian failure: Secondary | ICD-10-CM

## 2017-09-09 DIAGNOSIS — Z780 Asymptomatic menopausal state: Secondary | ICD-10-CM | POA: Diagnosis not present

## 2017-09-22 DIAGNOSIS — M179 Osteoarthritis of knee, unspecified: Secondary | ICD-10-CM | POA: Diagnosis not present

## 2017-09-22 DIAGNOSIS — J302 Other seasonal allergic rhinitis: Secondary | ICD-10-CM | POA: Diagnosis not present

## 2017-09-22 DIAGNOSIS — D62 Acute posthemorrhagic anemia: Secondary | ICD-10-CM | POA: Diagnosis not present

## 2017-09-22 DIAGNOSIS — R7303 Prediabetes: Secondary | ICD-10-CM | POA: Diagnosis not present

## 2017-09-22 MED FILL — OLOPATADINE HCL 0.2% EYE DR: 0.2 | 25 days supply | Qty: 3 | Fill #0

## 2017-09-22 MED FILL — PANTOPRAZOLE SOD DR 40 MG T: 40 | 30 days supply | Qty: 30 | Fill #2

## 2017-11-03 DIAGNOSIS — R7303 Prediabetes: Secondary | ICD-10-CM | POA: Diagnosis not present

## 2017-11-03 DIAGNOSIS — J302 Other seasonal allergic rhinitis: Secondary | ICD-10-CM | POA: Diagnosis not present

## 2017-11-03 DIAGNOSIS — M179 Osteoarthritis of knee, unspecified: Secondary | ICD-10-CM | POA: Diagnosis not present

## 2017-11-03 DIAGNOSIS — N3080 Other cystitis without hematuria: Secondary | ICD-10-CM | POA: Diagnosis not present

## 2017-11-03 MED FILL — NITROFURANTOIN MONO-MCR 100: 100 | 7 days supply | Qty: 14 | Fill #0

## 2018-02-10 DIAGNOSIS — H04123 Dry eye syndrome of bilateral lacrimal glands: Secondary | ICD-10-CM | POA: Diagnosis not present

## 2018-02-10 DIAGNOSIS — H11153 Pinguecula, bilateral: Secondary | ICD-10-CM | POA: Diagnosis not present

## 2018-02-10 DIAGNOSIS — H5202 Hypermetropia, left eye: Secondary | ICD-10-CM | POA: Diagnosis not present

## 2018-02-10 DIAGNOSIS — H524 Presbyopia: Secondary | ICD-10-CM | POA: Diagnosis not present

## 2018-02-10 DIAGNOSIS — I10 Essential (primary) hypertension: Secondary | ICD-10-CM | POA: Diagnosis not present

## 2018-02-11 DIAGNOSIS — R7303 Prediabetes: Secondary | ICD-10-CM | POA: Diagnosis not present

## 2018-02-11 DIAGNOSIS — J302 Other seasonal allergic rhinitis: Secondary | ICD-10-CM | POA: Diagnosis not present

## 2018-02-11 DIAGNOSIS — M1A9XX0 Chronic gout, unspecified, without tophus (tophi): Secondary | ICD-10-CM | POA: Diagnosis not present

## 2018-02-11 DIAGNOSIS — N951 Menopausal and female climacteric states: Secondary | ICD-10-CM | POA: Diagnosis not present

## 2018-02-14 ENCOUNTER — Encounter (HOSPITAL_COMMUNITY): Payer: Self-pay | Admitting: Emergency Medicine

## 2018-02-14 ENCOUNTER — Other Ambulatory Visit: Payer: Self-pay

## 2018-02-14 ENCOUNTER — Observation Stay (HOSPITAL_COMMUNITY)
Admission: EM | Admit: 2018-02-14 | Discharge: 2018-02-16 | Disposition: A | Discharge status: Home / Self Care | Payer: 59 | Attending: Internal Medicine | Admitting: Internal Medicine

## 2018-02-14 DIAGNOSIS — Z23 Encounter for immunization: Secondary | ICD-10-CM | POA: Diagnosis not present

## 2018-02-14 DIAGNOSIS — K5900 Constipation, unspecified: Secondary | ICD-10-CM | POA: Insufficient documentation

## 2018-02-14 DIAGNOSIS — M109 Gout, unspecified: Secondary | ICD-10-CM | POA: Diagnosis not present

## 2018-02-14 DIAGNOSIS — K922 Gastrointestinal hemorrhage, unspecified: Secondary | ICD-10-CM | POA: Diagnosis present

## 2018-02-14 DIAGNOSIS — K449 Diaphragmatic hernia without obstruction or gangrene: Secondary | ICD-10-CM | POA: Insufficient documentation

## 2018-02-14 DIAGNOSIS — Z6841 Body Mass Index (BMI) 40.0 and over, adult: Secondary | ICD-10-CM | POA: Insufficient documentation

## 2018-02-14 DIAGNOSIS — D62 Acute posthemorrhagic anemia: Secondary | ICD-10-CM | POA: Diagnosis present

## 2018-02-14 DIAGNOSIS — D649 Anemia, unspecified: Secondary | ICD-10-CM | POA: Diagnosis not present

## 2018-02-14 DIAGNOSIS — K264 Chronic or unspecified duodenal ulcer with hemorrhage: Secondary | ICD-10-CM | POA: Diagnosis not present

## 2018-02-14 DIAGNOSIS — E669 Obesity, unspecified: Secondary | ICD-10-CM | POA: Insufficient documentation

## 2018-02-14 DIAGNOSIS — D500 Iron deficiency anemia secondary to blood loss (chronic): Principal | ICD-10-CM | POA: Insufficient documentation

## 2018-02-14 DIAGNOSIS — R0602 Shortness of breath: Secondary | ICD-10-CM

## 2018-02-14 DIAGNOSIS — Z79899 Other long term (current) drug therapy: Secondary | ICD-10-CM | POA: Insufficient documentation

## 2018-02-14 DIAGNOSIS — K219 Gastro-esophageal reflux disease without esophagitis: Secondary | ICD-10-CM | POA: Insufficient documentation

## 2018-02-14 LAB — COMPREHENSIVE METABOLIC PANEL
ALT: 12 U/L (ref 0–44)
AST: 20 U/L (ref 15–41)
Albumin: 3.5 g/dL (ref 3.5–5.0)
Alkaline Phosphatase: 75 U/L (ref 38–126)
Anion gap: 10 (ref 5–15)
BUN: 12 mg/dL (ref 6–20)
CO2: 27 mmol/L (ref 22–32)
Calcium: 8.8 mg/dL — ABNORMAL LOW (ref 8.9–10.3)
Chloride: 100 mmol/L (ref 98–111)
Creatinine, Ser: 0.83 mg/dL (ref 0.44–1.00)
GFR calc Af Amer: >60 mL/min (ref >60)
GFR calc non Af Amer: >60 mL/min (ref >60)
Glucose, Bld: 93 mg/dL (ref 70–99)
Potassium: 4 mmol/L (ref 3.5–5.1)
Sodium: 137 mmol/L (ref 135–145)
Total Bilirubin: 0.4 mg/dL (ref 0.3–1.2)
Total Protein: 7.4 g/dL (ref 6.5–8.1)

## 2018-02-14 LAB — I-STAT BETA HCG BLOOD, ED (MC, WL, AP ONLY): I-stat hCG, quantitative: <5 m[IU]/mL (ref <5)

## 2018-02-14 LAB — CBC
HCT: 24.5 % — ABNORMAL LOW (ref 36.0–46.0)
Hemoglobin: 6.4 g/dL — CL (ref 12.0–15.0)
MCH: 19.5 pg — ABNORMAL LOW (ref 26.0–34.0)
MCHC: 26.1 g/dL — ABNORMAL LOW (ref 30.0–36.0)
MCV: 74.5 fL — ABNORMAL LOW (ref 78.0–100.0)
Platelets: 360 10*3/uL (ref 150–400)
RBC: 3.29 MIL/uL — ABNORMAL LOW (ref 3.87–5.11)
RDW: 19.4 % — ABNORMAL HIGH (ref 11.5–15.5)
WBC: 6.2 10*3/uL (ref 4.0–10.5)

## 2018-02-14 LAB — PREPARE RBC (CROSSMATCH)

## 2018-02-14 LAB — POC OCCULT BLOOD, ED: Fecal Occult Bld: POSITIVE — AB

## 2018-02-14 MED ORDER — SODIUM CHLORIDE 0.9% IV SOLUTION: Freq: Once | INTRAVENOUS | Status: DC

## 2018-02-14 NOTE — ED Triage Notes (Signed)
Pt sent from PCP with low Hgb; pt reporting some weakness and dizziness from same

## 2018-02-14 NOTE — ED Provider Notes (Signed)
[facility] EMERGENCY DEPARTMENT Provider Note   CSN: [id number] Arrival date & time: 02/14/18  1448     History   Chief Complaint Chief Complaint  Patient presents with  . Abnormal Lab  . Dizziness    HPI Emily Holland is a 57 y.o. female.  Patient is a 57 year old female with a history of severe iron deficiency anemia thought to be related to chronic GI bleeding however she has had extensive GI work-up in 2017 with colonoscopy, endoscopy and capsule endoscopy without identifying the cause of the bleeding.  Patient denies taking any NSAIDs, aspirin and does not drink alcohol.  She was last seen in January for the bleeding at that time received transfusion.  She has followed up with Dr. Elnoria Howard in the office but states they have still not identified the cause of the bleed.  She does take iron daily but only takes it once a day because of it causing constipation.  Patient states her stool is always dark and it does not seem to be any different today.  She has had no abdominal pain but over the last 2 weeks she started developing lightheadedness, exertional dyspnea and general fatigue.  She saw her PCP and had labs done today that showed her hemoglobin was dropping again and he recommended she come here for further care.  She denies any chest pain and no shortness of breath at rest.  The history is provided by the patient.    Past Medical History:  Diagnosis Date  . Arthritis    "bones; joints; legs; ankles" (03/17/2016)  . GERD (gastroesophageal reflux disease)   . Gout   . History of blood transfusion 06/2015; 08/2015; 03/17/2016  . History of iron deficiency anemia    took iron supplements 06/2015-3/ 2017; stopped/dr's order (03/17/2016)  . Iron deficiency anemia due to chronic blood loss 07/04/2017  . Low hemoglobin 06/2015; 08/2015; 03/17/2016  . Shortness of breath on exertion 06/03/2011  . Symptomatic anemia 06/2015; 08/2015; 03/17/2016    Patient Active  Problem List   Diagnosis Date Noted  . Iron deficiency anemia due to chronic blood loss 04/22/2016  . GERD (gastroesophageal reflux disease) 03/18/2016  . Anemia 03/17/2016  . Symptomatic anemia 03/17/2016  . Gout 08/17/2015  . Anemia, iron deficiency 08/17/2015  . GI bleeding 06/15/2015  . Acute blood loss anemia 06/11/2015  . Hypoxemia 06/03/2011  . Fever 06/03/2011  . Obesity 06/03/2011    Past Surgical History:  Procedure Laterality Date  . CERVICAL CONIZATION W/BX  08/2004  . COLONOSCOPY N/A 06/13/2015   Procedure: COLONOSCOPY;  Surgeon: Jeani Hawking, MD;  Location: [facility] ENDOSCOPY;  Service: Endoscopy;  Laterality: N/A;  . ESOPHAGOGASTRODUODENOSCOPY N/A 06/13/2015   Procedure: ESOPHAGOGASTRODUODENOSCOPY (EGD)/Colonoscopy;  Surgeon: Jeani Hawking, MD;  Location: [facility] ENDOSCOPY;  Service: Endoscopy;  Laterality: N/A;  . GIVENS CAPSULE STUDY N/A 06/14/2015   Procedure: GIVENS CAPSULE STUDY;  Surgeon: Jeani Hawking, MD;  Location: [facility] ENDOSCOPY;  Service: Endoscopy;  Laterality: N/A;  . GIVENS CAPSULE STUDY N/A 03/19/2016   Procedure: GIVENS CAPSULE STUDY;  Surgeon: Jeani Hawking, MD;  Location: [facility] ENDOSCOPY;  Service: Endoscopy;  Laterality: N/A;  . GIVENS CAPSULE STUDY N/A 07/05/2017   Procedure: GIVENS CAPSULE STUDY;  Surgeon: Jeani Hawking, MD;  Location: [facility] ENDOSCOPY;  Service: Endoscopy;  Laterality: N/A;  . VAGINAL HYSTERECTOMY  ~ 2005     OB History    Gravida  0   Para  0   Term  0   Preterm  0   AB  0   Living        SAB  0   TAB  0   Ectopic  0   Multiple      Live Births               Home Medications    Prior to Admission medications   Medication Sig Start Date End Date Taking? Authorizing Provider  allopurinol (ZYLOPRIM) 300 MG tablet Take 300 mg by mouth daily as needed (gout).  05/16/15   [provider]  calcium carbonate (TUMS - DOSED IN MG ELEMENTAL CALCIUM) 500 MG chewable tablet Chew 1 tablet by mouth daily as needed for indigestion or  heartburn.     [provider]  colchicine 0.6 MG tablet Take 0.6 mg by mouth 2 (two) times daily as needed (gout).     [provider]  Cyanocobalamin (VITAMIN B-12 PO) Take 1 tablet by mouth at bedtime.    [provider]  ferrous sulfate 325 (65 FE) MG tablet Take 1 tablet (325 mg total) by mouth 2 (two) times daily with a meal. Patient taking differently: Take 325 mg by mouth at bedtime.  03/20/16   Tyrone Nine, MD  GuaiFENesin (MUCINEX PO) Take 1 tablet by mouth daily as needed (cough).    [provider]  methocarbamol (ROBAXIN) 750 MG tablet Take 750 mg by mouth 2 (two) times daily as needed for muscle spasms.  05/16/15   [provider]  Olopatadine HCl 0.2 % SOLN Place 1 drop into both eyes daily as needed (for allergies).    [provider]  pantoprazole (PROTONIX) 40 MG tablet Take 1 tablet (40 mg total) by mouth daily. Patient taking differently: Take 40 mg by mouth daily as needed (acid reflux).  06/15/15   Leatha Gilding, MD  Phenylephrine-Pheniramine-DM [facility] COLD & COUGH PO) Take 1 packet by mouth daily as needed (cough).    [provider]  polyethylene glycol (MIRALAX / GLYCOLAX) packet Take 17 g by mouth daily as needed for mild constipation or moderate constipation. 07/06/17   Briant Cedar, MD  Pseudoeph-Doxylamine-DM-APAP (NYQUIL PO) Take 5 mLs by mouth at bedtime as needed (cough).    [provider]  senna-docusate (SENOKOT-S) 8.6-50 MG tablet Take 2 tablets by mouth daily as needed for mild constipation. Patient not taking: Reported on 07/09/2017 07/06/17 07/06/18  Briant Cedar, MD  traMADol (ULTRAM) 50 MG tablet Take 1 tablet (50 mg total) by mouth every 6 (six) hours as needed. Patient taking differently: Take 50 mg by mouth every 6 (six) hours as needed (for pain).  06/15/15   Leatha Gilding, MD    Family History Family History  Problem Relation Age of Onset  . COPD Other      Social History Social History   Tobacco Use  . Smoking status: Never Smoker  . Smokeless tobacco: Never Used  Substance Use Topics  . Alcohol use: No    Comment: Occassional  . Drug use: No     Allergies   Feraheme [ferumoxytol]   Review of Systems Review of Systems  All other systems reviewed and are negative.    Physical Exam Updated Vital Signs BP 134/70   Pulse 65   Temp 98.3 F (36.8 C) (Oral)   Resp 18   Ht 5\' 7"  (1.702 m)   Wt 125.2 kg   SpO2 100%   BMI 43.23 kg/m   Physical Exam  Constitutional:  She is oriented to person, place, and time. She appears well-developed and well-nourished. No distress.  HENT:  Head: Normocephalic and atraumatic.  Mouth/Throat: Oropharynx is clear and moist.  Eyes: Pupils are equal, round, and reactive to light. EOM are normal.  Pale conjunctive a  Neck: Normal range of motion. Neck supple.  Cardiovascular: Normal rate, regular rhythm and intact distal pulses.  No murmur heard. Pulmonary/Chest: Effort normal and breath sounds normal. No respiratory distress. She has no wheezes. She has no rales.  Abdominal: Soft. She exhibits no distension. There is no tenderness. There is no rebound and no guarding.  Genitourinary: Rectal exam shows guaiac positive stool.  Musculoskeletal: Normal range of motion. She exhibits no edema or tenderness.  Neurological: She is alert and oriented to person, place, and time.  Skin: Skin is warm and dry. No rash noted. No erythema.  Psychiatric: She has a normal mood and affect. Her behavior is normal.  Nursing note and vitals reviewed.    ED Treatments / Results  Labs (all labs ordered are listed, but only abnormal results are displayed) Labs Reviewed  COMPREHENSIVE METABOLIC PANEL - Abnormal; Notable for the following components:      Result Value   Calcium 8.8 (*)    All other components within normal limits  CBC - Abnormal; Notable for the following components:   RBC 3.29 (*)     Hemoglobin 6.4 (*)    HCT 24.5 (*)    MCV 74.5 (*)    MCH 19.5 (*)    MCHC 26.1 (*)    RDW 19.4 (*)    All other components within normal limits  POC OCCULT BLOOD, ED - Abnormal; Notable for the following components:   Fecal Occult Bld POSITIVE (*)    All other components within normal limits  VITAMIN B12  FOLATE  IRON AND TIBC  FERRITIN  RETICULOCYTES  I-STAT BETA HCG BLOOD, ED ([facility], [facility], [facility] ONLY)  POC OCCULT BLOOD, ED  TYPE AND SCREEN  PREPARE RBC (CROSSMATCH)    EKG None  Radiology No results found.  Procedures Procedures (including critical care time)  Medications Ordered in ED Medications  0.9 %  sodium chloride infusion (Manually program via Guardrails IV Fluids) (has no administration in time range)     Initial Impression / Assessment and Plan / ED Course  I have reviewed the triage vital signs and the nursing notes.  Pertinent labs & imaging results that were available during my care of the patient were reviewed by me and considered in my medical decision making (see chart for details).     Patient presenting with recurrent anemia today.  Patient is having extensive GI work-up without the source identified.  Patient is heme positive today and hemoglobin is down to 6.  Patient states in June or July she saw her PCP and at that time hemoglobin was 9.  Patient was last transfused in January for similar symptoms.  She is currently hemodynamically stable but is symptomatic with exertion.  She does take iron every day but only once a day due to it causing constipation.  She has no abdominal pain.  Low suspicion for acute cardiac abnormality.  Anemia panel was sent.  Will consult GI.  Will admit for blood transfusion.  CRITICAL CARE Performed by: Zalayah Pizzuto Total critical care time: 30 minutes Critical care time was exclusive of separately billable procedures and treating other patients. Critical care was necessary to treat or prevent imminent or life-threatening  deterioration. Critical care  was time spent personally by me on the following activities: development of treatment plan with patient and/or surrogate as well as nursing, discussions with consultants, evaluation of patient's response to treatment, examination of patient, obtaining history from patient or surrogate, ordering and performing treatments and interventions, ordering and review of laboratory studies, ordering and review of radiographic studies, pulse oximetry and re-evaluation of patient's condition.   Final Clinical Impressions(s) / ED Diagnoses   Final diagnoses:  Symptomatic anemia  Gastrointestinal hemorrhage, unspecified gastrointestinal hemorrhage type    ED Discharge Orders    None       Gwyneth Sprout, MD 02/14/18 2234

## 2018-02-15 ENCOUNTER — Other Ambulatory Visit: Payer: Self-pay

## 2018-02-15 ENCOUNTER — Observation Stay (HOSPITAL_COMMUNITY): Payer: 59

## 2018-02-15 ENCOUNTER — Encounter (HOSPITAL_COMMUNITY)
Admission: EM | Disposition: A | Discharge status: Home / Self Care | Payer: Self-pay | Source: Home / Self Care | Attending: Emergency Medicine

## 2018-02-15 ENCOUNTER — Encounter (HOSPITAL_COMMUNITY): Payer: Self-pay

## 2018-02-15 DIAGNOSIS — D649 Anemia, unspecified: Secondary | ICD-10-CM | POA: Diagnosis not present

## 2018-02-15 DIAGNOSIS — M109 Gout, unspecified: Secondary | ICD-10-CM | POA: Diagnosis not present

## 2018-02-15 DIAGNOSIS — Z23 Encounter for immunization: Secondary | ICD-10-CM | POA: Diagnosis not present

## 2018-02-15 DIAGNOSIS — K449 Diaphragmatic hernia without obstruction or gangrene: Secondary | ICD-10-CM | POA: Diagnosis not present

## 2018-02-15 DIAGNOSIS — E669 Obesity, unspecified: Secondary | ICD-10-CM | POA: Diagnosis not present

## 2018-02-15 DIAGNOSIS — K922 Gastrointestinal hemorrhage, unspecified: Secondary | ICD-10-CM | POA: Diagnosis not present

## 2018-02-15 DIAGNOSIS — D62 Acute posthemorrhagic anemia: Secondary | ICD-10-CM | POA: Diagnosis not present

## 2018-02-15 DIAGNOSIS — Z6841 Body Mass Index (BMI) 40.0 and over, adult: Secondary | ICD-10-CM | POA: Diagnosis not present

## 2018-02-15 DIAGNOSIS — K219 Gastro-esophageal reflux disease without esophagitis: Secondary | ICD-10-CM | POA: Diagnosis not present

## 2018-02-15 DIAGNOSIS — R195 Other fecal abnormalities: Secondary | ICD-10-CM | POA: Diagnosis not present

## 2018-02-15 DIAGNOSIS — D500 Iron deficiency anemia secondary to blood loss (chronic): Secondary | ICD-10-CM | POA: Diagnosis not present

## 2018-02-15 DIAGNOSIS — K264 Chronic or unspecified duodenal ulcer with hemorrhage: Secondary | ICD-10-CM | POA: Diagnosis not present

## 2018-02-15 DIAGNOSIS — K5900 Constipation, unspecified: Secondary | ICD-10-CM | POA: Diagnosis not present

## 2018-02-15 DIAGNOSIS — D509 Iron deficiency anemia, unspecified: Secondary | ICD-10-CM | POA: Diagnosis not present

## 2018-02-15 DIAGNOSIS — R0600 Dyspnea, unspecified: Secondary | ICD-10-CM | POA: Diagnosis not present

## 2018-02-15 DIAGNOSIS — R5383 Other fatigue: Secondary | ICD-10-CM | POA: Diagnosis not present

## 2018-02-15 HISTORY — PX: GIVENS CAPSULE STUDY: SHX5432

## 2018-02-15 LAB — VITAMIN B12: Vitamin B-12: 813 pg/mL (ref 180–914)

## 2018-02-15 LAB — MRSA PCR SCREENING: MRSA by PCR: NEGATIVE

## 2018-02-15 LAB — CBC
HCT: 28.7 % — ABNORMAL LOW (ref 36.0–46.0)
Hemoglobin: 8.1 g/dL — ABNORMAL LOW (ref 12.0–15.0)
MCH: 21.5 pg — ABNORMAL LOW (ref 26.0–34.0)
MCHC: 28.2 g/dL — ABNORMAL LOW (ref 30.0–36.0)
MCV: 76.1 fL — ABNORMAL LOW (ref 78.0–100.0)
Platelets: 313 10*3/uL (ref 150–400)
RBC: 3.77 MIL/uL — ABNORMAL LOW (ref 3.87–5.11)
RDW: 19.4 % — ABNORMAL HIGH (ref 11.5–15.5)
WBC: 5.9 10*3/uL (ref 4.0–10.5)

## 2018-02-15 LAB — BASIC METABOLIC PANEL
Anion gap: 7 (ref 5–15)
BUN: 9 mg/dL (ref 6–20)
CO2: 30 mmol/L (ref 22–32)
Calcium: 8.8 mg/dL — ABNORMAL LOW (ref 8.9–10.3)
Chloride: 102 mmol/L (ref 98–111)
Creatinine, Ser: 0.76 mg/dL (ref 0.44–1.00)
GFR calc Af Amer: >60 mL/min (ref >60)
GFR calc non Af Amer: >60 mL/min (ref >60)
Glucose, Bld: 84 mg/dL (ref 70–99)
Potassium: 4.3 mmol/L (ref 3.5–5.1)
Sodium: 139 mmol/L (ref 135–145)

## 2018-02-15 LAB — RETICULOCYTES
RBC.: 3.23 MIL/uL — ABNORMAL LOW (ref 3.87–5.11)
Retic Count, Absolute: 29.1 10*3/uL (ref 19.0–186.0)
Retic Ct Pct: 0.9 % (ref 0.4–3.1)

## 2018-02-15 LAB — FOLATE: Folate: 22.2 ng/mL (ref >5.9)

## 2018-02-15 LAB — IRON AND TIBC
Iron: 10 ug/dL — ABNORMAL LOW (ref 28–170)
Saturation Ratios: 2 % — ABNORMAL LOW (ref 10.4–31.8)
TIBC: 521 ug/dL — ABNORMAL HIGH (ref 250–450)
UIBC: 511 ug/dL

## 2018-02-15 LAB — FERRITIN: Ferritin: 3 ng/mL — ABNORMAL LOW (ref 11–307)

## 2018-02-15 SURGERY — IMAGING PROCEDURE, GI TRACT, INTRALUMINAL, VIA CAPSULE
Anesthesia: LOCAL

## 2018-02-15 MED ORDER — SODIUM CHLORIDE 0.9 % IV SOLN: INTRAVENOUS | Status: DC

## 2018-02-15 MED ORDER — ACETAMINOPHEN 650 MG RE SUPP: 650.0000 mg | Freq: Four times a day (QID) | RECTAL | Status: DC | PRN

## 2018-02-15 MED ORDER — TRAMADOL HCL 50 MG PO TABS: 50.0000 mg | ORAL_TABLET | Freq: Four times a day (QID) | ORAL | Status: DC | PRN

## 2018-02-15 MED ORDER — ACETAMINOPHEN 325 MG PO TABS: 650.0000 mg | ORAL_TABLET | Freq: Four times a day (QID) | ORAL | Status: DC | PRN

## 2018-02-15 MED ORDER — INFLUENZA VAC SPLIT QUAD 0.5 ML IM SUSY
0.5000 mL | PREFILLED_SYRINGE | INTRAMUSCULAR | Status: AC
  Administered 2018-02-16: 0.5 mL via INTRAMUSCULAR
  Filled 2018-02-15: qty 0.5

## 2018-02-15 MED ORDER — SODIUM CHLORIDE 0.9 % IV SOLN
INTRAVENOUS | Status: AC
  Administered 2018-02-15 – 2018-02-16 (×2): via INTRAVENOUS

## 2018-02-15 MED ORDER — FAMOTIDINE IN NACL 20-0.9 MG/50ML-% IV SOLN
20.0000 mg | Freq: Two times a day (BID) | INTRAVENOUS | Status: DC
  Administered 2018-02-15 – 2018-02-16 (×3): 20 mg via INTRAVENOUS
  Filled 2018-02-15 (×3): qty 50

## 2018-02-15 MED ORDER — ONDANSETRON HCL 4 MG/2ML IJ SOLN: 4.0000 mg | Freq: Four times a day (QID) | INTRAMUSCULAR | Status: DC | PRN

## 2018-02-15 MED ORDER — ONDANSETRON HCL 4 MG PO TABS: 4.0000 mg | ORAL_TABLET | Freq: Four times a day (QID) | ORAL | Status: DC | PRN

## 2018-02-15 SURGICAL SUPPLY — 1 items: TOWEL COTTON PACK 4EA (MISCELLANEOUS) ×4 IMPLANT

## 2018-02-15 NOTE — Progress Notes (Signed)
Patient having capsule study administered by gi.  Materials for diet progression and lead placement placed in patients room at bedside.

## 2018-02-15 NOTE — Progress Notes (Signed)
[facility] PROGRESS NOTE  Emily Holland ZOX:[id number] DOB: 1961-05-01 DOA: 02/14/2018 PCP: Fleet Contras, MD  Brief summary   57 y.o. female with history of gout and has had GI bleed source was not clear has had extensive work-up done in 2017 and also had a repeat capsule endoscopy in January 2019 which did not show any source for GI bleeding has been experiencing exertional shortness of breath for last 2 weeks.  Denies any chest pain productive cough fever or chills denies any nausea vomiting or any obvious GI bleed.  Denies taking any aspirin Goody powder NSAIDs.  ED Course: In the ER patient hemoglobin was around 6.4 stool for blood was positive.  Patient was ordered 2 units of PRBC and admitted for further management.  Assessment/Plan:  GI blood loss. Remains unclear source/location.  patient has had capsule endoscopy in January of this year and also in 2017 also had colonoscopy in 2017. EGD: duodenal erosions.  -Hg is 6.4 on admission. Repeat Hg is 8.1 after blood transfusion. Will monitor h/h. TF as needed. Consulted  Dr. Elnoria Howard gastroenterologist if needs repeat egd.   Symptomatic anemia, severe iron deficiency, chronic blood loss anemia. Transfused blood on admission. Will supplement with iv iron   History of gout with no acute exercise patient seen.  Code Status: full Family Communication: d/w patient, RN (indicate person spoken with, relationship, and if by phone, the number) Disposition Plan: home 24-48 hrs    Consultants:  GI  Procedures:  none  Antibiotics:  none (indicate start date, and stop date if known)  HPI/Subjective: Reports feeling mild dizzy. No acute bleeding episodes of overnight.   Objective: Vitals:   02/15/18 0534 02/15/18 0730  BP: 120/67 (!) 113/53  Pulse: 60 62  Resp: 15 15  Temp: 98 F (36.7 C) 98.5 F (36.9 C)  SpO2: 94% 95%    Intake/Output Summary (Last 24 hours) at 02/15/2018 1122 Last data filed at 02/15/2018  0730 Gross per 24 hour  Intake 654 ml  Output -  Net 654 ml   Filed Weights   02/14/18 1647  Weight: 125.2 kg    Exam:   General:  No distress   Cardiovascular: s1,s2 rrr  Respiratory: CTA BL   Abdomen: soft, nt   Musculoskeletal: no leg edema    Data Reviewed: Basic Metabolic Panel: Recent Labs  Lab 02/14/18 1656 02/15/18 1004  NA 137 139  K 4.0 4.3  CL 100 102  CO2 27 30  GLUCOSE 93 84  BUN 12 9  CREATININE 0.83 0.76  CALCIUM 8.8* 8.8*   Liver Function Tests: Recent Labs  Lab 02/14/18 1656  AST 20  ALT 12  ALKPHOS 75  BILITOT 0.4  PROT 7.4  ALBUMIN 3.5   No results for input(s): LIPASE, AMYLASE in the last 168 hours. No results for input(s): AMMONIA in the last 168 hours. CBC: Recent Labs  Lab 02/14/18 1656 02/15/18 1004  WBC 6.2 5.9  HGB 6.4* 8.1*  HCT 24.5* 28.7*  MCV 74.5* 76.1*  PLT 360 313   Cardiac Enzymes: No results for input(s): CKTOTAL, CKMB, CKMBINDEX, TROPONINI in the last 168 hours. BNP (last 3 results) No results for input(s): BNP in the last 8760 hours.  ProBNP (last 3 results) No results for input(s): PROBNP in the last 8760 hours.  CBG: No results for input(s): GLUCAP in the last 168 hours.  Recent Results (from the past 240 hour(s))  MRSA PCR Screening     Status: None  Collection Time: 02/15/18  5:49 AM  Result Value Ref Range Status   MRSA by PCR NEGATIVE NEGATIVE Final    Comment:        The GeneXpert MRSA Assay (FDA approved for NASAL specimens only), is one component of a comprehensive MRSA colonization surveillance program. It is not intended to diagnose MRSA infection nor to guide or monitor treatment for MRSA infections. Performed at [facility], [address] [place], [place] [address]      Studies: Dg Chest Port 1 View  Result Date: 02/15/2018 CLINICAL DATA:  Dyspnea EXAM: PORTABLE CHEST 1 VIEW COMPARISON:  07/04/2017 chest radiograph. FINDINGS: Stable cardiomediastinal silhouette  with mild cardiomegaly and large hiatal hernia. No pneumothorax. No pleural effusion. No overt pulmonary edema. No acute consolidative airspace disease. IMPRESSION: 1. Stable mild cardiomegaly without overt pulmonary edema. 2. Large hiatal hernia. Electronically Signed   By: Delbert Phenix M.D.   On: 02/15/2018 08:39    Scheduled Meds: . sodium chloride   Intravenous Once  . [START ON 02/16/2018] Influenza vac split quadrivalent PF  0.5 mL Intramuscular Tomorrow-1000   Continuous Infusions: . famotidine (PEPCID) IV      Principal Problem:   Symptomatic anemia Active Problems:   Acute blood loss anemia   GI bleeding    Time spent: >35 minutes     Esperanza Sheets  [facility] Pager [phone]. If 7PM-7AM, please contact night-coverage at [email or web address], password [facility] 02/15/2018, 11:22 AM  LOS: 0 days

## 2018-02-15 NOTE — Progress Notes (Signed)
   02/15/18 1200  Clinical Encounter Type  Visited With Patient  Visit Type Initial  Referral From Nurse  Consult/Referral To Chaplain  Spiritual Encounters  Spiritual Needs Prayer;Other (Comment) (confirmed ACPOA refresher)  Stress Factors  Patient Stress Factors Exhausted   Chaplain visited PTand noticed AD on table and offered an overview of it. Chaplain finished with prayer.

## 2018-02-15 NOTE — Consult Note (Signed)
Reason for Consult: Anemia, fatigue, melena Referring Physician: [facility]  Chloeanne Togba Cooksey HPI: This is a 57 year old female with a history of recurrent GI bleeding of unknown site, who presents with severe anemia.  Her symptoms start as fatigue and further work up showed that she was anemic.  This is her typical presentation for her IDA.  Extensive examination with an EGD/Colonoscopy, enteroscopy , and two capsule endoscopies were performed in 2017 without any overt site of bleeding.  She was followed up by Dr. Candise Che and provided with iron infusions.  It is not clear, but she has not been back for follow up.  In 06/2017 a similar presentation occurred and another capsule endoscopy was performed with negative results.  She reports having melena, but it is unclear when and for how long.  She is heme positive.  Past Medical History:  Diagnosis Date  . Arthritis    "bones; joints; legs; ankles" (03/17/2016)  . GERD (gastroesophageal reflux disease)   . Gout   . History of blood transfusion 06/2015; 08/2015; 03/17/2016  . History of iron deficiency anemia    took iron supplements 06/2015-3/ 2017; stopped/dr's order (03/17/2016)  . Iron deficiency anemia due to chronic blood loss 07/04/2017  . Low hemoglobin 06/2015; 08/2015; 03/17/2016  . Shortness of breath on exertion 06/03/2011  . Symptomatic anemia 06/2015; 08/2015; 03/17/2016    Past Surgical History:  Procedure Laterality Date  . CERVICAL CONIZATION W/BX  08/2004  . COLONOSCOPY N/A 06/13/2015   Procedure: COLONOSCOPY;  Surgeon: Jeani Hawking, MD;  Location[facility] ENDOSCOPY;  Service: Endoscopy;  Laterality: N/A;  . ESOPHAGOGASTRODUODENOSCOPY N/A 06/13/2015   Procedure: ESOPHAGOGASTRODUODENOSCOPY (EGD)/Colonoscopy;  Surgeon: Jeani Hawking, MD;  Location: [facility] ENDOSCOPY;  Service: Endoscopy;  Laterality: N/A;  . GIVENS CAPSULE STUDY N/A 06/14/2015   Procedure: GIVENS CAPSULE STUDY;  Surgeon: Jeani Hawking, MD;  Location: [facility] ENDOSCOPY;  Service:  Endoscopy;  Laterality: N/A;  . GIVENS CAPSULE STUDY N/A 03/19/2016   Procedure: GIVENS CAPSULE STUDY;  Surgeon: Jeani Hawking, MD;  Location: [facility] ENDOSCOPY;  Service: Endoscopy;  Laterality: N/A;  . GIVENS CAPSULE STUDY N/A 07/05/2017   Procedure: GIVENS CAPSULE STUDY;  Surgeon: Jeani Hawking, MD;  Location: [facility] ENDOSCOPY;  Service: Endoscopy;  Laterality: N/A;  . VAGINAL HYSTERECTOMY  ~ 2005    Family History  Problem Relation Age of Onset  . COPD Other     Social History:  reports that she has never smoked. She has never used smokeless tobacco. She reports that she does not drink alcohol or use drugs.  Allergies:  Allergies  Allergen Reactions  . Feraheme [Ferumoxytol] Rash    Developed on 3rd iron infusion in Jan.    Medications:  Scheduled: . sodium chloride   Intravenous Once  . [START ON 02/16/2018] Influenza vac split quadrivalent PF  0.5 mL Intramuscular Tomorrow-1000   Continuous: . sodium chloride 75 mL/hr at 02/15/18 1217  . famotidine (PEPCID) IV 20 mg (02/15/18 2146)    Results for orders placed or performed during the hospital encounter of 02/14/18 (from the past 24 hour(s))  Prepare RBC     Status: None   Collection Time: 02/14/18 10:23 PM  Result Value Ref Range   Order Confirmation      ORDER PROCESSED BY BLOOD BANK Performed at [facility], [address], [place], [place] [address]   POC occult blood, ED     Status: Abnormal   Collection Time: 02/14/18 10:24 PM  Result Value Ref Range  Fecal Occult Bld POSITIVE (A) NEGATIVE  Vitamin B12     Status: None   Collection Time: 02/15/18 12:15 AM  Result Value Ref Range   Vitamin B-12 813 180 - 914 pg/mL  Folate     Status: None   Collection Time: 02/15/18 12:15 AM  Result Value Ref Range   Folate 22.2 >5.9 ng/mL  Iron and TIBC     Status: Abnormal   Collection Time: 02/15/18 12:15 AM  Result Value Ref Range   Iron 10 (L) 28 - 170 ug/dL   TIBC 010 (H) 272 - 536 ug/dL   Saturation Ratios 2 (L)  10.4 - 31.8 %   UIBC 511 ug/dL  Ferritin     Status: Abnormal   Collection Time: 02/15/18 12:15 AM  Result Value Ref Range   Ferritin 3 (L) 11 - 307 ng/mL  Reticulocytes     Status: Abnormal   Collection Time: 02/15/18 12:15 AM  Result Value Ref Range   Retic Ct Pct 0.9 0.4 - 3.1 %   RBC. 3.23 (L) 3.87 - 5.11 MIL/uL   Retic Count, Absolute 29.1 19.0 - 186.0 K/uL  MRSA PCR Screening     Status: None   Collection Time: 02/15/18  5:49 AM  Result Value Ref Range   MRSA by PCR NEGATIVE NEGATIVE  Basic metabolic panel     Status: Abnormal   Collection Time: 02/15/18 10:04 AM  Result Value Ref Range   Sodium 139 135 - 145 mmol/L   Potassium 4.3 3.5 - 5.1 mmol/L   Chloride 102 98 - 111 mmol/L   CO2 30 22 - 32 mmol/L   Glucose, Bld 84 70 - 99 mg/dL   BUN 9 6 - 20 mg/dL   Creatinine, Ser 6.44 0.44 - 1.00 mg/dL   Calcium 8.8 (L) 8.9 - 10.3 mg/dL   GFR calc non Af Amer >60 >60 mL/min   GFR calc Af Amer >60 >60 mL/min   Anion gap 7 5 - 15  CBC     Status: Abnormal   Collection Time: 02/15/18 10:04 AM  Result Value Ref Range   WBC 5.9 4.0 - 10.5 K/uL   RBC 3.77 (L) 3.87 - 5.11 MIL/uL   Hemoglobin 8.1 (L) 12.0 - 15.0 g/dL   HCT 03.4 (L) 74.2 - 59.5 %   MCV 76.1 (L) 78.0 - 100.0 fL   MCH 21.5 (L) 26.0 - 34.0 pg   MCHC 28.2 (L) 30.0 - 36.0 g/dL   RDW 63.8 (H) 75.6 - 43.3 %   Platelets 313 150 - 400 K/uL     Dg Chest Port 1 View  Result Date: 02/15/2018 CLINICAL DATA:  Dyspnea EXAM: PORTABLE CHEST 1 VIEW COMPARISON:  07/04/2017 chest radiograph. FINDINGS: Stable cardiomediastinal silhouette with mild cardiomegaly and large hiatal hernia. No pneumothorax. No pleural effusion. No overt pulmonary edema. No acute consolidative airspace disease. IMPRESSION: 1. Stable mild cardiomegaly without overt pulmonary edema. 2. Large hiatal hernia. Electronically Signed   By: Delbert Phenix M.D.   On: 02/15/2018 08:39    ROS:  As stated above in the HPI otherwise negative.  Blood pressure 92/70,  pulse 72, temperature 98.2 F (36.8 C), temperature source Oral, resp. rate 18, height 5\' 7"  (1.702 m), weight 125.2 kg, SpO2 96 %.    PE: Gen: NAD, Alert and Oriented HEENT:  [place]/AT, EOMI Neck: Supple, no LAD Lungs: CTA Bilaterally CV: RRR without M/G/R ABM: Soft, NTND, +BS Ext: No C/C/E  Assessment/Plan: 1) Recurrent IDA. 2) Presumed  small bowel source of bleeding.   Options for evaluation are limited, but the capsule endoscopy is the best test as there is a high suspicion of a small bowel source of bleeding.  If the work up is negative, again, then she will need to have routine follow up with hematology to avoid severe anemia from developing.  Plan: 1) Repeat the capsule endoscopy.  Mizraim Harmening D 02/15/2018, 10:13 PM

## 2018-02-15 NOTE — H&P (Signed)
History and Physical    Emily Holland AUQ:[id number] DOB: 1961/04/19 DOA: 02/14/2018  PCP: Fleet Contras, MD  Patient coming from: Home.  Chief Complaint: Shortness of breath.  HPI: Emily Holland is a 57 y.o. female with history of gout and has had GI bleed source was not clear has had extensive work-up done in 2017 and also had a repeat capsule endoscopy in January 2019 which did not show any source for GI bleeding has been experiencing exertional shortness of breath for last 2 weeks.  Denies any chest pain productive cough fever or chills denies any nausea vomiting or any obvious GI bleed.  Denies taking any aspirin Goody powder NSAIDs.  ED Course: In the ER patient hemoglobin was around 6.4 stool for blood was positive.  Patient was ordered 2 units of PRBC and admitted for further management.  Review of Systems: As per HPI, rest all negative.   Past Medical History:  Diagnosis Date  . Arthritis    "bones; joints; legs; ankles" (03/17/2016)  . GERD (gastroesophageal reflux disease)   . Gout   . History of blood transfusion 06/2015; 08/2015; 03/17/2016  . History of iron deficiency anemia    took iron supplements 06/2015-3/ 2017; stopped/dr's order (03/17/2016)  . Iron deficiency anemia due to chronic blood loss 07/04/2017  . Low hemoglobin 06/2015; 08/2015; 03/17/2016  . Shortness of breath on exertion 06/03/2011  . Symptomatic anemia 06/2015; 08/2015; 03/17/2016    Past Surgical History:  Procedure Laterality Date  . CERVICAL CONIZATION W/BX  08/2004  . COLONOSCOPY N/A 06/13/2015   Procedure: COLONOSCOPY;  Surgeon: Jeani Hawking, MD;  Location: [facility];  Service: Endoscopy;  Laterality: N/A;  . ESOPHAGOGASTRODUODENOSCOPY N/A 06/13/2015   Procedure: ESOPHAGOGASTRODUODENOSCOPY (EGD)/Colonoscopy;  Surgeon: Jeani Hawking, MD;  Location: [facility];  Service: Endoscopy;  Laterality: N/A;  . GIVENS CAPSULE STUDY N/A 06/14/2015   Procedure: GIVENS CAPSULE STUDY;  Surgeon:  Jeani Hawking, MD;  Location: [facility];  Service: Endoscopy;  Laterality: N/A;  . GIVENS CAPSULE STUDY N/A 03/19/2016   Procedure: GIVENS CAPSULE STUDY;  Surgeon: Jeani Hawking, MD;  Location: [facility] ENDOSCOPY;  Service: Endoscopy;  Laterality: N/A;  . GIVENS CAPSULE STUDY N/A 07/05/2017   Procedure: GIVENS CAPSULE STUDY;  Surgeon: Jeani Hawking, MD;  Location: [facility] ENDOSCOPY;  Service: Endoscopy;  Laterality: N/A;  . VAGINAL HYSTERECTOMY  ~ 2005     reports that she has never smoked. She has never used smokeless tobacco. She reports that she does not drink alcohol or use drugs.  Allergies  Allergen Reactions  . Feraheme [Ferumoxytol] Rash    Developed on 3rd iron infusion in Jan.    Family History  Problem Relation Age of Onset  . COPD Other     Prior to Admission medications   Medication Sig Start Date End Date Taking? Authorizing Provider  allopurinol (ZYLOPRIM) 300 MG tablet Take 300 mg by mouth daily as needed (gout).  05/16/15   [provider]  calcium carbonate (TUMS - DOSED IN MG ELEMENTAL CALCIUM) 500 MG chewable tablet Chew 1 tablet by mouth daily as needed for indigestion or heartburn.     [provider]  colchicine 0.6 MG tablet Take 0.6 mg by mouth 2 (two) times daily as needed (gout).     [provider]  Cyanocobalamin (VITAMIN B-12 PO) Take 1 tablet by mouth at bedtime.    [provider]  ferrous sulfate 325 (65 FE) MG tablet Take 1 tablet (325 mg total) by mouth 2 (  two) times daily with a meal. Patient taking differently: Take 325 mg by mouth at bedtime.  03/20/16   Tyrone Nine, MD  GuaiFENesin (MUCINEX PO) Take 1 tablet by mouth daily as needed (cough).    [provider]  methocarbamol (ROBAXIN) 750 MG tablet Take 750 mg by mouth 2 (two) times daily as needed for muscle spasms.  05/16/15   [provider]  Olopatadine HCl 0.2 % SOLN Place 1 drop into both eyes daily as needed (for allergies).    [provider]  pantoprazole (PROTONIX) 40 MG tablet Take 1 tablet (40 mg total) by mouth daily. Patient taking differently: Take 40 mg by mouth daily as needed (for acid reflux).  06/15/15   Leatha Gilding, MD  Phenylephrine-Pheniramine-DM [facility] COLD & COUGH PO) Take 1 packet by mouth daily as needed (cough).    [provider]  polyethylene glycol (MIRALAX / GLYCOLAX) packet Take 17 g by mouth daily as needed for mild constipation or moderate constipation. 07/06/17   Briant Cedar, MD  Pseudoeph-Doxylamine-DM-APAP (NYQUIL PO) Take 5 mLs by mouth at bedtime as needed (cough).    [provider]  senna-docusate (SENOKOT-S) 8.6-50 MG tablet Take 2 tablets by mouth daily as needed for mild constipation. 07/06/17 07/06/18  Briant Cedar, MD  traMADol (ULTRAM) 50 MG tablet Take 1 tablet (50 mg total) by mouth every 6 (six) hours as needed. Patient taking differently: Take 50 mg by mouth every 6 (six) hours as needed (for pain).  06/15/15   Leatha Gilding, MD    Physical Exam: Vitals:   02/14/18 2300 02/14/18 2315 02/14/18 2330 02/14/18 2357  BP: 126/71 (!) 118/44 117/61 138/87  Pulse: 66 (!) 59 62 68  Resp:    16  Temp:    98.2 F (36.8 C)  TempSrc:    Oral  SpO2: 99% 98% 100% 100%  Weight:      Height:          Constitutional: Moderately built and nourished. Vitals:   02/14/18 2300 02/14/18 2315 02/14/18 2330 02/14/18 2357  BP: 126/71 (!) 118/44 117/61 138/87  Pulse: 66 (!) 59 62 68  Resp:    16  Temp:    98.2 F (36.8 C)  TempSrc:    Oral  SpO2: 99% 98% 100% 100%  Weight:      Height:       Eyes: Anicteric mild pallor. ENMT: No discharge from the ears eyes nose or mouth. Neck: No mass felt.  No neck rigidity. Respiratory: No rhonchi or crepitations. Cardiovascular: S1-S2 heard no murmurs appreciated. Abdomen: Soft nontender bowel sounds present. Musculoskeletal: No edema.  No joint effusion. Skin: No rash. Neurologic: Alert awake  oriented to time place and person.  Moves all extremity's. Psychiatric: Appears normal per normal affect.   Labs on Admission: I have personally reviewed following labs and imaging studies  CBC: Recent Labs  Lab 02/14/18 1656  WBC 6.2  HGB 6.4*  HCT 24.5*  MCV 74.5*  PLT 360   Basic Metabolic Panel: Recent Labs  Lab 02/14/18 1656  NA 137  K 4.0  CL 100  CO2 27  GLUCOSE 93  BUN 12  CREATININE 0.83  CALCIUM 8.8*   GFR: Estimated Creatinine Clearance: 101.5 mL/min (by C-G formula based on SCr of 0.83 mg/dL). Liver Function Tests: Recent Labs  Lab 02/14/18 1656  AST 20  ALT 12  ALKPHOS 75  BILITOT 0.4  PROT 7.4  ALBUMIN 3.5  No results for input(s): LIPASE, AMYLASE in the last 168 hours. No results for input(s): AMMONIA in the last 168 hours. Coagulation Profile: No results for input(s): INR, PROTIME in the last 168 hours. Cardiac Enzymes: No results for input(s): CKTOTAL, CKMB, CKMBINDEX, TROPONINI in the last 168 hours. BNP (last 3 results) No results for input(s): PROBNP in the last 8760 hours. HbA1C: No results for input(s): HGBA1C in the last 72 hours. CBG: No results for input(s): GLUCAP in the last 168 hours. Lipid Profile: No results for input(s): CHOL, HDL, LDLCALC, TRIG, CHOLHDL, LDLDIRECT in the last 72 hours. Thyroid Function Tests: No results for input(s): TSH, T4TOTAL, FREET4, T3FREE, THYROIDAB in the last 72 hours. Anemia Panel: No results for input(s): VITAMINB12, FOLATE, FERRITIN, TIBC, IRON, RETICCTPCT in the last 72 hours. Urine analysis:    Component Value Date/Time   COLORURINE YELLOW 08/16/2015 2141   APPEARANCEUR CLOUDY (A) 08/16/2015 2141   LABSPEC 1.019 08/16/2015 2141   PHURINE 7.0 08/16/2015 2141   GLUCOSEU NEGATIVE 08/16/2015 2141   HGBUR NEGATIVE 08/16/2015 2141   BILIRUBINUR NEGATIVE 08/16/2015 2141   KETONESUR NEGATIVE 08/16/2015 2141   PROTEINUR NEGATIVE 08/16/2015 2141   NITRITE NEGATIVE 08/16/2015 2141    LEUKOCYTESUR NEGATIVE 08/16/2015 2141   Sepsis Labs: @LABRCNTIP (procalcitonin:4,lacticidven:4) )No results found for this or any previous visit (from the past 240 hour(s)).   Radiological Exams on Admission: No results found.    Assessment/Plan Principal Problem:   Symptomatic anemia Active Problems:   Acute blood loss anemia   GI bleeding    1. Symptomatic anemia secondary to chronic GI blood loss -patient has had capsule endoscopy in January of this year and also in 2017 also had colonoscopy in 2017.  Please consult Dr. Elnoria Howard gastroenterologist in the morning. 2. Acute blood loss anemia follow anemia panel.  Patient is on iron supplements and B12 supplements. 3. History of gout with no acute exercise patient seen.   DVT prophylaxis: SCDs. Code Status: Full code. Family Communication: Discussed with patient. Disposition Plan: Home. Consults called: None. Admission status: Observation.   Eduard Clos MD [facility] Pager [phone].  If 7PM-7AM, please contact night-coverage [email or web address] Password [email or web address]  02/15/2018, 12:02 AM

## 2018-02-15 NOTE — Plan of Care (Signed)

## 2018-02-16 ENCOUNTER — Encounter (HOSPITAL_COMMUNITY): Payer: Self-pay | Admitting: Gastroenterology

## 2018-02-16 DIAGNOSIS — K264 Chronic or unspecified duodenal ulcer with hemorrhage: Secondary | ICD-10-CM | POA: Diagnosis not present

## 2018-02-16 DIAGNOSIS — D62 Acute posthemorrhagic anemia: Secondary | ICD-10-CM

## 2018-02-16 DIAGNOSIS — E669 Obesity, unspecified: Secondary | ICD-10-CM | POA: Diagnosis not present

## 2018-02-16 DIAGNOSIS — K449 Diaphragmatic hernia without obstruction or gangrene: Secondary | ICD-10-CM | POA: Diagnosis not present

## 2018-02-16 DIAGNOSIS — D509 Iron deficiency anemia, unspecified: Secondary | ICD-10-CM | POA: Diagnosis not present

## 2018-02-16 DIAGNOSIS — K922 Gastrointestinal hemorrhage, unspecified: Secondary | ICD-10-CM | POA: Diagnosis not present

## 2018-02-16 DIAGNOSIS — R195 Other fecal abnormalities: Secondary | ICD-10-CM | POA: Diagnosis not present

## 2018-02-16 DIAGNOSIS — D500 Iron deficiency anemia secondary to blood loss (chronic): Secondary | ICD-10-CM | POA: Diagnosis not present

## 2018-02-16 DIAGNOSIS — M109 Gout, unspecified: Secondary | ICD-10-CM | POA: Diagnosis not present

## 2018-02-16 DIAGNOSIS — K219 Gastro-esophageal reflux disease without esophagitis: Secondary | ICD-10-CM | POA: Diagnosis not present

## 2018-02-16 DIAGNOSIS — Z6841 Body Mass Index (BMI) 40.0 and over, adult: Secondary | ICD-10-CM | POA: Diagnosis not present

## 2018-02-16 DIAGNOSIS — K5900 Constipation, unspecified: Secondary | ICD-10-CM | POA: Diagnosis not present

## 2018-02-16 DIAGNOSIS — D649 Anemia, unspecified: Secondary | ICD-10-CM | POA: Diagnosis not present

## 2018-02-16 DIAGNOSIS — R5383 Other fatigue: Secondary | ICD-10-CM | POA: Diagnosis not present

## 2018-02-16 DIAGNOSIS — Z23 Encounter for immunization: Secondary | ICD-10-CM | POA: Diagnosis not present

## 2018-02-16 LAB — CBC
HCT: 28.2 % — ABNORMAL LOW (ref 36.0–46.0)
Hemoglobin: 8 g/dL — ABNORMAL LOW (ref 12.0–15.0)
MCH: 21.7 pg — ABNORMAL LOW (ref 26.0–34.0)
MCHC: 28.4 g/dL — ABNORMAL LOW (ref 30.0–36.0)
MCV: 76.4 fL — ABNORMAL LOW (ref 78.0–100.0)
Platelets: 307 10*3/uL (ref 150–400)
RBC: 3.69 MIL/uL — ABNORMAL LOW (ref 3.87–5.11)
RDW: 19.4 % — ABNORMAL HIGH (ref 11.5–15.5)
WBC: 6.1 10*3/uL (ref 4.0–10.5)

## 2018-02-16 LAB — TYPE AND SCREEN
ABO/RH(D): B POS
Antibody Screen: NEGATIVE
Unit division: 0
Unit division: 0

## 2018-02-16 LAB — BPAM RBC
Blood Product Expiration Date: 201909272359
Blood Product Expiration Date: 201909272359
ISSUE DATE / TIME: 201909100149
ISSUE DATE / TIME: 201909100507
Unit Type and Rh: 7300
Unit Type and Rh: 7300

## 2018-02-16 MED ORDER — PANTOPRAZOLE SODIUM 40 MG PO TBEC: 40.0000 mg | DELAYED_RELEASE_TABLET | Freq: Two times a day (BID) | ORAL | Status: DC

## 2018-02-16 MED ORDER — SODIUM CHLORIDE 0.9 % IV SOLN
125.0000 mg | Freq: Once | INTRAVENOUS | Status: AC
  Administered 2018-02-16: 125 mg via INTRAVENOUS
  Filled 2018-02-16: qty 10

## 2018-02-16 MED ORDER — PANTOPRAZOLE SODIUM 40 MG PO TBEC: 40.0000 mg | DELAYED_RELEASE_TABLET | Freq: Two times a day (BID) | ORAL | 0 refills | Status: DC

## 2018-02-16 NOTE — Progress Notes (Signed)
PROGRESS NOTE        PATIENT DETAILS Name: Emily Holland Age: 57 y.o. Sex: female Date of Birth: 12/22/1960 Admit Date: 02/14/2018 Admitting Physician Eduard Clos, MD [id number], Dorma Russell, MD  Brief Narrative: Patient is a 57 y.o. female with prior history of gout,GI bleed in 2017 without unclear etiology in spite of extensive work-up-presenting with symptomatic anemia and intermittent black-colored stools (on iron supplementation).  Hemoglobin on admission was 6.4, patient was transfused 2 units of PRBC and admitted to the hospitalist service.  See below for further details.  Subjective: No chest pain or shortness of breath.  Lying comfortably in bed.  No melena this morning.  No hematochezia.  Assessment/Plan: GI bleeding: Foci of GI loss remains unclear, had an extensive work-up 2017 that was negative, awaiting results of capsule endoscopy.  Await further recommendations from GI.  Subacute loss anemia in the setting of GI bleeding/chronic iron deficiency: Hemoglobin stable after 2 units of PRBC transfusion, spoke with pharmacy-IV iron x1 today.  Gout: No evidence of flare.  DVT Prophylaxis: SCD's  Code Status: Full code   Family Communication: None at bedside  Disposition Plan: Remain inpatient-home once cleared by gastroenterology.  Antimicrobial agents: Anti-infectives (From admission, onward)   None      Procedures: 9/10>> capsule endoscopy  CONSULTS:  GI  Time spent: 25- minutes-Greater than 50% of this time was spent in counseling, explanation of diagnosis, planning of further management, and coordination of care.  MEDICATIONS: Scheduled Meds: . sodium chloride   Intravenous Once  . Influenza vac split quadrivalent PF  0.5 mL Intramuscular Tomorrow-1000   Continuous Infusions: . sodium chloride 75 mL/hr at 02/16/18 0800  . famotidine (PEPCID) IV Stopped (02/15/18 2246)  . ferric gluconate (FERRLECIT/NULECIT) IV       PRN Meds:.acetaminophen **OR** acetaminophen, ondansetron **OR** ondansetron (ZOFRAN) IV, traMADol   PHYSICAL EXAM: Vital signs: Vitals:   02/15/18 0730 02/15/18 1303 02/15/18 2112 02/16/18 0524  BP: (!) 113/53 (!) 121/50 92/70 114/60  Pulse: 62 (!) 57 72 (!) 53  Resp: 15 16 18 16   Temp: 98.5 F (36.9 C) 97.8 F (36.6 C) 98.2 F (36.8 C) 98.1 F (36.7 C)  TempSrc: Oral Oral Oral Oral  SpO2: 95% 97% 96% 91%  Weight:      Height:       Filed Weights   02/14/18 1647  Weight: 125.2 kg   Body mass index is 43.23 kg/m.   General appearance :Awake, alert, not in any distress. Speech Clear.  HEENT: Atraumatic and Normocephalic Neck: supple, no JVD. No cervical lymphadenopathy. No thyromegaly Resp:Good air entry bilaterally, no added sounds  CVS: S1 S2 regular, no murmurs.  GI: Bowel sounds present, Non tender and not distended with no gaurding, rigidity or rebound.No organomegaly Extremities: B/L Lower Ext shows no edema, both legs are warm to touch Neurology:  speech clear,Non focal, sensation is grossly intact. Psychiatric: Normal judgment and insight. Alert and oriented x 3. Normal mood. Musculoskeletal:No digital cyanosis Skin:No Rash, warm and dry Wounds:N/A  I have personally reviewed following labs and imaging studies  LABORATORY DATA: CBC: Recent Labs  Lab 02/14/18 1656 02/15/18 1004 02/16/18 0328  WBC 6.2 5.9 6.1  HGB 6.4* 8.1* 8.0*  HCT 24.5* 28.7* 28.2*  MCV 74.5* 76.1* 76.4*  PLT 360 313 307    Basic Metabolic Panel: Recent Labs  Lab  02/14/18 1656 02/15/18 1004  NA 137 139  K 4.0 4.3  CL 100 102  CO2 27 30  GLUCOSE 93 84  BUN 12 9  CREATININE 0.83 0.76  CALCIUM 8.8* 8.8*    GFR: Estimated Creatinine Clearance: 105.3 mL/min (by C-G formula based on SCr of 0.76 mg/dL).  Liver Function Tests: Recent Labs  Lab 02/14/18 1656  AST 20  ALT 12  ALKPHOS 75  BILITOT 0.4  PROT 7.4  ALBUMIN 3.5   No results for input(s): LIPASE, AMYLASE  in the last 168 hours. No results for input(s): AMMONIA in the last 168 hours.  Coagulation Profile: No results for input(s): INR, PROTIME in the last 168 hours.  Cardiac Enzymes: No results for input(s): CKTOTAL, CKMB, CKMBINDEX, TROPONINI in the last 168 hours.  BNP (last 3 results) No results for input(s): PROBNP in the last 8760 hours.  HbA1C: No results for input(s): HGBA1C in the last 72 hours.  CBG: No results for input(s): GLUCAP in the last 168 hours.  Lipid Profile: No results for input(s): CHOL, HDL, LDLCALC, TRIG, CHOLHDL, LDLDIRECT in the last 72 hours.  Thyroid Function Tests: No results for input(s): TSH, T4TOTAL, FREET4, T3FREE, THYROIDAB in the last 72 hours.  Anemia Panel: Recent Labs    02/15/18 0015  VITAMINB12 813  FOLATE 22.2  FERRITIN 3*  TIBC 521*  IRON 10*  RETICCTPCT 0.9    Urine analysis:    Component Value Date/Time   COLORURINE YELLOW 08/16/2015 2141   APPEARANCEUR CLOUDY (A) 08/16/2015 2141   LABSPEC 1.019 08/16/2015 2141   PHURINE 7.0 08/16/2015 2141   GLUCOSEU NEGATIVE 08/16/2015 2141   HGBUR NEGATIVE 08/16/2015 2141   BILIRUBINUR NEGATIVE 08/16/2015 2141   KETONESUR NEGATIVE 08/16/2015 2141   PROTEINUR NEGATIVE 08/16/2015 2141   NITRITE NEGATIVE 08/16/2015 2141   LEUKOCYTESUR NEGATIVE 08/16/2015 2141    Sepsis Labs: Lactic Acid, Venous No results found for: LATICACIDVEN  MICROBIOLOGY: Recent Results (from the past 240 hour(s))  MRSA PCR Screening     Status: None   Collection Time: 02/15/18  5:49 AM  Result Value Ref Range Status   MRSA by PCR NEGATIVE NEGATIVE Final    Comment:        The GeneXpert MRSA Assay (FDA approved for NASAL specimens only), is one component of a comprehensive MRSA colonization surveillance program. It is not intended to diagnose MRSA infection nor to guide or monitor treatment for MRSA infections. Performed at [facility], [address], [place], [place] [address]      RADIOLOGY STUDIES/RESULTS: Dg Chest Port 1 View  Result Date: 02/15/2018 CLINICAL DATA:  Dyspnea EXAM: PORTABLE CHEST 1 VIEW COMPARISON:  07/04/2017 chest radiograph. FINDINGS: Stable cardiomediastinal silhouette with mild cardiomegaly and large hiatal hernia. No pneumothorax. No pleural effusion. No overt pulmonary edema. No acute consolidative airspace disease. IMPRESSION: 1. Stable mild cardiomegaly without overt pulmonary edema. 2. Large hiatal hernia. Electronically Signed   By: Delbert Phenix M.D.   On: 02/15/2018 08:39     LOS: 0 days   Jeoffrey Massed, MD  [facility]  If 7PM-7AM, please contact night-coverage  Please page via [email or web address] [email or web address]-click on MD name and type text message  02/16/2018, 11:03 AM

## 2018-02-16 NOTE — Discharge Summary (Signed)
PATIENT DETAILS Name: Emily Holland Age: 57 y.o. Sex: female Date of Birth: 1961/05/28 MRN: [id number]. Admitting Physician: Eduard Clos, MD [id number], Dorma Russell, MD  Admit Date: 02/14/2018 Discharge date: 02/16/2018  Recommendations for Outpatient Follow-up:  1. Follow up with PCP in 1-2 weeks 2. Please obtain BMP/CBC in one week 3. Please ensure follow-up with Dr. Elnoria Howard  Admitted From:  Home  Disposition: Home   Home Health: No  Equipment/Devices: None  Discharge Condition: Stable  CODE STATUS: FULL CODE  Diet recommendation:  Regular  Brief Summary: See H&P, Labs, Consult and Test reports for all details in brief,Patient is a 57 y.o. female with prior history of gout,GI bleed in 2017 without unclear etiology in spite of extensive work-up-presenting with symptomatic anemia and intermittent black-colored stools (on iron supplementation).  Hemoglobin on admission was 6.4, patient was transfused 2 units of PRBC and admitted to the hospitalist service.  See below for further details.  Brief Hospital Course: GI bleeding: Foci of GI loss remains unclear, had an extensive work-up 2017 that was negative, seen by gastroenterology-underwent capsule endoscopy-spoke with Dr. Elnoria Howard today-may have a possible bleeding site in the stomach that is already healing without any active bleeding.  Recommends PPI and follow-up with him in the outpatient setting-stable for discharge.  Instructed to follow with Dr. Elnoria Howard in 1-2 weeks.  Subacute loss anemia in the setting of GI bleeding/chronic iron deficiency: Hemoglobin stable after 2 units of PRBC transfusion, spoke with pharmacy-IV iron x1 today-we will subsequently discharge on usual oral supplementation of iron..  Gout: No evidence of flare.  Procedures/Studies: 9/10>> capsule endoscopy  Discharge Diagnoses:  Principal Problem:   Symptomatic anemia Active Problems:   Acute blood loss anemia   GI bleeding   Discharge  Instructions:  Activity:  As tolerated   Discharge Instructions    Call MD for:   Complete by:  As directed    If you have bloody or black tarry stools.  Or if you vomit blood.   Diet general   Complete by:  As directed    Discharge instructions   Complete by:  As directed    Follow with Primary MD  Fleet Contras, MD  And Dr Elnoria Howard in 1 week  Please get a complete blood count and chemistry panel checked by your Primary MD at your next visit, and again as instructed by your Primary MD.  Get Medicines reviewed and adjusted: Please take all your medications with you for your next visit with your Primary MD  Laboratory/radiological data: Please request your Primary MD to go over all hospital tests and procedure/radiological results at the follow up, please ask your Primary MD to get all Hospital records sent to his/her office.  In some cases, they will be blood work, cultures and biopsy results pending at the time of your discharge. Please request that your primary care M.D. follows up on these results.  Also Note the following: If you experience worsening of your admission symptoms, develop shortness of breath, life threatening emergency, suicidal or homicidal thoughts you must seek medical attention immediately by calling 911 or calling your MD immediately  if symptoms less severe.  You must read complete instructions/literature along with all the possible adverse reactions/side effects for all the Medicines you take and that have been prescribed to you. Take any new Medicines after you have completely understood and accpet all the possible adverse reactions/side effects.   Do not drive when taking Pain medications or sleeping medications (Benzodaizepines)  Do not take more than prescribed Pain, Sleep and Anxiety Medications. It is not advisable to combine anxiety,sleep and pain medications without talking with your primary care practitioner  Special Instructions: If you have smoked or  chewed Tobacco  in the last 2 yrs please stop smoking, stop any regular Alcohol  and or any Recreational drug use.  Wear Seat belts while driving.  Please note: You were cared for by a hospitalist during your hospital stay. Once you are discharged, your primary care physician will handle any further medical issues. Please note that NO REFILLS for any discharge medications will be authorized once you are discharged, as it is imperative that you return to your primary care physician (or establish a relationship with a primary care physician if you do not have one) for your post hospital discharge needs so that they can reassess your need for medications and monitor your lab values.   Increase activity slowly   Complete by:  As directed      Allergies as of 02/16/2018      Reactions   Feraheme [ferumoxytol] Rash   Developed on 3rd iron infusion in Jan.      Medication List    TAKE these medications   acetaminophen 325 MG tablet Commonly known as:  TYLENOL Take 650 mg by mouth every 6 (six) hours as needed for mild pain.   cholecalciferol 1000 units tablet Commonly known as:  VITAMIN D Take 1,000 Units by mouth daily.   ferrous sulfate 325 (65 FE) MG tablet Take 1 tablet (325 mg total) by mouth 2 (two) times daily with a meal. What changed:  when to take this   pantoprazole 40 MG tablet Commonly known as:  PROTONIX Take 1 tablet (40 mg total) by mouth 2 (two) times daily.   polyethylene glycol packet Commonly known as:  MIRALAX / GLYCOLAX Take 17 g by mouth daily as needed for mild constipation or moderate constipation.      Follow-up Information    Fleet Contras, MD. Schedule an appointment as soon as possible for a visit in 1 week(s).   Specialty:  Internal Medicine Contact information: [address] [place] [address] [phone]        Jeani Hawking, MD. Schedule an appointment as soon as possible for a visit in 1 week(s).   Specialty:   Gastroenterology Contact information: [address] [place] [address] [phone]          Allergies  Allergen Reactions  . Feraheme [Ferumoxytol] Rash    Developed on 3rd iron infusion in Jan.    Consultations:   GI  Other Procedures/Studies: Dg Chest Port 1 View  Result Date: 02/15/2018 CLINICAL DATA:  Dyspnea EXAM: PORTABLE CHEST 1 VIEW COMPARISON:  07/04/2017 chest radiograph. FINDINGS: Stable cardiomediastinal silhouette with mild cardiomegaly and large hiatal hernia. No pneumothorax. No pleural effusion. No overt pulmonary edema. No acute consolidative airspace disease. IMPRESSION: 1. Stable mild cardiomegaly without overt pulmonary edema. 2. Large hiatal hernia. Electronically Signed   By: Delbert Phenix M.D.   On: 02/15/2018 08:39      TODAY-DAY OF DISCHARGE:  Subjective:   Edmonia Lynch today has no headache,no chest abdominal pain,no new weakness tingling or numbness, feels much better wants to go home today.  Objective:   Blood pressure 114/60, pulse (!) 53, temperature 98.1 F (36.7 C), temperature source Oral, resp. rate 16, height 5\' 7"  (1.702 m), weight 125.2 kg, SpO2 91 %.  Intake/Output Summary (Last 24 hours) at 02/16/2018  1455 Last data filed at 02/16/2018 1100 Gross per 24 hour  Intake 829.3 ml  Output -  Net 829.3 ml   Filed Weights   02/14/18 1647  Weight: 125.2 kg    Exam: Awake Alert, Oriented *3, No new F.N deficits, Normal affect [place].AT,PERRAL Supple Neck,No JVD, No cervical lymphadenopathy appriciated.  Symmetrical Chest wall movement, Good air movement bilaterally, CTAB RRR,No Gallops,Rubs or new Murmurs, No Parasternal Heave +ve B.Sounds, Abd Soft, Non tender, No organomegaly appriciated, No rebound -guarding or rigidity. No Cyanosis, Clubbing or edema, No new Rash or bruise   PERTINENT RADIOLOGIC STUDIES: Dg Chest Port 1 View  Result Date: 02/15/2018 CLINICAL DATA:  Dyspnea EXAM: PORTABLE CHEST 1 VIEW  COMPARISON:  07/04/2017 chest radiograph. FINDINGS: Stable cardiomediastinal silhouette with mild cardiomegaly and large hiatal hernia. No pneumothorax. No pleural effusion. No overt pulmonary edema. No acute consolidative airspace disease. IMPRESSION: 1. Stable mild cardiomegaly without overt pulmonary edema. 2. Large hiatal hernia. Electronically Signed   By: Delbert Phenix M.D.   On: 02/15/2018 08:39     PERTINENT LAB RESULTS: CBC: Recent Labs    02/15/18 1004 02/16/18 0328  WBC 5.9 6.1  HGB 8.1* 8.0*  HCT 28.7* 28.2*  PLT 313 307   CMET CMP     Component Value Date/Time   NA 139 02/15/2018 1004   NA 140 05/21/2016 1339   K 4.3 02/15/2018 1004   K 3.7 05/21/2016 1339   CL 102 02/15/2018 1004   CO2 30 02/15/2018 1004   CO2 29 05/21/2016 1339   GLUCOSE 84 02/15/2018 1004   GLUCOSE 146 (H) 05/21/2016 1339   BUN 9 02/15/2018 1004   BUN 15.3 05/21/2016 1339   CREATININE 0.76 02/15/2018 1004   CREATININE 0.7 05/21/2016 1339   CALCIUM 8.8 (L) 02/15/2018 1004   CALCIUM 9.0 05/21/2016 1339   PROT 7.4 02/14/2018 1656   PROT 8.1 05/21/2016 1339   ALBUMIN 3.5 02/14/2018 1656   ALBUMIN 3.3 (L) 05/21/2016 1339   AST 20 02/14/2018 1656   AST 17 05/21/2016 1339   ALT 12 02/14/2018 1656   ALT 13 05/21/2016 1339   ALKPHOS 75 02/14/2018 1656   ALKPHOS 100 05/21/2016 1339   BILITOT 0.4 02/14/2018 1656   BILITOT 0.28 05/21/2016 1339   GFRNONAA >60 02/15/2018 1004   GFRAA >60 02/15/2018 1004    GFR Estimated Creatinine Clearance: 105.3 mL/min (by C-G formula based on SCr of 0.76 mg/dL). No results for input(s): LIPASE, AMYLASE in the last 72 hours. No results for input(s): CKTOTAL, CKMB, CKMBINDEX, TROPONINI in the last 72 hours. Invalid input(s): POCBNP No results for input(s): DDIMER in the last 72 hours. No results for input(s): HGBA1C in the last 72 hours. No results for input(s): CHOL, HDL, LDLCALC, TRIG, CHOLHDL, LDLDIRECT in the last 72 hours. No results for input(s): TSH,  T4TOTAL, T3FREE, THYROIDAB in the last 72 hours.  Invalid input(s): FREET3 Recent Labs    02/15/18 0015  VITAMINB12 813  FOLATE 22.2  FERRITIN 3*  TIBC 521*  IRON 10*  RETICCTPCT 0.9   Coags: No results for input(s): INR in the last 72 hours.  Invalid input(s): PT Microbiology: Recent Results (from the past 240 hour(s))  MRSA PCR Screening     Status: None   Collection Time: 02/15/18  5:49 AM  Result Value Ref Range Status   MRSA by PCR NEGATIVE NEGATIVE Final    Comment:        The GeneXpert MRSA Assay (FDA approved for NASAL specimens  only), is one component of a comprehensive MRSA colonization surveillance program. It is not intended to diagnose MRSA infection nor to guide or monitor treatment for MRSA infections. Performed at [facility], [address] [place], [place] [address]     FURTHER DISCHARGE INSTRUCTIONS:  Get Medicines reviewed and adjusted: Please take all your medications with you for your next visit with your Primary MD  Laboratory/radiological data: Please request your Primary MD to go over all hospital tests and procedure/radiological results at the follow up, please ask your Primary MD to get all Hospital records sent to his/her office.  In some cases, they will be blood work, cultures and biopsy results pending at the time of your discharge. Please request that your primary care M.D. goes through all the records of your hospital data and follows up on these results.  Also Note the following: If you experience worsening of your admission symptoms, develop shortness of breath, life threatening emergency, suicidal or homicidal thoughts you must seek medical attention immediately by calling 911 or calling your MD immediately  if symptoms less severe.  You must read complete instructions/literature along with all the possible adverse reactions/side effects for all the Medicines you take and that have been prescribed to you. Take any new  Medicines after you have completely understood and accpet all the possible adverse reactions/side effects.   Do not drive when taking Pain medications or sleeping medications (Benzodaizepines)  Do not take more than prescribed Pain, Sleep and Anxiety Medications. It is not advisable to combine anxiety,sleep and pain medications without talking with your primary care practitioner  Special Instructions: If you have smoked or chewed Tobacco  in the last 2 yrs please stop smoking, stop any regular Alcohol  and or any Recreational drug use.  Wear Seat belts while driving.  Please note: You were cared for by a hospitalist during your hospital stay. Once you are discharged, your primary care physician will handle any further medical issues. Please note that NO REFILLS for any discharge medications will be authorized once you are discharged, as it is imperative that you return to your primary care physician (or establish a relationship with a primary care physician if you do not have one) for your post hospital discharge needs so that they can reassess your need for medications and monitor your lab values.  Total Time spent coordinating discharge including counseling, education and face to face time equals 30 minutes.  SignedJeoffrey Massed 02/16/2018 2:55 PM

## 2018-02-16 NOTE — Plan of Care (Signed)
Patient is making progress toward discharge, patient is able to tolerate diet as ordered, GI monitor discontinued per order, patient is alert and oriented, no acute changes noted.

## 2018-02-17 MED FILL — PANTOPRAZOLE SOD DR 40 MG T: 40 | 90 days supply | Qty: 90 | Fill #3

## 2018-02-28 DIAGNOSIS — D509 Iron deficiency anemia, unspecified: Secondary | ICD-10-CM | POA: Diagnosis not present

## 2018-02-28 DIAGNOSIS — R195 Other fecal abnormalities: Secondary | ICD-10-CM | POA: Diagnosis not present

## 2018-03-07 DIAGNOSIS — D62 Acute posthemorrhagic anemia: Secondary | ICD-10-CM | POA: Diagnosis not present

## 2018-03-07 DIAGNOSIS — M179 Osteoarthritis of knee, unspecified: Secondary | ICD-10-CM | POA: Diagnosis not present

## 2018-03-07 DIAGNOSIS — K2901 Acute gastritis with bleeding: Secondary | ICD-10-CM | POA: Diagnosis not present

## 2018-03-07 DIAGNOSIS — R7303 Prediabetes: Secondary | ICD-10-CM | POA: Diagnosis not present

## 2018-04-18 DIAGNOSIS — R7303 Prediabetes: Secondary | ICD-10-CM | POA: Diagnosis not present

## 2018-04-18 DIAGNOSIS — K2901 Acute gastritis with bleeding: Secondary | ICD-10-CM | POA: Diagnosis not present

## 2018-04-18 DIAGNOSIS — M179 Osteoarthritis of knee, unspecified: Secondary | ICD-10-CM | POA: Diagnosis not present

## 2018-04-18 DIAGNOSIS — J302 Other seasonal allergic rhinitis: Secondary | ICD-10-CM | POA: Diagnosis not present

## 2018-04-18 MED FILL — METHOCARBAMOL 750 MG TABS: 750 | 30 days supply | Qty: 60 | Fill #0

## 2018-04-18 MED FILL — FLUTICASONE PROP 50 MCG SPR: 50 | 30 days supply | Qty: 16 | Fill #0

## 2018-04-19 MED FILL — traMADol HCL 50 MG TABS: 50 | 30 days supply | Qty: 20 | Fill #0

## 2018-05-13 DIAGNOSIS — M179 Osteoarthritis of knee, unspecified: Secondary | ICD-10-CM | POA: Diagnosis not present

## 2018-05-13 DIAGNOSIS — J302 Other seasonal allergic rhinitis: Secondary | ICD-10-CM | POA: Diagnosis not present

## 2018-05-13 DIAGNOSIS — R7303 Prediabetes: Secondary | ICD-10-CM | POA: Diagnosis not present

## 2018-05-25 DIAGNOSIS — D509 Iron deficiency anemia, unspecified: Secondary | ICD-10-CM | POA: Diagnosis not present

## 2018-06-28 DIAGNOSIS — K2970 Gastritis, unspecified, without bleeding: Secondary | ICD-10-CM | POA: Diagnosis not present

## 2018-06-28 DIAGNOSIS — Z7689 Persons encountering health services in other specified circumstances: Secondary | ICD-10-CM | POA: Diagnosis not present

## 2018-08-15 DIAGNOSIS — M179 Osteoarthritis of knee, unspecified: Secondary | ICD-10-CM | POA: Diagnosis not present

## 2018-08-15 DIAGNOSIS — M1A9XX0 Chronic gout, unspecified, without tophus (tophi): Secondary | ICD-10-CM | POA: Diagnosis not present

## 2018-08-15 DIAGNOSIS — J302 Other seasonal allergic rhinitis: Secondary | ICD-10-CM | POA: Diagnosis not present

## 2018-08-15 DIAGNOSIS — Z0000 Encounter for general adult medical examination without abnormal findings: Secondary | ICD-10-CM | POA: Diagnosis not present

## 2018-08-15 DIAGNOSIS — Z6841 Body Mass Index (BMI) 40.0 and over, adult: Secondary | ICD-10-CM | POA: Diagnosis not present

## 2018-08-15 DIAGNOSIS — R7303 Prediabetes: Secondary | ICD-10-CM | POA: Diagnosis not present

## 2018-08-15 MED FILL — OLOPATADINE HCL 0.2% EYE DR: 0.2 | 25 days supply | Qty: 3 | Fill #0

## 2018-08-15 MED FILL — FLUTICASONE PROP 50 MCG SPR: 50 | 30 days supply | Qty: 16 | Fill #0

## 2018-11-14 DIAGNOSIS — M179 Osteoarthritis of knee, unspecified: Secondary | ICD-10-CM | POA: Diagnosis not present

## 2018-11-14 DIAGNOSIS — M1A9XX0 Chronic gout, unspecified, without tophus (tophi): Secondary | ICD-10-CM | POA: Diagnosis not present

## 2018-11-14 DIAGNOSIS — R7303 Prediabetes: Secondary | ICD-10-CM | POA: Diagnosis not present

## 2018-11-14 DIAGNOSIS — J302 Other seasonal allergic rhinitis: Secondary | ICD-10-CM | POA: Diagnosis not present

## 2018-11-14 DIAGNOSIS — D62 Acute posthemorrhagic anemia: Secondary | ICD-10-CM | POA: Diagnosis not present

## 2018-11-14 MED FILL — OLOPATADINE HCL 0.2% EYE DR: 0.2 | 25 days supply | Qty: 3 | Fill #0

## 2018-12-19 DIAGNOSIS — J302 Other seasonal allergic rhinitis: Secondary | ICD-10-CM | POA: Diagnosis not present

## 2018-12-19 DIAGNOSIS — K2901 Acute gastritis with bleeding: Secondary | ICD-10-CM | POA: Diagnosis not present

## 2018-12-19 DIAGNOSIS — M179 Osteoarthritis of knee, unspecified: Secondary | ICD-10-CM | POA: Diagnosis not present

## 2018-12-19 DIAGNOSIS — R7303 Prediabetes: Secondary | ICD-10-CM | POA: Diagnosis not present

## 2019-01-23 DIAGNOSIS — J302 Other seasonal allergic rhinitis: Secondary | ICD-10-CM | POA: Diagnosis not present

## 2019-01-23 DIAGNOSIS — L259 Unspecified contact dermatitis, unspecified cause: Secondary | ICD-10-CM | POA: Diagnosis not present

## 2019-01-23 DIAGNOSIS — M179 Osteoarthritis of knee, unspecified: Secondary | ICD-10-CM | POA: Diagnosis not present

## 2019-01-23 DIAGNOSIS — R7303 Prediabetes: Secondary | ICD-10-CM | POA: Diagnosis not present

## 2019-01-23 DIAGNOSIS — M1A9XX0 Chronic gout, unspecified, without tophus (tophi): Secondary | ICD-10-CM | POA: Diagnosis not present

## 2019-01-23 MED FILL — TRIAMCINOLONE 0.5% CREAM: 0.5 | 30 days supply | Qty: 60 | Fill #0

## 2019-01-23 MED FILL — COLCHICINE 0.6 MG TABS: 0.6 | 15 days supply | Qty: 30 | Fill #0

## 2019-01-23 MED FILL — OLOPATADINE HCL 0.2% EYE DR: 0.2 | 50 days supply | Qty: 3 | Fill #0

## 2019-01-23 MED FILL — ALLOPURINOL 300 MG TABS: 300 | 30 days supply | Qty: 30 | Fill #0

## 2019-01-27 ENCOUNTER — Other Ambulatory Visit: Payer: Self-pay | Admitting: Internal Medicine

## 2019-01-27 DIAGNOSIS — Z1231 Encounter for screening mammogram for malignant neoplasm of breast: Secondary | ICD-10-CM

## 2019-03-02 DIAGNOSIS — E119 Type 2 diabetes mellitus without complications: Secondary | ICD-10-CM | POA: Diagnosis not present

## 2019-03-02 DIAGNOSIS — H524 Presbyopia: Secondary | ICD-10-CM | POA: Diagnosis not present

## 2019-03-02 DIAGNOSIS — H5202 Hypermetropia, left eye: Secondary | ICD-10-CM | POA: Diagnosis not present

## 2019-03-02 DIAGNOSIS — I10 Essential (primary) hypertension: Secondary | ICD-10-CM | POA: Diagnosis not present

## 2019-03-02 DIAGNOSIS — H11153 Pinguecula, bilateral: Secondary | ICD-10-CM | POA: Diagnosis not present

## 2019-03-02 DIAGNOSIS — H04123 Dry eye syndrome of bilateral lacrimal glands: Secondary | ICD-10-CM | POA: Diagnosis not present

## 2019-03-09 MED FILL — ALLOPURINOL 300 MG TABS: 300 | 30 days supply | Qty: 30 | Fill #1

## 2019-03-09 MED FILL — COLCHICINE 0.6 MG TABS: 0.6 | 15 days supply | Qty: 30 | Fill #1

## 2019-03-23 ENCOUNTER — Ambulatory Visit
Admission: RE | Admit: 2019-03-23 | Discharge: 2019-03-23 | Disposition: A | Discharge status: Home / Self Care | Payer: 59 | Source: Ambulatory Visit | Attending: Internal Medicine | Admitting: Internal Medicine

## 2019-03-23 ENCOUNTER — Other Ambulatory Visit: Payer: Self-pay

## 2019-03-23 DIAGNOSIS — Z1231 Encounter for screening mammogram for malignant neoplasm of breast: Secondary | ICD-10-CM

## 2019-04-24 DIAGNOSIS — M1A9XX0 Chronic gout, unspecified, without tophus (tophi): Secondary | ICD-10-CM | POA: Diagnosis not present

## 2019-04-24 DIAGNOSIS — M179 Osteoarthritis of knee, unspecified: Secondary | ICD-10-CM | POA: Diagnosis not present

## 2019-04-24 MED FILL — DICLOFENAC SODIUM 1 % GEL: 1 | 32 days supply | Qty: 500 | Fill #0

## 2019-07-17 ENCOUNTER — Ambulatory Visit (HOSPITAL_COMMUNITY)
Admission: EM | Admit: 2019-07-17 | Discharge: 2019-07-17 | Disposition: A | Discharge status: Home / Self Care | Payer: 59 | Attending: Family Medicine | Admitting: Family Medicine

## 2019-07-17 ENCOUNTER — Encounter (HOSPITAL_COMMUNITY): Payer: Self-pay

## 2019-07-17 ENCOUNTER — Other Ambulatory Visit: Payer: Self-pay

## 2019-07-17 DIAGNOSIS — Z20822 Contact with and (suspected) exposure to covid-19: Secondary | ICD-10-CM | POA: Diagnosis not present

## 2019-07-17 DIAGNOSIS — Z1152 Encounter for screening for COVID-19: Secondary | ICD-10-CM

## 2019-07-17 NOTE — ED Triage Notes (Addendum)
Pt is here for COVID testing for travel, she is traveling to [place] on 2/12-2/26. Informed pt her test would result in 2-3 days. Pt is denying ALL signs/symptoms today.

## 2019-07-17 NOTE — Discharge Instructions (Addendum)
We have tested you for COVID We will call you with any positive results.

## 2019-07-18 MED FILL — ATOVAQUONE-PROGUANIL 250-10: 250-100 | 25 days supply | Qty: 25 | Fill #0

## 2019-07-18 MED FILL — AZITHROMYCIN 500 MG TABLET: 500 | 3 days supply | Qty: 4 | Fill #0

## 2019-07-18 NOTE — ED Provider Notes (Signed)
[facility]    CSN: [id number] Arrival date & time: 07/17/19  1194      History   Chief Complaint Chief Complaint  Patient presents with  . COVID testing    HPI Shirin Echeverry is a 59 y.o. female.   Patient is a 59 year old female presents today for Covid testing.  She needs this for traveling.  She is traveling to [place] on 2/12.  Denies any symptoms     Past Medical History:  Diagnosis Date  . Arthritis    "bones; joints; legs; ankles" (03/17/2016)  . GERD (gastroesophageal reflux disease)   . Gout   . History of blood transfusion 06/2015; 08/2015; 03/17/2016  . History of iron deficiency anemia    took iron supplements 06/2015-3/ 2017; stopped/dr's order (03/17/2016)  . Iron deficiency anemia due to chronic blood loss 07/04/2017  . Low hemoglobin 06/2015; 08/2015; 03/17/2016  . Shortness of breath on exertion 06/03/2011  . Symptomatic anemia 06/2015; 08/2015; 03/17/2016    Patient Active Problem List   Diagnosis Date Noted  . Iron deficiency anemia due to chronic blood loss 04/22/2016  . GERD (gastroesophageal reflux disease) 03/18/2016  . Anemia 03/17/2016  . Symptomatic anemia 03/17/2016  . Gout 08/17/2015  . Anemia, iron deficiency 08/17/2015  . GI bleeding 06/15/2015  . Acute blood loss anemia 06/11/2015  . Hypoxemia 06/03/2011  . Fever 06/03/2011  . Obesity 06/03/2011    Past Surgical History:  Procedure Laterality Date  . CERVICAL CONIZATION W/BX  08/2004  . COLONOSCOPY N/A 06/13/2015   Procedure: COLONOSCOPY;  Surgeon: Jeani Hawking, MD;  Location: [facility];  Service: Endoscopy;  Laterality: N/A;  . ESOPHAGOGASTRODUODENOSCOPY N/A 06/13/2015   Procedure: ESOPHAGOGASTRODUODENOSCOPY (EGD)/Colonoscopy;  Surgeon: Jeani Hawking, MD;  Location: [facility];  Service: Endoscopy;  Laterality: N/A;  . GIVENS CAPSULE STUDY N/A 06/14/2015   Procedure: GIVENS CAPSULE STUDY;  Surgeon: Jeani Hawking, MD;  Location: [facility];  Service: Endoscopy;   Laterality: N/A;  . GIVENS CAPSULE STUDY N/A 03/19/2016   Procedure: GIVENS CAPSULE STUDY;  Surgeon: Jeani Hawking, MD;  Location: [facility] ENDOSCOPY;  Service: Endoscopy;  Laterality: N/A;  . GIVENS CAPSULE STUDY N/A 07/05/2017   Procedure: GIVENS CAPSULE STUDY;  Surgeon: Jeani Hawking, MD;  Location: [facility] ENDOSCOPY;  Service: Endoscopy;  Laterality: N/A;  . GIVENS CAPSULE STUDY N/A 02/15/2018   Procedure: GIVENS CAPSULE STUDY;  Surgeon: Jeani Hawking, MD;  Location: [facility] ENDOSCOPY;  Service: Endoscopy;  Laterality: N/A;  . VAGINAL HYSTERECTOMY  ~ 2005    OB History    Gravida  0   Para  0   Term  0   Preterm  0   AB  0   Living        SAB  0   TAB  0   Ectopic  0   Multiple      Live Births               Home Medications    Prior to Admission medications   Medication Sig Start Date End Date Taking? Authorizing Provider  acetaminophen (TYLENOL) 325 MG tablet Take 650 mg by mouth every 6 (six) hours as needed for mild pain.    [provider]  allopurinol (ZYLOPRIM) 300 MG tablet  03/09/19   [provider]  cholecalciferol (VITAMIN D) 1000 units tablet Take 1,000 Units by mouth daily.    [provider]  colchicine 0.6 MG tablet  03/09/19   [provider]  ferrous sulfate 325 (  65 FE) MG tablet Take 1 tablet (325 mg total) by mouth 2 (two) times daily with a meal. Patient taking differently: Take 325 mg by mouth at bedtime.  03/20/16   Tyrone Nine, MD  Olopatadine HCl 0.2 % SOLN  01/23/19   [provider]  pantoprazole (PROTONIX) 40 MG tablet Take 1 tablet (40 mg total) by mouth 2 (two) times daily. 02/16/18   Ghimire, Werner Lean, MD  polyethylene glycol (MIRALAX / Ethelene Hal) packet Take 17 g by mouth daily as needed for mild constipation or moderate constipation. 07/06/17   Briant Cedar, MD  triamcinolone cream (KENALOG) 0.5 %  01/23/19   [provider]    Family History Family History  Problem Relation Age of  Onset  . COPD Other   . Healthy Mother   . Allergies Father   . Hernia Father     Social History Social History   Tobacco Use  . Smoking status: Never Smoker  . Smokeless tobacco: Never Used  Substance Use Topics  . Alcohol use: No    Comment: Occassional  . Drug use: No     Allergies   Feraheme [ferumoxytol]   Review of Systems Review of Systems  Constitutional: Negative for activity change, appetite change, chills, fatigue and fever.  HENT: Negative for congestion, postnasal drip, rhinorrhea, sinus pressure, sinus pain, sneezing and sore throat.   Respiratory: Negative for cough and shortness of breath.   Gastrointestinal: Negative for diarrhea.  Musculoskeletal: Negative for myalgias.     Physical Exam Triage Vital Signs ED Triage Vitals  Enc Vitals Group     BP 07/17/19 1003 137/67     Pulse Rate 07/17/19 1003 79     Resp 07/17/19 1003 19     Temp 07/17/19 1003 99.1 F (37.3 C)     Temp Source 07/17/19 1003 Oral     SpO2 07/17/19 1003 100 %     Weight 07/17/19 0957 274 lb 3.2 oz (124.4 kg)     Height --      Head Circumference --      Peak Flow --      Pain Score 07/17/19 0957 0     Pain Loc --      Pain Edu? --      Excl. in GC? --    No data found.  Updated Vital Signs BP 137/67 (BP Location: Left Arm)   Pulse 79   Temp 99.1 F (37.3 C) (Oral)   Resp 19   Wt 274 lb 3.2 oz (124.4 kg)   SpO2 100%   BMI 42.95 kg/m   Visual Acuity Right Eye Distance:   Left Eye Distance:   Bilateral Distance:    Right Eye Near:   Left Eye Near:    Bilateral Near:     Physical Exam   UC Treatments / Results  Labs (all labs ordered are listed, but only abnormal results are displayed) Labs Reviewed  NOVEL CORONAVIRUS, NAA (HOSP ORDER, SEND-OUT TO REF LAB; TAT 18-24 HRS)    EKG   Radiology No results found.  Procedures Procedures (including critical care time)  Medications Ordered in UC Medications - No data to display  Initial Impression  / Assessment and Plan / UC Course  I have reviewed the triage vital signs and the nursing notes.  Pertinent labs & imaging results that were available during my care of the patient were reviewed by me and considered in my medical decision making (see chart for details).  Covid testing done for travel  with labs pending.  Patient asymptomatic. Final Clinical Impressions(s) / UC Diagnoses   Final diagnoses:  Encounter for screening laboratory testing for COVID-19 virus     Discharge Instructions     We have tested you for COVID We will call you with any positive results.     ED Prescriptions    None     PDMP not reviewed this encounter.   Loura Halt A, NP 07/18/19 [phone]

## 2019-07-19 LAB — NOVEL CORONAVIRUS, NAA (HOSP ORDER, SEND-OUT TO REF LAB; TAT 18-24 HRS): SARS-CoV-2, NAA: NOT DETECTED

## 2019-07-24 ENCOUNTER — Other Ambulatory Visit: Payer: Self-pay

## 2019-07-24 ENCOUNTER — Encounter (HOSPITAL_COMMUNITY): Payer: Self-pay

## 2019-07-24 ENCOUNTER — Ambulatory Visit (HOSPITAL_COMMUNITY)
Admission: EM | Admit: 2019-07-24 | Discharge: 2019-07-24 | Disposition: A | Discharge status: Home / Self Care | Payer: 59 | Attending: Family Medicine | Admitting: Family Medicine

## 2019-07-24 DIAGNOSIS — Z20822 Contact with and (suspected) exposure to covid-19: Secondary | ICD-10-CM | POA: Diagnosis not present

## 2019-07-24 NOTE — Discharge Instructions (Addendum)
We will call you if our results are positive

## 2019-07-24 NOTE — ED Triage Notes (Signed)
Pt state she has needs Covid test for travel. Pt states she laves on Thursday.

## 2019-07-25 NOTE — ED Provider Notes (Signed)
[facility]    CSN: [id number] Arrival date & time: 07/24/19  1315      History   Chief Complaint Chief Complaint  Patient presents with  . Covid test for travel    HPI Emily Holland is a 59 y.o. female.   Patient is a 59 year old female that presents today for Covid testing for travel.  She plans to travel on Thursday.  She was seen here a few weeks back for similar but never was able to travel due to weather canceling her flight.  Currently asymptomatic     Past Medical History:  Diagnosis Date  . Arthritis    "bones; joints; legs; ankles" (03/17/2016)  . GERD (gastroesophageal reflux disease)   . Gout   . History of blood transfusion 06/2015; 08/2015; 03/17/2016  . History of iron deficiency anemia    took iron supplements 06/2015-3/ 2017; stopped/dr's order (03/17/2016)  . Iron deficiency anemia due to chronic blood loss 07/04/2017  . Low hemoglobin 06/2015; 08/2015; 03/17/2016  . Shortness of breath on exertion 06/03/2011  . Symptomatic anemia 06/2015; 08/2015; 03/17/2016    Patient Active Problem List   Diagnosis Date Noted  . Iron deficiency anemia due to chronic blood loss 04/22/2016  . GERD (gastroesophageal reflux disease) 03/18/2016  . Anemia 03/17/2016  . Symptomatic anemia 03/17/2016  . Gout 08/17/2015  . Anemia, iron deficiency 08/17/2015  . GI bleeding 06/15/2015  . Acute blood loss anemia 06/11/2015  . Hypoxemia 06/03/2011  . Fever 06/03/2011  . Obesity 06/03/2011    Past Surgical History:  Procedure Laterality Date  . CERVICAL CONIZATION W/BX  08/2004  . COLONOSCOPY N/A 06/13/2015   Procedure: COLONOSCOPY;  Surgeon: Jeani Hawking, MD;  Location: [facility];  Service: Endoscopy;  Laterality: N/A;  . ESOPHAGOGASTRODUODENOSCOPY N/A 06/13/2015   Procedure: ESOPHAGOGASTRODUODENOSCOPY (EGD)/Colonoscopy;  Surgeon: Jeani Hawking, MD;  Location: [facility];  Service: Endoscopy;  Laterality: N/A;  . GIVENS CAPSULE STUDY N/A 06/14/2015   Procedure: GIVENS CAPSULE STUDY;  Surgeon: Jeani Hawking, MD;  Location: [facility];  Service: Endoscopy;  Laterality: N/A;  . GIVENS CAPSULE STUDY N/A 03/19/2016   Procedure: GIVENS CAPSULE STUDY;  Surgeon: Jeani Hawking, MD;  Location: [facility] ENDOSCOPY;  Service: Endoscopy;  Laterality: N/A;  . GIVENS CAPSULE STUDY N/A 07/05/2017   Procedure: GIVENS CAPSULE STUDY;  Surgeon: Jeani Hawking, MD;  Location: [facility] ENDOSCOPY;  Service: Endoscopy;  Laterality: N/A;  . GIVENS CAPSULE STUDY N/A 02/15/2018   Procedure: GIVENS CAPSULE STUDY;  Surgeon: Jeani Hawking, MD;  Location: [facility] ENDOSCOPY;  Service: Endoscopy;  Laterality: N/A;  . VAGINAL HYSTERECTOMY  ~ 2005    OB History    Gravida  0   Para  0   Term  0   Preterm  0   AB  0   Living        SAB  0   TAB  0   Ectopic  0   Multiple      Live Births               Home Medications    Prior to Admission medications   Medication Sig Start Date End Date Taking? Authorizing Provider  acetaminophen (TYLENOL) 325 MG tablet Take 650 mg by mouth every 6 (six) hours as needed for mild pain.    [provider]  allopurinol (ZYLOPRIM) 300 MG tablet  03/09/19   [provider]  cholecalciferol (VITAMIN D) 1000 units tablet Take 1,000 Units by mouth daily.  [provider]  colchicine 0.6 MG tablet  03/09/19   [provider]  ferrous sulfate 325 (65 FE) MG tablet Take 1 tablet (325 mg total) by mouth 2 (two) times daily with a meal. Patient taking differently: Take 325 mg by mouth at bedtime.  03/20/16   Tyrone Nine, MD  Olopatadine HCl 0.2 % SOLN  01/23/19   [provider]  pantoprazole (PROTONIX) 40 MG tablet Take 1 tablet (40 mg total) by mouth 2 (two) times daily. 02/16/18   Ghimire, Werner Lean, MD  polyethylene glycol (MIRALAX / Ethelene Hal) packet Take 17 g by mouth daily as needed for mild constipation or moderate constipation. 07/06/17   Briant Cedar, MD  triamcinolone cream (KENALOG)  0.5 %  01/23/19   [provider]    Family History Family History  Problem Relation Age of Onset  . COPD Other   . Healthy Mother   . Allergies Father   . Hernia Father     Social History Social History   Tobacco Use  . Smoking status: Never Smoker  . Smokeless tobacco: Never Used  Substance Use Topics  . Alcohol use: No    Comment: Occassional  . Drug use: No     Allergies   Feraheme [ferumoxytol]   Review of Systems Review of Systems  Constitutional: Negative for chills and fever.  HENT: Negative for ear pain and sore throat.   Eyes: Negative for pain and visual disturbance.  Respiratory: Negative for cough and shortness of breath.   Cardiovascular: Negative for chest pain and palpitations.  Gastrointestinal: Negative for abdominal pain and vomiting.  Genitourinary: Negative for dysuria and hematuria.  Musculoskeletal: Negative for arthralgias and back pain.  Skin: Negative for color change and rash.  Neurological: Negative for seizures and syncope.  All other systems reviewed and are negative.    Physical Exam Triage Vital Signs ED Triage Vitals  Enc Vitals Group     BP 07/24/19 1331 132/70     Pulse Rate 07/24/19 1331 74     Resp 07/24/19 1331 18     Temp 07/24/19 1331 99.1 F (37.3 C)     Temp Source 07/24/19 1331 Oral     SpO2 07/24/19 1331 97 %     Weight 07/24/19 1330 272 lb (123.4 kg)     Height --      Head Circumference --      Peak Flow --      Pain Score 07/24/19 1355 0     Pain Loc --      Pain Edu? --      Excl. in GC? --    No data found.  Updated Vital Signs BP 132/70 (BP Location: Left Arm)   Pulse 74   Temp 99.1 F (37.3 C) (Oral)   Resp 18   Wt 272 lb (123.4 kg)   SpO2 97%   BMI 42.60 kg/m   Visual Acuity Right Eye Distance:   Left Eye Distance:   Bilateral Distance:    Right Eye Near:   Left Eye Near:    Bilateral Near:     Physical Exam   UC Treatments / Results  Labs (all labs ordered are  listed, but only abnormal results are displayed) Labs Reviewed  NOVEL CORONAVIRUS, NAA (HOSP ORDER, SEND-OUT TO REF LAB; TAT 18-24 HRS)    EKG   Radiology No results found.  Procedures Procedures (including critical care time)  Medications Ordered in UC Medications - No data  to display  Initial Impression / Assessment and Plan / UC Course  I have reviewed the triage vital signs and the nursing notes.  Pertinent labs & imaging results that were available during my care of the patient were reviewed by me and considered in my medical decision making (see chart for details).     Lab testing for COVID-19 for travel Swab sent for testing Final Clinical Impressions(s) / UC Diagnoses   Final diagnoses:  Encounter for laboratory testing for COVID-19 virus     Discharge Instructions     We will call you if our results are positive    ED Prescriptions    None     PDMP not reviewed this encounter.   Orvan July, NP 07/25/19 [phone]

## 2019-07-26 LAB — NOVEL CORONAVIRUS, NAA (HOSP ORDER, SEND-OUT TO REF LAB; TAT 18-24 HRS): SARS-CoV-2, NAA: NOT DETECTED

## 2019-08-09 IMAGING — DX DG CHEST 1V PORT
1 series · 1 of 1 positions shown · non-contrast
Comparison: 07/04/2017 chest radiograph.

CLINICAL DATA: Dyspnea

EXAM:
PORTABLE CHEST 1 VIEW

[chest ap]
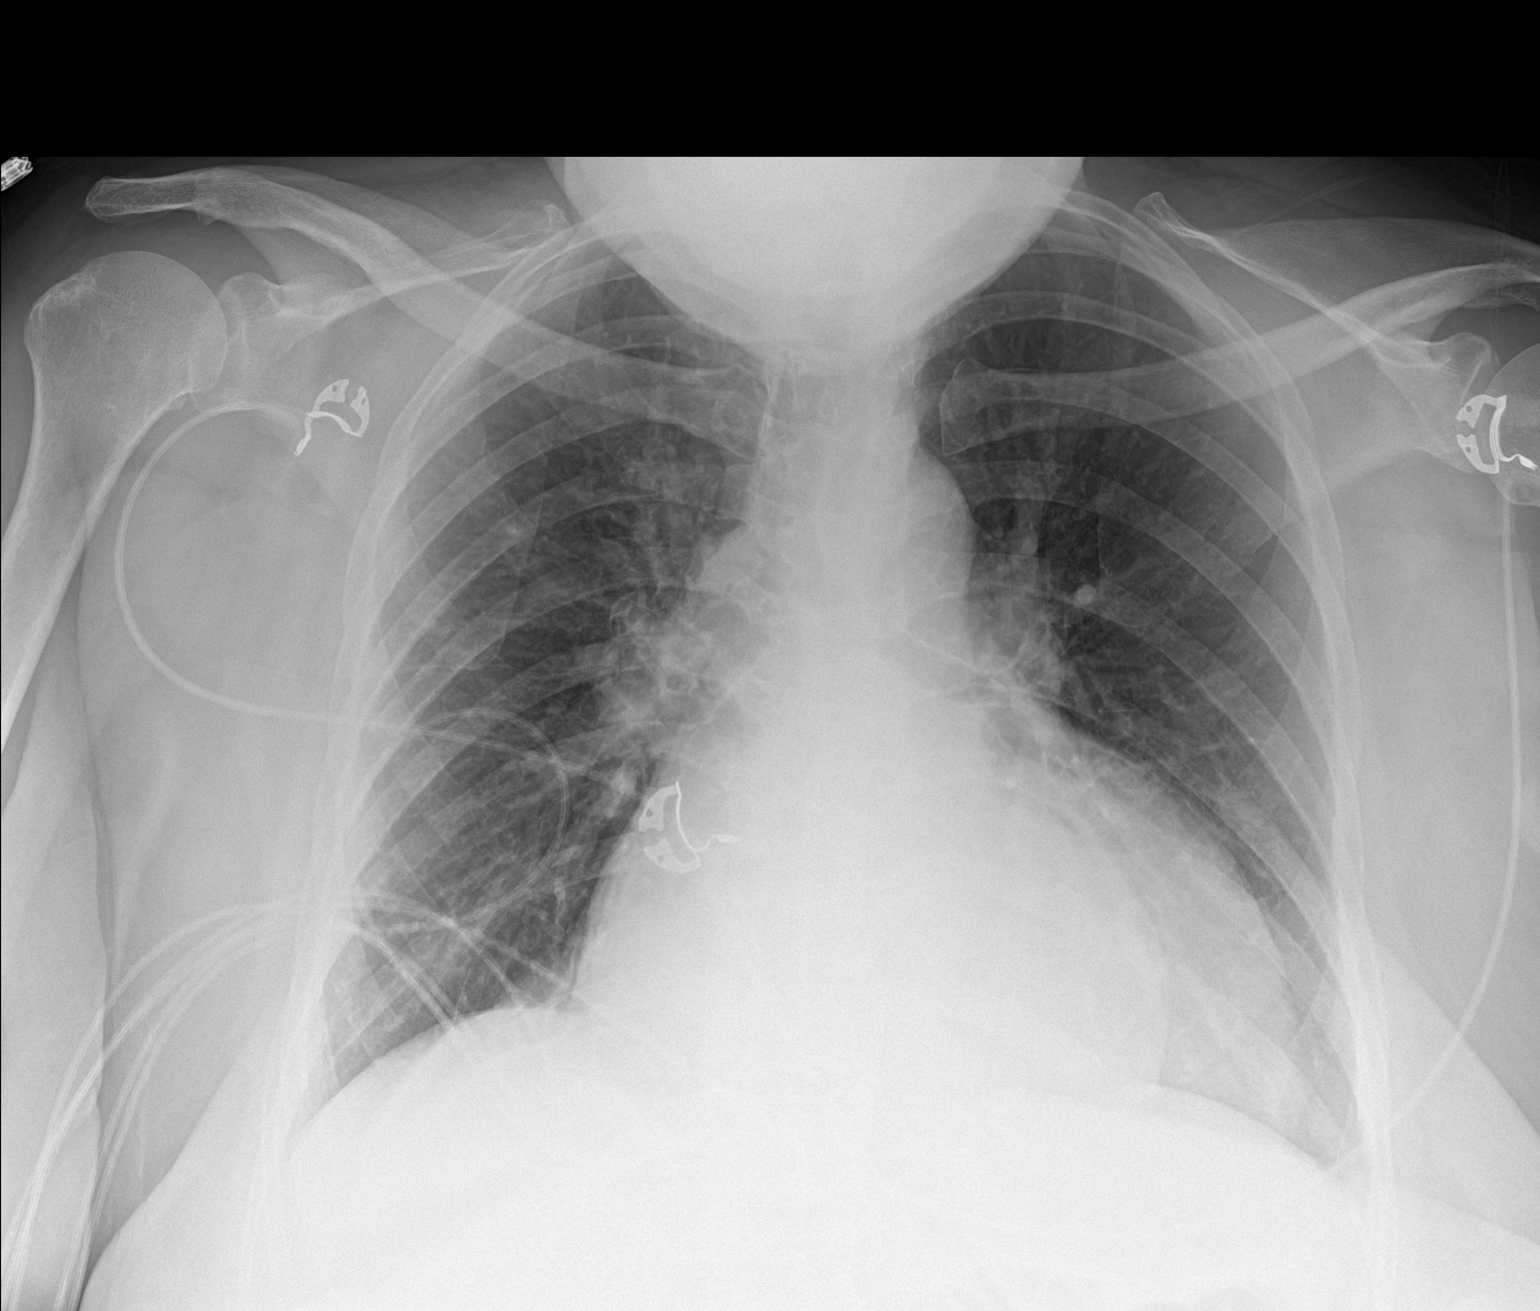

[1 of 1 positions shown; findings below may reference images not displayed]

FINDINGS: Stable cardiomediastinal silhouette with mild cardiomegaly and large
hiatal hernia. No pneumothorax. No pleural effusion. No overt
pulmonary edema. No acute consolidative airspace disease.
IMPRESSION: 1. Stable mild cardiomegaly without overt pulmonary edema.
2. Large hiatal hernia.

## 2019-10-02 ENCOUNTER — Ambulatory Visit: Payer: 59 | Attending: Internal Medicine

## 2019-10-02 DIAGNOSIS — Z23 Encounter for immunization: Secondary | ICD-10-CM

## 2019-10-02 NOTE — Progress Notes (Signed)
   Covid-19 Vaccination Clinic  Name:  Emily Holland    MRN: [id number] DOB: 11-22-1960  10/02/2019  [name] was observed post Covid-19 immunization for 15 minutes without incident. She was provided with Vaccine Information Sheet and instruction to access the V-Safe system.   [name] was instructed to call 911 with any severe reactions post vaccine: [name] Kitchen Difficulty breathing  . Swelling of face and throat  . A fast heartbeat  . A bad rash all over body  . Dizziness and weakness   Immunizations Administered    Name Date Dose VIS Date Route   Pfizer COVID-19 Vaccine 10/02/2019 12:04 PM 0.3 mL 08/02/2018 Intramuscular   Manufacturer: [employer], [employer]   Lot: [id number]   NDC: [address]-1

## 2019-10-23 ENCOUNTER — Ambulatory Visit: Payer: 59 | Attending: Internal Medicine

## 2019-10-23 DIAGNOSIS — Z23 Encounter for immunization: Secondary | ICD-10-CM

## 2019-10-23 NOTE — Progress Notes (Signed)
   Covid-19 Vaccination Clinic  Name:  Emily Holland    MRN: [id number] DOB: 22-Dec-1960  10/23/2019  Ms. Lie was observed post Covid-19 immunization for 15 minutes without incident. She was provided with Vaccine Information Sheet and instruction to access the V-Safe system.   [name] was instructed to call 911 with any severe reactions post vaccine: [name] Kitchen Difficulty breathing  . Swelling of face and throat  . A fast heartbeat  . A bad rash all over body  . Dizziness and weakness   Immunizations Administered    Name Date Dose VIS Date Route   Pfizer COVID-19 Vaccine 10/23/2019  9:54 AM 0.3 mL 08/02/2018 Intramuscular   Manufacturer: [employer], [employer]   Lot: [id number]   NDC: [address]-9

## 2019-11-20 DIAGNOSIS — Z0000 Encounter for general adult medical examination without abnormal findings: Secondary | ICD-10-CM | POA: Diagnosis not present

## 2019-11-20 DIAGNOSIS — M1A9XX0 Chronic gout, unspecified, without tophus (tophi): Secondary | ICD-10-CM | POA: Diagnosis not present

## 2019-11-20 DIAGNOSIS — J302 Other seasonal allergic rhinitis: Secondary | ICD-10-CM | POA: Diagnosis not present

## 2019-11-20 DIAGNOSIS — Z13220 Encounter for screening for lipoid disorders: Secondary | ICD-10-CM | POA: Diagnosis not present

## 2019-11-20 DIAGNOSIS — R7303 Prediabetes: Secondary | ICD-10-CM | POA: Diagnosis not present

## 2019-11-20 MED FILL — ALLOPURINOL 300 MG TABS: 300 | 30 days supply | Qty: 30 | Fill #0

## 2019-11-20 MED FILL — OLOPATADINE HCL 0.2 % SOLN: 0.2 | 30 days supply | Qty: 3 | Fill #0

## 2019-11-20 MED FILL — FLUTICASONE PROP 50 MCG SPR: 50 | 30 days supply | Qty: 16 | Fill #0

## 2019-11-20 MED FILL — COLCHICINE 0.6 MG TABS: 0.6 | 30 days supply | Qty: 30 | Fill #0

## 2019-11-24 DIAGNOSIS — Z0000 Encounter for general adult medical examination without abnormal findings: Secondary | ICD-10-CM | POA: Diagnosis not present

## 2019-11-24 DIAGNOSIS — E559 Vitamin D deficiency, unspecified: Secondary | ICD-10-CM | POA: Diagnosis not present

## 2019-11-24 DIAGNOSIS — M1A9XX0 Chronic gout, unspecified, without tophus (tophi): Secondary | ICD-10-CM | POA: Diagnosis not present

## 2019-11-24 DIAGNOSIS — Z13220 Encounter for screening for lipoid disorders: Secondary | ICD-10-CM | POA: Diagnosis not present

## 2019-11-24 DIAGNOSIS — R7303 Prediabetes: Secondary | ICD-10-CM | POA: Diagnosis not present

## 2019-12-14 ENCOUNTER — Encounter (HOSPITAL_COMMUNITY): Payer: Self-pay

## 2019-12-14 ENCOUNTER — Ambulatory Visit (HOSPITAL_COMMUNITY)
Admission: EM | Admit: 2019-12-14 | Discharge: 2019-12-14 | Disposition: A | Discharge status: Home / Self Care | Payer: 59 | Attending: Physician Assistant | Admitting: Physician Assistant

## 2019-12-14 ENCOUNTER — Ambulatory Visit (INDEPENDENT_AMBULATORY_CARE_PROVIDER_SITE_OTHER): Payer: 59

## 2019-12-14 ENCOUNTER — Other Ambulatory Visit: Payer: Self-pay

## 2019-12-14 DIAGNOSIS — M1990 Unspecified osteoarthritis, unspecified site: Secondary | ICD-10-CM | POA: Diagnosis not present

## 2019-12-14 DIAGNOSIS — J0110 Acute frontal sinusitis, unspecified: Secondary | ICD-10-CM | POA: Diagnosis not present

## 2019-12-14 DIAGNOSIS — M791 Myalgia, unspecified site: Secondary | ICD-10-CM | POA: Insufficient documentation

## 2019-12-14 DIAGNOSIS — Z79899 Other long term (current) drug therapy: Secondary | ICD-10-CM | POA: Diagnosis not present

## 2019-12-14 DIAGNOSIS — J90 Pleural effusion, not elsewhere classified: Secondary | ICD-10-CM | POA: Diagnosis not present

## 2019-12-14 DIAGNOSIS — K219 Gastro-esophageal reflux disease without esophagitis: Secondary | ICD-10-CM | POA: Insufficient documentation

## 2019-12-14 DIAGNOSIS — J40 Bronchitis, not specified as acute or chronic: Secondary | ICD-10-CM | POA: Diagnosis not present

## 2019-12-14 DIAGNOSIS — E669 Obesity, unspecified: Secondary | ICD-10-CM | POA: Diagnosis not present

## 2019-12-14 DIAGNOSIS — Z6841 Body Mass Index (BMI) 40.0 and over, adult: Secondary | ICD-10-CM | POA: Insufficient documentation

## 2019-12-14 DIAGNOSIS — R05 Cough: Secondary | ICD-10-CM | POA: Diagnosis not present

## 2019-12-14 DIAGNOSIS — K449 Diaphragmatic hernia without obstruction or gangrene: Secondary | ICD-10-CM | POA: Diagnosis not present

## 2019-12-14 DIAGNOSIS — Z20822 Contact with and (suspected) exposure to covid-19: Secondary | ICD-10-CM | POA: Insufficient documentation

## 2019-12-14 LAB — SARS CORONAVIRUS 2 (TAT 6-24 HRS): SARS Coronavirus 2: NEGATIVE

## 2019-12-14 MED ORDER — AMOXICILLIN-POT CLAVULANATE 875-125 MG PO TABS: 1.0000 | ORAL_TABLET | Freq: Two times a day (BID) | ORAL | 0 refills | Status: AC

## 2019-12-14 MED ORDER — BENZONATATE 100 MG PO CAPS: 100.0000 mg | ORAL_CAPSULE | Freq: Three times a day (TID) | ORAL | 0 refills | Status: DC

## 2019-12-14 MED ORDER — ALBUTEROL SULFATE HFA 108 (90 BASE) MCG/ACT IN AERS: 1.0000 | INHALATION_SPRAY | Freq: Four times a day (QID) | RESPIRATORY_TRACT | 0 refills | Status: DC | PRN

## 2019-12-14 MED ORDER — CEPACOL SORE THROAT 5.4 MG MT LOZG
1.0000 | LOZENGE | OROMUCOSAL | 0 refills | Status: AC | PRN
Start: 2019-12-14 — End: ?

## 2019-12-14 MED ORDER — FLUTICASONE PROPIONATE 50 MCG/ACT NA SUSP: 1.0000 | Freq: Every day | NASAL | 0 refills | Status: DC

## 2019-12-14 MED ORDER — ACETAMINOPHEN 325 MG PO TABS
650.0000 mg | ORAL_TABLET | Freq: Four times a day (QID) | ORAL | 0 refills | Status: AC | PRN
Start: 2019-12-14 — End: ?

## 2019-12-14 MED FILL — FLUTICASONE PROP 50 MCG SPR: 50 | 60 days supply | Qty: 16 | Fill #0

## 2019-12-14 MED FILL — AMOX-CLAV 875-125 MG TABLET: 875-125 | 10 days supply | Qty: 20 | Fill #0

## 2019-12-14 MED FILL — BENZONATATE 100 MG CAPS: 100 | 7 days supply | Qty: 21 | Fill #0

## 2019-12-14 MED FILL — ALBUTEROL SULFATE HFA 108 (: 108 (90 BAS | 25 days supply | Qty: 18 | Fill #0

## 2019-12-14 NOTE — Discharge Instructions (Addendum)
Take the augmentin 2 times a day for 10 days Take the tessalon cough medicine up to ever 8 hours for cough Take tylenol every 6 hours as needed for sore throat or bodyache Use cepacol lozenge for sore throat Use flonase daily  If not improving over the next 3-4 days return, if you are worsening and have any shortness of breath or high fever return or report to the Emergency Department  Schedule follow up with your primary care in 1 week   If your Covid-19 test is positive, you will receive a phone call from [facility] regarding your results. Negative test results are not called. Both positive and negative results area always visible on MyChart. If you do not have a MyChart account, sign up instructions are in your discharge papers.   Persons who are directed to care for themselves at home may discontinue isolation under the following conditions:   At least 10 days have passed since symptom onset and  At least 24 hours have passed without running a fever (this means without the use of fever-reducing medications) and  Other symptoms have improved.  Persons infected with COVID-19 who never develop symptoms may discontinue isolation and other precautions 10 days after the date of their first positive COVID-19 test.

## 2019-12-14 NOTE — ED Provider Notes (Signed)
[facility]    CSN: [id number] Arrival date & time: 12/14/19  1102      History   Chief Complaint Chief Complaint  Patient presents with  . Cough    HPI Emina Ribaudo is a 59 y.o. female.   Patient reports urgent care for evaluation of cough and nasal congestion for the last 6 days.  She reports started with a slight cough 6 days ago that has developed some white phlegm.  She denies shortness of breath.  She does report some chest burning with cough.  Cough is worse at night.  She reports over the last few days she has developed a lot of nasal congestion with runny nose.  She has had some mild facial pain.  She reports no ear pain or ear pressure.  She denies fever but endorses some" cold feel in her body" that occurred yesterday.  She reports the symptoms have been intermittent at times and she would feel like she is getting better and then throughout the day with start to feel worse.  She reports she came in today because she had worsening nasal congestion and a" bitter taste in her mouth" and she felt like she should be evaluated.  Patient is a non-smoker and reports no baseline issues with her lungs.     Past Medical History:  Diagnosis Date  . Arthritis    "bones; joints; legs; ankles" (03/17/2016)  . GERD (gastroesophageal reflux disease)   . Gout   . History of blood transfusion 06/2015; 08/2015; 03/17/2016  . History of iron deficiency anemia    took iron supplements 06/2015-3/ 2017; stopped/dr's order (03/17/2016)  . Iron deficiency anemia due to chronic blood loss 07/04/2017  . Low hemoglobin 06/2015; 08/2015; 03/17/2016  . Shortness of breath on exertion 06/03/2011  . Symptomatic anemia 06/2015; 08/2015; 03/17/2016    Patient Active Problem List   Diagnosis Date Noted  . Iron deficiency anemia due to chronic blood loss 04/22/2016  . GERD (gastroesophageal reflux disease) 03/18/2016  . Anemia 03/17/2016  . Symptomatic anemia 03/17/2016  . Gout  08/17/2015  . Anemia, iron deficiency 08/17/2015  . GI bleeding 06/15/2015  . Acute blood loss anemia 06/11/2015  . Hypoxemia 06/03/2011  . Fever 06/03/2011  . Obesity 06/03/2011    Past Surgical History:  Procedure Laterality Date  . CERVICAL CONIZATION W/BX  08/2004  . COLONOSCOPY N/A 06/13/2015   Procedure: COLONOSCOPY;  Surgeon: Jeani Hawking, MD;  Location: [facility];  Service: Endoscopy;  Laterality: N/A;  . ESOPHAGOGASTRODUODENOSCOPY N/A 06/13/2015   Procedure: ESOPHAGOGASTRODUODENOSCOPY (EGD)/Colonoscopy;  Surgeon: Jeani Hawking, MD;  Location: [facility];  Service: Endoscopy;  Laterality: N/A;  . GIVENS CAPSULE STUDY N/A 06/14/2015   Procedure: GIVENS CAPSULE STUDY;  Surgeon: Jeani Hawking, MD;  Location: [facility];  Service: Endoscopy;  Laterality: N/A;  . GIVENS CAPSULE STUDY N/A 03/19/2016   Procedure: GIVENS CAPSULE STUDY;  Surgeon: Jeani Hawking, MD;  Location: [facility] ENDOSCOPY;  Service: Endoscopy;  Laterality: N/A;  . GIVENS CAPSULE STUDY N/A 07/05/2017   Procedure: GIVENS CAPSULE STUDY;  Surgeon: Jeani Hawking, MD;  Location: [facility] ENDOSCOPY;  Service: Endoscopy;  Laterality: N/A;  . GIVENS CAPSULE STUDY N/A 02/15/2018   Procedure: GIVENS CAPSULE STUDY;  Surgeon: Jeani Hawking, MD;  Location: [facility] ENDOSCOPY;  Service: Endoscopy;  Laterality: N/A;  . VAGINAL HYSTERECTOMY  ~ 2005    OB History    Gravida  0   Para  0   Term  0   Preterm  0  AB  0   Living        SAB  0   TAB  0   Ectopic  0   Multiple      Live Births               Home Medications    Prior to Admission medications   Medication Sig Start Date End Date Taking? Authorizing Provider  dextromethorphan 15 MG/5ML syrup Take 10 mLs by mouth 4 (four) times daily as needed for cough.   Yes [provider]  guaiFENesin (MUCINEX) 600 MG 12 hr tablet Take by mouth 2 (two) times daily.   Yes [provider]  acetaminophen (TYLENOL) 325 MG tablet Take 2 tablets (650 mg total) by  mouth every 6 (six) hours as needed. 12/14/19   Adric Wrede, Veryl Speak, PA-C  albuterol (VENTOLIN HFA) 108 (90 Base) MCG/ACT inhaler Inhale 1-2 puffs into the lungs every 6 (six) hours as needed for wheezing or shortness of breath. 12/14/19   Fremon Zacharia, Veryl Speak, PA-C  allopurinol (ZYLOPRIM) 300 MG tablet  03/09/19   [provider]  amoxicillin-clavulanate (AUGMENTIN) 875-125 MG tablet Take 1 tablet by mouth 2 (two) times daily for 10 days. 12/14/19 12/24/19  Kaile Bixler, Veryl Speak, PA-C  benzonatate (TESSALON) 100 MG capsule Take 1 capsule (100 mg total) by mouth every 8 (eight) hours. 12/14/19   Austine Kelsay, Veryl Speak, PA-C  cholecalciferol (VITAMIN D) 1000 units tablet Take 1,000 Units by mouth daily.    [provider]  colchicine 0.6 MG tablet  03/09/19   [provider]  ferrous sulfate 325 (65 FE) MG tablet Take 1 tablet (325 mg total) by mouth 2 (two) times daily with a meal. Patient taking differently: Take 325 mg by mouth at bedtime.  03/20/16   Tyrone Nine, MD  fluticasone (FLONASE) 50 MCG/ACT nasal spray Place 1 spray into both nostrils daily. 12/14/19   Keiry Kowal, Veryl Speak, PA-C  Menthol (CEPACOL SORE THROAT) 5.4 MG LOZG Use as directed 1 lozenge (5.4 mg total) in the mouth or throat every 2 (two) hours as needed. 12/14/19   Veronica Fretz, Veryl Speak, PA-C  Olopatadine HCl 0.2 % SOLN  01/23/19   [provider]  pantoprazole (PROTONIX) 40 MG tablet Take 1 tablet (40 mg total) by mouth 2 (two) times daily. 02/16/18   Ghimire, Werner Lean, MD  polyethylene glycol (MIRALAX / Ethelene Hal) packet Take 17 g by mouth daily as needed for mild constipation or moderate constipation. 07/06/17   Briant Cedar, MD  triamcinolone cream (KENALOG) 0.5 %  01/23/19   [provider]    Family History Family History  Problem Relation Age of Onset  . COPD Other   . Healthy Mother   . Allergies Father   . Hernia Father     Social History Social History   Tobacco Use  . Smoking status: Never Smoker  . Smokeless  tobacco: Never Used  Vaping Use  . Vaping Use: Never used  Substance Use Topics  . Alcohol use: Yes    Comment: Occassional  . Drug use: No     Allergies   Feraheme [ferumoxytol]   Review of Systems Review of Systems   Physical Exam Triage Vital Signs ED Triage Vitals [12/14/19 1232]  Enc Vitals Group     BP (!) 151/74     Pulse Rate 81     Resp 16     Temp 98.1 F (36.7 C)     Temp Source Oral  SpO2 96 %     Weight 267 lb (121.1 kg)     Height 5\' 8"  (1.727 m)     Head Circumference      Peak Flow      Pain Score 0     Pain Loc      Pain Edu?      Excl. in GC?    No data found.  Updated Vital Signs BP (!) 151/74   Pulse 81   Temp 98.1 F (36.7 C) (Oral)   Resp 16   Ht 5\' 8"  (1.727 m)   Wt 267 lb (121.1 kg)   SpO2 96%   BMI 40.60 kg/m   Visual Acuity Right Eye Distance:   Left Eye Distance:   Bilateral Distance:    Right Eye Near:   Left Eye Near:    Bilateral Near:     Physical Exam Vitals and nursing note reviewed.  Constitutional:      General: She is not in acute distress.    Appearance: She is well-developed. She is not ill-appearing.  HENT:     Head: Normocephalic and atraumatic.     Comments: maxiallary TTP    Nose:     Comments: Bilateral swollen turbinates with discharge Eyes:     Conjunctiva/sclera: Conjunctivae normal.  Cardiovascular:     Rate and Rhythm: Normal rate and regular rhythm.     Heart sounds: No murmur heard.   Pulmonary:     Effort: Pulmonary effort is normal. No respiratory distress.     Comments: Patient speaking in full sentences and saturating 96% on room air  Patient has intermittent expiratory wheeze in the right lower field with rhonchorous sounds.  No clear rales.  Does not clear with a cough.  Mild rhonchi in left lower.  Does not clear with cough.  Other fields to auscultation throughout without wheeze Abdominal:     Palpations: Abdomen is soft.     Tenderness: There is no abdominal tenderness.   Musculoskeletal:     Cervical back: Neck supple.  Skin:    General: Skin is warm and dry.  Neurological:     Mental Status: She is alert.      UC Treatments / Results  Labs (all labs ordered are listed, but only abnormal results are displayed) Labs Reviewed  SARS CORONAVIRUS 2 (TAT 6-24 HRS)    EKG   Radiology DG Chest 2 View  Result Date: 12/14/2019 CLINICAL DATA:  Productive cough. EXAM: CHEST - 2 VIEW COMPARISON:  February 15, 2018. FINDINGS: Stable cardiomediastinal silhouette. Stable large hiatal hernia. No pneumothorax or pleural effusion is noted. Lungs are clear. Bony thorax is unremarkable. IMPRESSION: No active cardiopulmonary disease. Electronically Signed   By: Lupita RaiderJames  Green Jr M.D.   On: 12/14/2019 13:32    Procedures Procedures (including critical care time)  Medications Ordered in UC Medications - No data to display  Initial Impression / Assessment and Plan / UC Course  I have reviewed the triage vital signs and the nursing notes.  Pertinent labs & imaging results that were available during my care of the patient were reviewed by me and considered in my medical decision making (see chart for details).     #Maxillary sinusitis #bronchitis Patient is a 59 year old female presenting with bronchitis and a maxillary sinusitis. Afebrile with reassuring vital signs. No pneumonia on chest xray. Given acutely worsening nasal symptoms and persistent cough, will treat with augmentin. Given there was some rhonchi and wheeze, albuterol sent  as needed though no SOB today. Tessalon for cough. Instructed to follow up closely with PCP. Strict emergency department and return precautions discussed. Final Clinical Impressions(s) / UC Diagnoses   Final diagnoses:  Acute frontal sinusitis, recurrence not specified  Bronchitis     Discharge Instructions     Take the augmentin 2 times a day for 10 days Take the tessalon cough medicine up to ever 8 hours for cough Take  tylenol every 6 hours as needed for sore throat or bodyache Use cepacol lozenge for sore throat Use flonase daily  If not improving over the next 3-4 days return, if you are worsening and have any shortness of breath or high fever return or report to the Emergency Department  Schedule follow up with your primary care in 1 week   If your Covid-19 test is positive, you will receive a phone call from [facility] regarding your results. Negative test results are not called. Both positive and negative results area always visible on MyChart. If you do not have a MyChart account, sign up instructions are in your discharge papers.   Persons who are directed to care for themselves at home may discontinue isolation under the following conditions:  . At least 10 days have passed since symptom onset and . At least 24 hours have passed without running a fever (this means without the use of fever-reducing medications) and . Other symptoms have improved.  Persons infected with COVID-19 who never develop symptoms may discontinue isolation and other precautions 10 days after the date of their first positive COVID-19 test.       ED Prescriptions    Medication Sig Dispense Auth. Provider   amoxicillin-clavulanate (AUGMENTIN) 875-125 MG tablet Take 1 tablet by mouth 2 (two) times daily for 10 days. 20 tablet Clinton Wahlberg, Veryl Speak, PA-C   albuterol (VENTOLIN HFA) 108 (90 Base) MCG/ACT inhaler Inhale 1-2 puffs into the lungs every 6 (six) hours as needed for wheezing or shortness of breath. 8 g Keishawn Rajewski, Veryl Speak, PA-C   benzonatate (TESSALON) 100 MG capsule Take 1 capsule (100 mg total) by mouth every 8 (eight) hours. 21 capsule Shawanda Sievert, Veryl Speak, PA-C   Menthol (CEPACOL SORE THROAT) 5.4 MG LOZG Use as directed 1 lozenge (5.4 mg total) in the mouth or throat every 2 (two) hours as needed. 30 lozenge Orazio Weller, Veryl Speak, PA-C   acetaminophen (TYLENOL) 325 MG tablet Take 2 tablets (650 mg total) by mouth every 6 (six) hours as  needed. 30 tablet Immaculate Crutcher, Veryl Speak, PA-C   fluticasone (FLONASE) 50 MCG/ACT nasal spray Place 1 spray into both nostrils daily. 11.1 mL Ronnika Collett, Veryl Speak, PA-C     PDMP not reviewed this encounter.   Hermelinda Medicus, PA-C 12/14/19 2311

## 2019-12-14 NOTE — ED Triage Notes (Addendum)
Pt c/o productive cough w/white phlegm, chest congestion, nasal drainagex6 days. Pt denies SOB and any other symptoms.

## 2019-12-19 DIAGNOSIS — J302 Other seasonal allergic rhinitis: Secondary | ICD-10-CM | POA: Diagnosis not present

## 2019-12-19 DIAGNOSIS — M1A9XX0 Chronic gout, unspecified, without tophus (tophi): Secondary | ICD-10-CM | POA: Diagnosis not present

## 2019-12-19 DIAGNOSIS — J069 Acute upper respiratory infection, unspecified: Secondary | ICD-10-CM | POA: Diagnosis not present

## 2019-12-19 MED FILL — predniSONE 20 MG TABS: 20 | 7 days supply | Qty: 10 | Fill #0

## 2020-01-08 DIAGNOSIS — M1A9XX0 Chronic gout, unspecified, without tophus (tophi): Secondary | ICD-10-CM | POA: Diagnosis not present

## 2020-01-08 DIAGNOSIS — R7303 Prediabetes: Secondary | ICD-10-CM | POA: Diagnosis not present

## 2020-01-08 DIAGNOSIS — K5900 Constipation, unspecified: Secondary | ICD-10-CM | POA: Diagnosis not present

## 2020-01-08 DIAGNOSIS — J302 Other seasonal allergic rhinitis: Secondary | ICD-10-CM | POA: Diagnosis not present

## 2020-01-08 DIAGNOSIS — K30 Functional dyspepsia: Secondary | ICD-10-CM | POA: Diagnosis not present

## 2020-01-08 MED FILL — PANTOPRAZOLE SOD DR 40 MG T: 40 | 30 days supply | Qty: 30 | Fill #0

## 2020-02-28 DIAGNOSIS — H11153 Pinguecula, bilateral: Secondary | ICD-10-CM | POA: Diagnosis not present

## 2020-02-28 DIAGNOSIS — H04123 Dry eye syndrome of bilateral lacrimal glands: Secondary | ICD-10-CM | POA: Diagnosis not present

## 2020-02-28 DIAGNOSIS — I10 Essential (primary) hypertension: Secondary | ICD-10-CM | POA: Diagnosis not present

## 2020-02-28 DIAGNOSIS — E119 Type 2 diabetes mellitus without complications: Secondary | ICD-10-CM | POA: Diagnosis not present

## 2020-02-28 DIAGNOSIS — H35363 Drusen (degenerative) of macula, bilateral: Secondary | ICD-10-CM | POA: Diagnosis not present

## 2020-02-28 DIAGNOSIS — H353131 Nonexudative age-related macular degeneration, bilateral, early dry stage: Secondary | ICD-10-CM | POA: Diagnosis not present

## 2020-02-28 DIAGNOSIS — H524 Presbyopia: Secondary | ICD-10-CM | POA: Diagnosis not present

## 2020-02-28 DIAGNOSIS — H5202 Hypermetropia, left eye: Secondary | ICD-10-CM | POA: Diagnosis not present

## 2020-03-15 ENCOUNTER — Other Ambulatory Visit: Payer: Self-pay | Admitting: Internal Medicine

## 2020-03-15 DIAGNOSIS — Z1231 Encounter for screening mammogram for malignant neoplasm of breast: Secondary | ICD-10-CM

## 2020-04-10 ENCOUNTER — Other Ambulatory Visit: Payer: Self-pay

## 2020-04-10 ENCOUNTER — Ambulatory Visit
Admission: RE | Admit: 2020-04-10 | Discharge: 2020-04-10 | Disposition: A | Discharge status: Home / Self Care | Payer: 59 | Source: Ambulatory Visit | Attending: Internal Medicine | Admitting: Internal Medicine

## 2020-04-10 DIAGNOSIS — Z1231 Encounter for screening mammogram for malignant neoplasm of breast: Secondary | ICD-10-CM | POA: Diagnosis not present

## 2020-04-11 ENCOUNTER — Other Ambulatory Visit (HOSPITAL_COMMUNITY): Payer: Self-pay | Admitting: Gastroenterology

## 2020-04-11 DIAGNOSIS — D509 Iron deficiency anemia, unspecified: Secondary | ICD-10-CM | POA: Diagnosis not present

## 2020-04-11 DIAGNOSIS — Z1211 Encounter for screening for malignant neoplasm of colon: Secondary | ICD-10-CM | POA: Diagnosis not present

## 2020-04-11 MED FILL — SUPREP BOWEL PREP KIT: 17.5-3.13-1 | 1 days supply | Qty: 354 | Fill #0

## 2020-05-16 DIAGNOSIS — Z1211 Encounter for screening for malignant neoplasm of colon: Secondary | ICD-10-CM | POA: Diagnosis not present

## 2020-06-17 ENCOUNTER — Other Ambulatory Visit (HOSPITAL_COMMUNITY): Payer: Self-pay | Admitting: Internal Medicine

## 2020-06-17 DIAGNOSIS — M1A9XX0 Chronic gout, unspecified, without tophus (tophi): Secondary | ICD-10-CM | POA: Diagnosis not present

## 2020-06-17 DIAGNOSIS — K219 Gastro-esophageal reflux disease without esophagitis: Secondary | ICD-10-CM | POA: Diagnosis not present

## 2020-06-17 DIAGNOSIS — J302 Other seasonal allergic rhinitis: Secondary | ICD-10-CM | POA: Diagnosis not present

## 2020-06-17 DIAGNOSIS — J209 Acute bronchitis, unspecified: Secondary | ICD-10-CM | POA: Diagnosis not present

## 2020-06-17 MED FILL — COLCHICINE 0.6 MG TABS: 0.6 | 30 days supply | Qty: 30 | Fill #0

## 2020-06-17 MED FILL — ALLOPURINOL 300 MG TABS: 300 | 30 days supply | Qty: 30 | Fill #0

## 2020-06-17 MED FILL — FLUTICASONE PROP 50 MCG SPR: 50 | 30 days supply | Qty: 16 | Fill #0

## 2020-06-17 MED FILL — OLOPATADINE HCL 0.2 % SOLN: 0.2 | 30 days supply | Qty: 3 | Fill #0

## 2020-06-17 MED FILL — NOREL AD TABLET: 4-10-325 | 10 days supply | Qty: 20 | Fill #0

## 2020-06-17 MED FILL — PANTOPRAZOLE SOD DR 40 MG T: 40 | 30 days supply | Qty: 30 | Fill #0

## 2020-06-17 MED FILL — AZITHROMYCIN 250 MG TABLET: 250 | 5 days supply | Qty: 6 | Fill #0

## 2020-06-17 MED FILL — predniSONE 20 MG TABS: 20 | 7 days supply | Qty: 10 | Fill #0

## 2020-07-08 ENCOUNTER — Other Ambulatory Visit (HOSPITAL_COMMUNITY): Payer: Self-pay | Admitting: Internal Medicine

## 2020-07-08 DIAGNOSIS — J302 Other seasonal allergic rhinitis: Secondary | ICD-10-CM | POA: Diagnosis not present

## 2020-07-08 DIAGNOSIS — M19012 Primary osteoarthritis, left shoulder: Secondary | ICD-10-CM | POA: Diagnosis not present

## 2020-07-08 DIAGNOSIS — R7303 Prediabetes: Secondary | ICD-10-CM | POA: Diagnosis not present

## 2020-07-08 DIAGNOSIS — M1A9XX0 Chronic gout, unspecified, without tophus (tophi): Secondary | ICD-10-CM | POA: Diagnosis not present

## 2020-07-08 DIAGNOSIS — J209 Acute bronchitis, unspecified: Secondary | ICD-10-CM | POA: Diagnosis not present

## 2020-07-08 MED FILL — DICLOFENAC SODIUM 1 % GEL: 1 | 30 days supply | Qty: 500 | Fill #0

## 2020-07-08 MED FILL — ALBUTEROL SULFATE HFA 108 (: 108 (90 BAS | 25 days supply | Qty: 18 | Fill #0

## 2020-11-21 ENCOUNTER — Other Ambulatory Visit: Payer: Self-pay | Admitting: Internal Medicine

## 2020-11-21 DIAGNOSIS — E2839 Other primary ovarian failure: Secondary | ICD-10-CM

## 2020-12-16 ENCOUNTER — Encounter: Payer: Self-pay | Admitting: Hematology

## 2020-12-22 ENCOUNTER — Encounter: Payer: Self-pay | Admitting: Hematology

## 2020-12-23 ENCOUNTER — Other Ambulatory Visit: Payer: Self-pay | Admitting: Obstetrics and Gynecology

## 2020-12-23 ENCOUNTER — Ambulatory Visit (INDEPENDENT_AMBULATORY_CARE_PROVIDER_SITE_OTHER): Payer: Medicare HMO | Admitting: Obstetrics and Gynecology

## 2020-12-23 ENCOUNTER — Encounter: Payer: Self-pay | Admitting: Obstetrics and Gynecology

## 2020-12-23 ENCOUNTER — Other Ambulatory Visit: Payer: Self-pay

## 2020-12-23 VITALS — BP 136/84 | HR 59 | Ht 67.0 in | Wt 267.5 lb

## 2020-12-23 DIAGNOSIS — R300 Dysuria: Secondary | ICD-10-CM | POA: Diagnosis not present

## 2020-12-23 DIAGNOSIS — Z01419 Encounter for gynecological examination (general) (routine) without abnormal findings: Secondary | ICD-10-CM | POA: Diagnosis not present

## 2020-12-23 LAB — POCT URINALYSIS DIPSTICK
Bilirubin, UA: NEGATIVE
Blood, UA: NEGATIVE
Glucose, UA: NEGATIVE
Ketones, UA: NEGATIVE
Leukocytes, UA: NEGATIVE
Nitrite, UA: NEGATIVE
Protein, UA: NEGATIVE
Spec Grav, UA: 1.02 (ref 1.010–1.025)
Urobilinogen, UA: 0.2 E.U./dL
pH, UA: 6 (ref 5.0–8.0)

## 2020-12-23 NOTE — Progress Notes (Signed)
Patient presents as a New Patient to establish care. Patient states is here to establish care. Patient states that she is still having some burning with urination. She just completed antibiotics for this a week and a half ago. She has no other concerns.  Last MM 04/2020

## 2020-12-23 NOTE — Progress Notes (Signed)
Subjective:     Emily Holland is a 60 y.o. female postmenopausal with BMI 41 who is here for a comprehensive physical exam. The patient reports no problems. She denies any pelvic pain or abnormal discharge. She denies urinary incontinence. Patient had a vaginal hysterectomy in [address] and denies any episodes of vaginal bleeding. She is occasionally sexually active without complaints. Patient recently completed a course of antibiotic for UTI and desires test of cure  Past Medical History:  Diagnosis Date   Arthritis    "bones; joints; legs; ankles" (03/17/2016)   GERD (gastroesophageal reflux disease)    Gout    History of blood transfusion 06/2015; 08/2015; 03/17/2016   History of iron deficiency anemia    took iron supplements 06/2015-3/ 2017; stopped/dr's order (03/17/2016)   Iron deficiency anemia due to chronic blood loss 07/04/2017   Low hemoglobin 06/2015; 08/2015; 03/17/2016   Shortness of breath on exertion 06/03/2011   Symptomatic anemia 06/2015; 08/2015; 03/17/2016   Past Surgical History:  Procedure Laterality Date   CERVICAL CONIZATION W/BX  08/2004   COLONOSCOPY N/A 06/13/2015   Procedure: COLONOSCOPY;  Surgeon: Jeani Hawking, MD;  Location: [facility] ENDOSCOPY;  Service: Endoscopy;  Laterality: N/A;   ESOPHAGOGASTRODUODENOSCOPY N/A 06/13/2015   Procedure: ESOPHAGOGASTRODUODENOSCOPY (EGD)/Colonoscopy;  Surgeon: Jeani Hawking, MD;  Location: [facility];  Service: Endoscopy;  Laterality: N/A;   GIVENS CAPSULE STUDY N/A 06/14/2015   Procedure: GIVENS CAPSULE STUDY;  Surgeon: Jeani Hawking, MD;  Location: [facility];  Service: Endoscopy;  Laterality: N/A;   GIVENS CAPSULE STUDY N/A 03/19/2016   Procedure: GIVENS CAPSULE STUDY;  Surgeon: Jeani Hawking, MD;  Location: [facility] ENDOSCOPY;  Service: Endoscopy;  Laterality: N/A;   GIVENS CAPSULE STUDY N/A 07/05/2017   Procedure: GIVENS CAPSULE STUDY;  Surgeon: Jeani Hawking, MD;  Location: [facility] ENDOSCOPY;  Service: Endoscopy;  Laterality: N/A;   GIVENS  CAPSULE STUDY N/A 02/15/2018   Procedure: GIVENS CAPSULE STUDY;  Surgeon: Jeani Hawking, MD;  Location: [facility] ENDOSCOPY;  Service: Endoscopy;  Laterality: N/A;   VAGINAL HYSTERECTOMY  ~ 2005   Family History  Problem Relation Age of Onset   COPD Other    Healthy Mother    Allergies Father    Hernia Father      Social History   Socioeconomic History   Marital status: Married    Spouse name: Not on file   Number of children: Not on file   Years of education: Not on file   Highest education level: Not on file  Occupational History   Not on file  Tobacco Use   Smoking status: Never   Smokeless tobacco: Never  Vaping Use   Vaping Use: Never used  Substance and Sexual Activity   Alcohol use: Yes    Comment: Occassional   Drug use: No   Sexual activity: Not Currently  Other Topics Concern   Not on file  Social History Narrative   ** Merged History Encounter **       Social Determinants of Health   Financial Resource Strain: Not on file  Food Insecurity: Not on file  Transportation Needs: Not on file  Physical Activity: Not on file  Stress: Not on file  Social Connections: Not on file  Intimate Partner Violence: Not on file   Health Maintenance  Topic Date Due   URINE MICROALBUMIN  Never done   Hepatitis C Screening  Never done   TETANUS/TDAP  Never done   PAP SMEAR-Modifier  Never done   Zoster Vaccines- Shingrix (1  of 2) Never done   COVID-19 Vaccine (3 - Booster for Pfizer series) 03/24/2020   INFLUENZA VACCINE  01/06/2021   MAMMOGRAM  04/10/2022   COLONOSCOPY (Pts 45-30yrs Insurance coverage will need to be confirmed)  06/12/2025   HIV Screening  Completed   Pneumococcal Vaccine 48-78 Years old  Aged Out   HPV VACCINES  Aged Out       Review of Systems Pertinent items noted in HPI and remainder of comprehensive ROS otherwise negative.   Objective:  Blood pressure 136/84, pulse (!) 59, height 5\' 7"  (1.702 m), weight 267 lb 8 oz (121.3 kg).    GENERAL:  Well-developed, well-nourished female in no acute distress.  HEENT: Normocephalic, atraumatic. Sclerae anicteric.  NECK: Supple. Normal thyroid.  LUNGS: Clear to auscultation bilaterally.  HEART: Regular rate and rhythm. BREASTS: Symmetric in size. No palpable masses or lymphadenopathy, skin changes, or nipple drainage. ABDOMEN: Soft, nontender, nondistended. No organomegaly. PELVIC: Normal external female genitalia. Vagina is pale and atrophic.  Normal discharge. Vaginal cuff intact  No adnexal mass or tenderness. EXTREMITIES: No cyanosis, clubbing, or edema, 2+ distal pulses.    Assessment:    Healthy female exam.      Plan:    Screening mammogram due in November Patient had a colposcopy and Dexa scan Pap smear no longer indicated Urine culture per patient request RTC in 1 year or prn See After Visit Summary for Counseling Recommendations

## 2020-12-25 LAB — URINE CULTURE

## 2021-02-25 ENCOUNTER — Other Ambulatory Visit: Payer: Self-pay | Admitting: Internal Medicine

## 2021-02-25 DIAGNOSIS — Z1231 Encounter for screening mammogram for malignant neoplasm of breast: Secondary | ICD-10-CM

## 2021-05-22 ENCOUNTER — Other Ambulatory Visit: Payer: 59

## 2021-05-22 ENCOUNTER — Ambulatory Visit: Payer: Medicare HMO

## 2021-06-26 ENCOUNTER — Other Ambulatory Visit: Payer: Self-pay

## 2021-06-26 ENCOUNTER — Ambulatory Visit
Admission: RE | Admit: 2021-06-26 | Discharge: 2021-06-26 | Disposition: A | Discharge status: Home / Self Care | Payer: Medicare HMO | Source: Ambulatory Visit | Attending: Internal Medicine | Admitting: Internal Medicine

## 2021-06-26 DIAGNOSIS — Z1231 Encounter for screening mammogram for malignant neoplasm of breast: Secondary | ICD-10-CM

## 2021-07-26 ENCOUNTER — Ambulatory Visit (HOSPITAL_COMMUNITY)
Admission: EM | Admit: 2021-07-26 | Discharge: 2021-07-26 | Disposition: A | Discharge status: Home / Self Care | Payer: Medicare HMO | Attending: Emergency Medicine | Admitting: Emergency Medicine

## 2021-07-26 ENCOUNTER — Encounter (HOSPITAL_COMMUNITY): Payer: Self-pay | Admitting: Emergency Medicine

## 2021-07-26 DIAGNOSIS — J209 Acute bronchitis, unspecified: Secondary | ICD-10-CM | POA: Diagnosis not present

## 2021-07-26 DIAGNOSIS — J441 Chronic obstructive pulmonary disease with (acute) exacerbation: Secondary | ICD-10-CM

## 2021-07-26 DIAGNOSIS — J42 Unspecified chronic bronchitis: Secondary | ICD-10-CM

## 2021-07-26 MED ORDER — PROMETHAZINE-DM 6.25-15 MG/5ML PO SYRP: 5.0000 mL | ORAL_SOLUTION | Freq: Four times a day (QID) | ORAL | 0 refills | Status: DC | PRN

## 2021-07-26 MED ORDER — GUAIFENESIN 400 MG PO TABS: ORAL_TABLET | ORAL | 0 refills | Status: AC

## 2021-07-26 MED ORDER — MONTELUKAST SODIUM 10 MG PO TABS: 10.0000 mg | ORAL_TABLET | Freq: Every day | ORAL | 2 refills | Status: AC

## 2021-07-26 MED ORDER — CETIRIZINE HCL 10 MG PO TABS: 10.0000 mg | ORAL_TABLET | Freq: Every day | ORAL | 2 refills | Status: AC

## 2021-07-26 MED ORDER — FLUTICASONE PROPIONATE 50 MCG/ACT NA SUSP: 2.0000 | Freq: Every day | NASAL | 0 refills | Status: AC

## 2021-07-26 MED ORDER — ALBUTEROL SULFATE HFA 108 (90 BASE) MCG/ACT IN AERS: 2.0000 | INHALATION_SPRAY | Freq: Four times a day (QID) | RESPIRATORY_TRACT | 0 refills | Status: AC | PRN

## 2021-07-26 MED ORDER — TRELEGY ELLIPTA 100-62.5-25 MCG/ACT IN AEPB: 1.0000 | INHALATION_SPRAY | Freq: Every morning | RESPIRATORY_TRACT | 2 refills | Status: AC

## 2021-07-26 MED ORDER — AEROCHAMBER PLUS FLO-VU LARGE MISC: 1.0000 | Freq: Once | 0 refills | Status: AC

## 2021-07-26 MED ORDER — AMOXICILLIN-POT CLAVULANATE 875-125 MG PO TABS: 1.0000 | ORAL_TABLET | Freq: Two times a day (BID) | ORAL | 0 refills | Status: AC

## 2021-07-26 MED ORDER — METHYLPREDNISOLONE 4 MG PO TBPK: ORAL_TABLET | ORAL | 0 refills | Status: DC

## 2021-07-26 NOTE — Discharge Instructions (Addendum)
Please see the list below for recommended medications, dosages and frequencies to provide relief of your current symptoms:     Amoxicillin-clavulanate (Augmentin):  take 1 tablet twice daily for 7 days, you can take it with or without food.  This antibiotic can cause upset stomach, this will resolve once antibiotics are complete.  You are welcome to use a probiotic, eat yogurt, take Imodium while taking this medication.  Please avoid other systemic medications such as Maalox, Pepto-Bismol or milk of magnesia as they can interfere with your body's ability to absorb the antibiotics.  Methylprednisolone (Medrol Dosepak): This is a steroid that will significantly calm your upper and lower airways, please take one row of tablets daily with your breakfast meal starting tomorrow morning until the prescription is complete.      Albuterol HFA: This is a bronchodilator, it relaxes the smooth muscles that constrict your airway in your lungs when you are feeling sick or having inflammation secondary to allergies or upper respiratory infection.  Please inhale 2 puffs twice daily every day using the spacer provided.  You can also inhale 2 more puffs as often as needed throughout the day for aggravating cough, chest tightness, feeling short of breath, wheezing.      Fluticasone (Flonase): This is a steroid nasal spray that you use once daily, 1 spray in each nare.  After 3 to 5 days of use, you will have significant improvement of the inflammation and mucus production that is being caused by exposure to allergens.  This medication can be purchased over-the-counter however I have provided you with a prescription.      Cetirizine (Zyrtec): This is an excellent second-generation antihistamine that helps to reduce respiratory inflammatory response to viruses and environmental allergens.  Please take 1 tablet daily at bedtime.   Guaifenesin (Robitussin, Mucinex): This is an expectorant.  This helps break up chest congestion  and loosen up thick nasal drainage making phlegm and drainage more liquid and therefore easier to remove.  I recommend being 400 mg three times daily as needed.      Promethazine DM: Promethazine is both a nasal decongestant and an antinausea medication that makes most patients feel fairly sleepy.  The DM is dextromethorphan, a cough suppressant found in many over-the-counter cough medications.  Please take 5 mL before bedtime to minimize your cough which will help you sleep better.  I have provided you with a prescription for this medication.     I also recommend that you begin 2 new medications, montelukast (Singulair), and fluticasone-umeclidinium-vilanterol (Trelegy).  Montelukast (Singulair): Singulair is a mast cell stabilizer that helps prevent inflammation in the lungs when your lungs are challenged with the virus, environmental allergens or any other sort of irritant.  Singulair is taken daily with Zyrtec at bedtime.  Studies have shown that taking Singulair daily can reduce her incidence of exacerbations of COPD.  Fluticasone-umeclidinium-vilanterol (Trelegy): This is the maintenance inhaler that we discussed during your visit today.  It contains a long-acting albuterol (vilanterol) as well as a long-acting inhaled corticosteroid (fluticasone).  Fluticasone is the same steroid that you currently using the nasal spray prescribed for you by your primary care provider.  Umeclidinium is an interesting third ingredient that is similar to albuterol and that it provides relaxation of tightness in your airway and improves your work of breathing.  The recommended usage is 1 inhalation once daily every morning.  This medication was listed as tier 3 with your insurance plan.  Please discuss continuing this  medication with your primary care provider when you follow-up.  Conservative care is also recommended at this time.  This includes rest, pushing clear fluids and activity as tolerated.  Warm beverages  such as teas and broths versus cold beverages/popsicles and frozen sherbet/sorbet are your choice, both warm and cold are beneficial.  You may also notice that your appetite is reduced; this is okay as long as you are drinking plenty of clear fluids.    Please follow-up within the next 3 to 5 days either with your primary care provider or urgent care if your symptoms do not resolve.  If you do not have a primary care provider, we will assist you in finding one.

## 2021-07-26 NOTE — ED Provider Notes (Signed)
[place]    CSN: [id number] Arrival date & time: 07/26/21  1103    HISTORY   Chief Complaint  Patient presents with   Cough   HPI Emily Holland is a 61 y.o. female. Patient complains of a productive cough for the past week.  Patient states the cough is worse at night and causes pain in her chest.  EMR reviewed, patient has a history of bronchitis, last seen by her provider for this July 08, 2020.  Patient receives regular refills of albuterol.  The history is provided by the patient.  Past Medical History:  Diagnosis Date   Arthritis    "bones; joints; legs; ankles" (03/17/2016)   GERD (gastroesophageal reflux disease)    Gout    History of blood transfusion 06/2015; 08/2015; 03/17/2016   History of iron deficiency anemia    took iron supplements 06/2015-3/ 2017; stopped/dr's order (03/17/2016)   Iron deficiency anemia due to chronic blood loss 07/04/2017   Low hemoglobin 06/2015; 08/2015; 03/17/2016   Shortness of breath on exertion 06/03/2011   Symptomatic anemia 06/2015; 08/2015; 03/17/2016   Patient Active Problem List   Diagnosis Date Noted   Iron deficiency anemia due to chronic blood loss 04/22/2016   GERD (gastroesophageal reflux disease) 03/18/2016   Anemia 03/17/2016   Symptomatic anemia 03/17/2016   Gout 08/17/2015   Anemia, iron deficiency 08/17/2015   GI bleeding 06/15/2015   Acute blood loss anemia 06/11/2015   Hypoxemia 06/03/2011   Fever 06/03/2011   Obesity 06/03/2011   Past Surgical History:  Procedure Laterality Date   CERVICAL CONIZATION W/BX  08/2004   COLONOSCOPY N/A 06/13/2015   Procedure: COLONOSCOPY;  Surgeon: Carol Ada, MD;  Location: [facility];  Service: Endoscopy;  Laterality: N/A;   ESOPHAGOGASTRODUODENOSCOPY N/A 06/13/2015   Procedure: ESOPHAGOGASTRODUODENOSCOPY (EGD)/Colonoscopy;  Surgeon: Carol Ada, MD;  Location: [facility];  Service: Endoscopy;  Laterality: N/A;   GIVENS CAPSULE STUDY N/A 06/14/2015    Procedure: GIVENS CAPSULE STUDY;  Surgeon: Carol Ada, MD;  Location: [facility];  Service: Endoscopy;  Laterality: N/A;   GIVENS CAPSULE STUDY N/A 03/19/2016   Procedure: GIVENS CAPSULE STUDY;  Surgeon: Carol Ada, MD;  Location: [place];  Service: Endoscopy;  Laterality: N/A;   GIVENS CAPSULE STUDY N/A 07/05/2017   Procedure: GIVENS CAPSULE STUDY;  Surgeon: Carol Ada, MD;  Location: [place];  Service: Endoscopy;  Laterality: N/A;   GIVENS CAPSULE STUDY N/A 02/15/2018   Procedure: GIVENS CAPSULE STUDY;  Surgeon: Carol Ada, MD;  Location: [place];  Service: Endoscopy;  Laterality: N/A;   VAGINAL HYSTERECTOMY  ~ 2005   OB History     Gravida  6   Para  0   Term  0   Preterm  0   AB  0   Living  6      SAB  0   IAB  0   Ectopic  0   Multiple      Live Births  6          Home Medications    Prior to Admission medications   Medication Sig Start Date End Date Taking? Authorizing Provider  acetaminophen (TYLENOL) 325 MG tablet Take 2 tablets (650 mg total) by mouth every 6 (six) hours as needed. 12/14/19   Darr, Edison Nasuti, PA-C  albuterol (VENTOLIN HFA) 108 (90 Base) MCG/ACT inhaler Inhale 1-2 puffs into the lungs every 6 (six) hours as needed for wheezing or shortness of breath. 12/14/19   Darr, Edison Nasuti, PA-C  albuterol (VENTOLIN HFA) 108 (90 Base) MCG/ACT inhaler INHALE 2 PUFFS INTO THE LUNGS FOUR TIMES DAILY AS NEEDED. 07/08/20 07/08/21  Nolene Ebbs, MD  allopurinol (ZYLOPRIM) 300 MG tablet TAKE 1 TABLET BY MOUTH EVERY DAY 06/17/20 06/17/21  Nolene Ebbs, MD  cetirizine (ZYRTEC) 10 MG tablet TAKE 1 TABLET BY MOUT DAILY AS NEEDED 06/17/20 06/17/21  Nolene Ebbs, MD  cholecalciferol (VITAMIN D) 1000 units tablet Take 1,000 Units by mouth daily.    [provider]  colchicine 0.6 MG tablet TAKE 1 TABLET BY MOUTH EVERY DAY AS NEEDED FOR GOUT FLARE UP 06/17/20 06/17/21  Nolene Ebbs, MD  ferrous sulfate 325 (65 FE) MG tablet Take 1 tablet (325 mg  total) by mouth 2 (two) times daily with a meal. Patient taking differently: Take 325 mg by mouth at bedtime. 03/20/16   Patrecia Pour, MD  fluticasone Asencion Islam) 50 MCG/ACT nasal spray USE 2 SPRAYS IN [facility] NOSTRIL IN THE EVENING 06/17/20 06/17/21  Nolene Ebbs, MD  Menthol (CEPACOL SORE THROAT) 5.4 MG LOZG Use as directed 1 lozenge (5.4 mg total) in the mouth or throat every 2 (two) hours as needed. 12/14/19   Darr, Edison Nasuti, PA-C  pantoprazole (PROTONIX) 40 MG tablet TAKE 1 TABLET BY MOUTH EVERY DAY 06/17/20 06/17/21  Nolene Ebbs, MD  polyethylene glycol (MIRALAX / GLYCOLAX) packet Take 17 g by mouth daily as needed for mild constipation or moderate constipation. 07/06/17   Alma Friendly, MD  triamcinolone cream (KENALOG) 0.5 %  01/23/19   [provider]  Vitamin D, Ergocalciferol, (DRISDOL) 1.25 MG (50000 UNIT) CAPS capsule Take 50,000 Units by mouth once a week. 12/15/20   [provider]   Family History Family History  Problem Relation Age of Onset   COPD Other    Healthy Mother    Allergies Father    Hernia Father    Social History Social History   Tobacco Use   Smoking status: Never   Smokeless tobacco: Never  Vaping Use   Vaping Use: Never used  Substance Use Topics   Alcohol use: Yes    Comment: Occassional   Drug use: No   Allergies   Feraheme [ferumoxytol]  Review of Systems Review of Systems Pertinent findings noted in history of present illness.   Physical Exam Triage Vital Signs ED Triage Vitals  Enc Vitals Group     BP 04/04/21 0827 (!) 147/82     Pulse Rate 04/04/21 0827 72     Resp 04/04/21 0827 18     Temp 04/04/21 0827 98.3 F (36.8 C)     Temp Source 04/04/21 0827 Oral     SpO2 04/04/21 0827 98 %     Weight --      Height --      Head Circumference --      Peak Flow --      Pain Score 04/04/21 0826 5     Pain Loc --      Pain Edu? --      Excl. in [place]? --   No data found.  Updated Vital Signs BP (!) 145/83 (BP Location:  Right Arm)    Pulse 73    Temp 98.2 F (36.8 C) (Oral)    Resp 19    SpO2 92%   Physical Exam Vitals and nursing note reviewed.  Constitutional:      General: She is awake. She is not in acute distress.    Appearance: Normal appearance. She is well-developed and well-groomed.  She is obese. She is ill-appearing. She is not toxic-appearing or diaphoretic.  HENT:     Head: Normocephalic and atraumatic.     Salivary Glands: Right salivary gland is not diffusely enlarged or tender. Left salivary gland is not diffusely enlarged or tender.     Right Ear: Hearing, tympanic membrane, ear canal and external ear normal. No drainage. No middle ear effusion. There is no impacted cerumen. Tympanic membrane is not erythematous or bulging.     Left Ear: Hearing, tympanic membrane, ear canal and external ear normal. No drainage.  No middle ear effusion. There is no impacted cerumen. Tympanic membrane is not erythematous or bulging.     Nose: Mucosal edema present. No nasal deformity, septal deviation, congestion or rhinorrhea.     Right Turbinates: Enlarged and swollen. Not pale.     Left Turbinates: Enlarged and swollen. Not pale.     Right Sinus: No maxillary sinus tenderness or frontal sinus tenderness.     Left Sinus: No maxillary sinus tenderness or frontal sinus tenderness.     Mouth/Throat:     Lips: Pink. No lesions.     Mouth: Mucous membranes are moist. No oral lesions.     Pharynx: Oropharynx is clear. Uvula midline. No pharyngeal swelling, oropharyngeal exudate, posterior oropharyngeal erythema or uvula swelling.     Tonsils: No tonsillar exudate or tonsillar abscesses. 0 on the right. 0 on the left.     Comments: Postnasal drip Eyes:     General: Lids are normal.        Right eye: No discharge.        Left eye: No discharge.     Extraocular Movements: Extraocular movements intact.     Conjunctiva/sclera: Conjunctivae normal.     Right eye: Right conjunctiva is not injected.     Left eye:  Left conjunctiva is not injected.     Pupils: Pupils are equal, round, and reactive to light.  Neck:     Trachea: Trachea and phonation normal.  Cardiovascular:     Rate and Rhythm: Normal rate and regular rhythm.     Pulses: Normal pulses.     Heart sounds: Normal heart sounds, S1 normal and S2 normal. Heart sounds not distant. No murmur heard.   No friction rub. No gallop. No S3 or S4 sounds.  Pulmonary:     Effort: Pulmonary effort is normal. No accessory muscle usage, prolonged expiration or respiratory distress.     Breath sounds: No stridor, decreased air movement or transmitted upper airway sounds. Examination of the right-middle field reveals wheezing. Examination of the left-middle field reveals wheezing and rhonchi. Examination of the right-lower field reveals rhonchi and rales. Wheezing, rhonchi and rales present. No decreased breath sounds.     Comments: Significant bronchospasm with cough Chest:     Chest wall: No tenderness.  Musculoskeletal:        General: Normal range of motion.     Cervical back: Normal range of motion and neck supple. Normal range of motion.     Right lower leg: 2+ Edema present.     Left lower leg: 2+ Edema present.  Lymphadenopathy:     Cervical: No cervical adenopathy.     Right cervical: No superficial, deep or posterior cervical adenopathy.    Left cervical: No superficial or deep cervical adenopathy.  Skin:    General: Skin is warm and dry.     Findings: No erythema or rash.  Neurological:     General: No  focal deficit present.     Mental Status: She is alert and oriented to person, place, and time.  Psychiatric:        Mood and Affect: Mood normal.        Behavior: Behavior normal. Behavior is cooperative.    Visual Acuity Right Eye Distance:   Left Eye Distance:   Bilateral Distance:    Right Eye Near:   Left Eye Near:    Bilateral Near:     UC Couse / Diagnostics / Procedures:    EKG  Radiology No results  found.  Procedures Procedures (including critical care time)  UC Diagnoses / Final Clinical Impressions(s)   I have reviewed the triage vital signs and the nursing notes.  Pertinent labs & imaging results that were available during my care of the patient were reviewed by me and considered in my medical decision making (see chart for details).   Final diagnoses:  Bronchitis, chronic obstructive, with exacerbation (HCC)  Chronic bronchitis, unspecified chronic bronchitis type (HCC)  Bronchospasm with bronchitis, acute   Acute exacerbation of chronic bronchitis.  Will provide patient with antibiotic for presumed bacterial infection given presence of rales and rhonchi, patient provided with steroids to resolve wheezing and bronchospasm.  Albuterol renewed.  Discussed with patient benefit of beginning maintenance inhaler and Singulair.  Prescriptions provided.  Return precautions advised.  Patient advised to follow-up with primary care in the next 3 to 4 days.  ED Prescriptions     Medication Sig Dispense Auth. Provider   albuterol (VENTOLIN HFA) 108 (90 Base) MCG/ACT inhaler Inhale 2 puffs into the lungs every 6 (six) hours as needed for wheezing or shortness of breath (Cough). 18 g Lynden Oxford Scales, PA-C   amoxicillin-clavulanate (AUGMENTIN) 875-125 MG tablet Take 1 tablet by mouth 2 (two) times daily for 7 days. 14 tablet Lynden Oxford Scales, PA-C   fluticasone (FLONASE) 50 MCG/ACT nasal spray Place 2 sprays into both nostrils daily. 18 mL Lynden Oxford Scales, PA-C   Spacer/Aero-Holding Chambers (AEROCHAMBER PLUS FLO-VU LARGE) MISC 1 each by Other route once for 1 dose. 1 each Lynden Oxford Scales, PA-C   guaifenesin (HUMIBID E) 400 MG TABS tablet Take 1 tablet 3 times daily as needed for chest congestion and cough 21 tablet Lynden Oxford Scales, PA-C   promethazine-dextromethorphan (PROMETHAZINE-DM) 6.25-15 MG/5ML syrup Take 5 mLs by mouth 4 (four) times daily as needed for  cough. 180 mL Lynden Oxford Scales, PA-C   cetirizine (ZYRTEC ALLERGY) 10 MG tablet Take 1 tablet (10 mg total) by mouth at bedtime. 30 tablet Lynden Oxford Scales, PA-C   montelukast (SINGULAIR) 10 MG tablet Take 1 tablet (10 mg total) by mouth at bedtime. 30 tablet Lynden Oxford Scales, PA-C   Fluticasone-Umeclidin-Vilant (TRELEGY ELLIPTA) 100-62.5-25 MCG/ACT AEPB Inhale 1 puff into the lungs in the morning. 1 each Lynden Oxford Scales, PA-C   methylPREDNISolone (MEDROL DOSEPAK) 4 MG TBPK tablet Take 24 mg on day 1, 20 mg on day 2, 16 mg on day 3, 12 mg on day 4, 8 mg on day 5, 4 mg on day 6. 21 tablet Lynden Oxford Scales, PA-C      PDMP not reviewed this encounter.  Pending results:  Labs Reviewed - No data to display  Medications Ordered in UC: Medications - No data to display  Disposition Upon Discharge:  Condition: stable for discharge home Home: take medications as prescribed; routine discharge instructions as discussed; follow up as advised.  Patient presented with an acute illness with  associated systemic symptoms and significant discomfort requiring urgent management. In my opinion, this is a condition that a prudent lay person (someone who possesses an average knowledge of health and medicine) may potentially expect to result in complications if not addressed urgently such as respiratory distress, impairment of bodily function or dysfunction of bodily organs.   Routine symptom specific, illness specific and/or disease specific instructions were discussed with the patient and/or caregiver at length.   As such, the patient has been evaluated and assessed, work-up was performed and treatment was provided in alignment with urgent care protocols and evidence based medicine.  Patient/parent/caregiver has been advised that the patient may require follow up for further testing and treatment if the symptoms continue in spite of treatment, as clinically indicated and  appropriate.  If the patient was tested for COVID-19, Influenza and/or RSV, then the patient/parent/guardian was advised to isolate at home pending the results of his/her diagnostic coronavirus test and potentially longer if theyre positive. I have also advised pt that if his/her COVID-19 test returns positive, it's recommended to self-isolate for at least 10 days after symptoms first appeared AND until fever-free for 24 hours without fever reducer AND other symptoms have improved or resolved. Discussed self-isolation recommendations as well as instructions for household member/close contacts as per the [facility] and [place] DHHS, and also gave patient the [place] packet with this information.  Patient/parent/caregiver has been advised to return to the [facility] or PCP in 3-5 days if no better; to PCP or the Emergency Department if new signs and symptoms develop, or if the current signs or symptoms continue to change or worsen for further workup, evaluation and treatment as clinically indicated and appropriate  The patient will follow up with their current PCP if and as advised. If the patient does not currently have a PCP we will assist them in obtaining one.   The patient may need specialty follow up if the symptoms continue, in spite of conservative treatment and management, for further workup, evaluation, consultation and treatment as clinically indicated and appropriate.  Patient/parent/caregiver verbalized understanding and agreement of plan as discussed.  All questions were addressed during visit.  Please see discharge instructions below for further details of plan.  Discharge Instructions:   Discharge Instructions      Please see the list below for recommended medications, dosages and frequencies to provide relief of your current symptoms:     Amoxicillin-clavulanate (Augmentin):  take 1 tablet twice daily for 7 days, you can take it with or without food.  This antibiotic can cause upset stomach, this will  resolve once antibiotics are complete.  You are welcome to use a probiotic, eat yogurt, take Imodium while taking this medication.  Please avoid other systemic medications such as Maalox, Pepto-Bismol or milk of magnesia as they can interfere with your body's ability to absorb the antibiotics.  Methylprednisolone (Medrol Dosepak): This is a steroid that will significantly calm your upper and lower airways, please take one row of tablets daily with your breakfast meal starting tomorrow morning until the prescription is complete.      Albuterol HFA: This is a bronchodilator, it relaxes the smooth muscles that constrict your airway in your lungs when you are feeling sick or having inflammation secondary to allergies or upper respiratory infection.  Please inhale 2 puffs twice daily every day using the spacer provided.  You can also inhale 2 more puffs as often as needed throughout the day for aggravating cough, chest tightness, feeling short of  breath, wheezing.      Fluticasone (Flonase): This is a steroid nasal spray that you use once daily, 1 spray in each nare.  After 3 to 5 days of use, you will have significant improvement of the inflammation and mucus production that is being caused by exposure to allergens.  This medication can be purchased over-the-counter however I have provided you with a prescription.      Cetirizine (Zyrtec): This is an excellent second-generation antihistamine that helps to reduce respiratory inflammatory response to viruses and environmental allergens.  Please take 1 tablet daily at bedtime.   Guaifenesin (Robitussin, Mucinex): This is an expectorant.  This helps break up chest congestion and loosen up thick nasal drainage making phlegm and drainage more liquid and therefore easier to remove.  I recommend being 400 mg three times daily as needed.      Promethazine DM: Promethazine is both a nasal decongestant and an antinausea medication that makes most patients feel fairly  sleepy.  The DM is dextromethorphan, a cough suppressant found in many over-the-counter cough medications.  Please take 5 mL before bedtime to minimize your cough which will help you sleep better.  I have provided you with a prescription for this medication.     I also recommend that you begin 2 new medications, montelukast (Singulair), and fluticasone-umeclidinium-vilanterol (Trelegy).  Montelukast (Singulair): Singulair is a mast cell stabilizer that helps prevent inflammation in the lungs when your lungs are challenged with the virus, environmental allergens or any other sort of irritant.  Singulair is taken daily with Zyrtec at bedtime.  Studies have shown that taking Singulair daily can reduce her incidence of exacerbations of COPD.  Fluticasone-umeclidinium-vilanterol (Trelegy): This is the maintenance inhaler that we discussed during your visit today.  It contains a long-acting albuterol (vilanterol) as well as a long-acting inhaled corticosteroid (fluticasone).  Fluticasone is the same steroid that you currently using the nasal spray prescribed for you by your primary care provider.  Umeclidinium is an interesting third ingredient that is similar to albuterol and that it provides relaxation of tightness in your airway and improves your work of breathing.  The recommended usage is 1 inhalation once daily every morning.  This medication was listed as tier 3 with your insurance plan.  Please discuss continuing this medication with your primary care provider when you follow-up.  Conservative care is also recommended at this time.  This includes rest, pushing clear fluids and activity as tolerated.  Warm beverages such as teas and broths versus cold beverages/popsicles and frozen sherbet/sorbet are your choice, both warm and cold are beneficial.  You may also notice that your appetite is reduced; this is okay as long as you are drinking plenty of clear fluids.    Please follow-up within the next 3 to 5  days either with your primary care provider or urgent care if your symptoms do not resolve.  If you do not have a primary care provider, we will assist you in finding one.     This office note has been dictated using Museum/gallery curator.  Unfortunately, and despite my best efforts, this method of dictation can sometimes lead to occasional typographical or grammatical errors.  I apologize in advance if this occurs.     Lynden Oxford Scales, PA-C 07/26/21 1414

## 2021-07-26 NOTE — ED Triage Notes (Signed)
Pt reports had cough that is productive for a week. Reports cough is worse at night. Reports when coughing chest will hurt.

## 2021-10-08 ENCOUNTER — Ambulatory Visit (HOSPITAL_COMMUNITY)
Admission: EM | Admit: 2021-10-08 | Discharge: 2021-10-08 | Disposition: A | Discharge status: Home / Self Care | Payer: Medicare HMO | Attending: Family Medicine | Admitting: Family Medicine

## 2021-10-08 ENCOUNTER — Encounter (HOSPITAL_COMMUNITY): Payer: Self-pay | Admitting: Emergency Medicine

## 2021-10-08 DIAGNOSIS — Z780 Asymptomatic menopausal state: Secondary | ICD-10-CM

## 2021-10-08 DIAGNOSIS — Z90710 Acquired absence of both cervix and uterus: Secondary | ICD-10-CM

## 2021-10-08 DIAGNOSIS — N939 Abnormal uterine and vaginal bleeding, unspecified: Secondary | ICD-10-CM | POA: Diagnosis not present

## 2021-10-08 LAB — POCT URINALYSIS DIPSTICK, ED / UC
Bilirubin Urine: NEGATIVE
Glucose, UA: NEGATIVE mg/dL
Ketones, ur: NEGATIVE mg/dL
Leukocytes,Ua: NEGATIVE
Nitrite: NEGATIVE
Protein, ur: NEGATIVE mg/dL
Specific Gravity, Urine: 1.01 (ref 1.005–1.030)
Urobilinogen, UA: 0.2 mg/dL (ref 0.0–1.0)
pH: 6.5 (ref 5.0–8.0)

## 2021-10-08 NOTE — ED Provider Notes (Signed)
?[facility] ? ? ? ?CSN: [id number] ?Arrival date & time: 10/08/21  1232 ? ? ?  ? ?History   ?Chief Complaint ?Chief Complaint  ?Patient presents with  ? Vaginal Bleeding  ? ? ?HPI ?Emily Holland is a 61 y.o. female.  ? ?Patient is here for vaginal bleeding since yesterday.   She noted it in the toilet in the water and when wiping.  Was more yesterday than today.  She only noted slight spotting in her underwear.   ?She has had a hysterectomy.  ?She thinks this is coming from her vagina rather then urine.   ?No blood with a bm.  ?She has had some urinary frequency and dysuria as well.  ? ?Past Medical History:  ?Diagnosis Date  ? Arthritis   ? "bones; joints; legs; ankles" (03/17/2016)  ? GERD (gastroesophageal reflux disease)   ? Gout   ? History of blood transfusion 06/2015; 08/2015; 03/17/2016  ? History of iron deficiency anemia   ? took iron supplements 06/2015-3/ 2017; stopped/dr's order (03/17/2016)  ? Iron deficiency anemia due to chronic blood loss 07/04/2017  ? Low hemoglobin 06/2015; 08/2015; 03/17/2016  ? Shortness of breath on exertion 06/03/2011  ? Symptomatic anemia 06/2015; 08/2015; 03/17/2016  ? ? ?Patient Active Problem List  ? Diagnosis Date Noted  ? Iron deficiency anemia due to chronic blood loss 04/22/2016  ? GERD (gastroesophageal reflux disease) 03/18/2016  ? Anemia 03/17/2016  ? Symptomatic anemia 03/17/2016  ? Gout 08/17/2015  ? Anemia, iron deficiency 08/17/2015  ? GI bleeding 06/15/2015  ? Acute blood loss anemia 06/11/2015  ? Hypoxemia 06/03/2011  ? Fever 06/03/2011  ? Obesity 06/03/2011  ? ? ?Past Surgical History:  ?Procedure Laterality Date  ? CERVICAL CONIZATION W/BX  08/2004  ? COLONOSCOPY N/A 06/13/2015  ? Procedure: COLONOSCOPY;  Surgeon: Jeani Hawking, MD;  Location: [facility];  Service: Endoscopy;  Laterality: N/A;  ? ESOPHAGOGASTRODUODENOSCOPY N/A 06/13/2015  ? Procedure: ESOPHAGOGASTRODUODENOSCOPY (EGD)/Colonoscopy;  Surgeon: Jeani Hawking, MD;  Location: [facility];   Service: Endoscopy;  Laterality: N/A;  ? GIVENS CAPSULE STUDY N/A 06/14/2015  ? Procedure: GIVENS CAPSULE STUDY;  Surgeon: Jeani Hawking, MD;  Location: [facility];  Service: Endoscopy;  Laterality: N/A;  ? GIVENS CAPSULE STUDY N/A 03/19/2016  ? Procedure: GIVENS CAPSULE STUDY;  Surgeon: Jeani Hawking, MD;  Location: [facility] ENDOSCOPY;  Service: Endoscopy;  Laterality: N/A;  ? GIVENS CAPSULE STUDY N/A 07/05/2017  ? Procedure: GIVENS CAPSULE STUDY;  Surgeon: Jeani Hawking, MD;  Location: [facility] ENDOSCOPY;  Service: Endoscopy;  Laterality: N/A;  ? GIVENS CAPSULE STUDY N/A 02/15/2018  ? Procedure: GIVENS CAPSULE STUDY;  Surgeon: Jeani Hawking, MD;  Location: [facility] ENDOSCOPY;  Service: Endoscopy;  Laterality: N/A;  ? VAGINAL HYSTERECTOMY  ~ 2005  ? ? ?OB History   ? ? Gravida  ?6  ? Para  ?0  ? Term  ?0  ? Preterm  ?0  ? AB  ?0  ? Living  ?6  ?  ? ? SAB  ?0  ? IAB  ?0  ? Ectopic  ?0  ? Multiple  ?   ? Live Births  ?6  ?   ?  ?  ? ? ? ?Home Medications   ? ?Prior to Admission medications   ?Medication Sig Start Date End Date Taking? Authorizing Provider  ?acetaminophen (TYLENOL) 325 MG tablet Take 2 tablets (650 mg total) by mouth every 6 (six) hours as needed. 12/14/19   Darr, Gerilyn Pilgrim, PA-C  ?albuterol (VENTOLIN HFA)  108 (90 Base) MCG/ACT inhaler Inhale 2 puffs into the lungs every 6 (six) hours as needed for wheezing or shortness of breath (Cough). 07/26/21   Theadora RamaMorgan, Lindsay Scales, PA-C  ?allopurinol (ZYLOPRIM) 300 MG tablet TAKE 1 TABLET BY MOUTH EVERY DAY 06/17/20 06/17/21  Fleet ContrasAvbuere, Edwin, MD  ?cetirizine (ZYRTEC ALLERGY) 10 MG tablet Take 1 tablet (10 mg total) by mouth at bedtime. 07/26/21 10/24/21  Theadora RamaMorgan, Lindsay Scales, PA-C  ?cholecalciferol (VITAMIN D) 1000 units tablet Take 1,000 Units by mouth daily.    [provider]  ?colchicine 0.6 MG tablet TAKE 1 TABLET BY MOUTH EVERY DAY AS NEEDED FOR GOUT FLARE UP 06/17/20 06/17/21  Fleet ContrasAvbuere, Edwin, MD  ?ferrous sulfate 325 (65 FE) MG tablet Take 1 tablet (325 mg total) by mouth 2  (two) times daily with a meal. ?Patient taking differently: Take 325 mg by mouth at bedtime. 03/20/16   Tyrone NineGrunz, Ryan B, MD  ?fluticasone (FLONASE) 50 MCG/ACT nasal spray Place 2 sprays into both nostrils daily. 07/26/21   Theadora RamaMorgan, Lindsay Scales, PA-C  ?Fluticasone-Umeclidin-Vilant (TRELEGY ELLIPTA) 100-62.5-25 MCG/ACT AEPB Inhale 1 puff into the lungs in the morning. 07/26/21   Theadora RamaMorgan, Lindsay Scales, PA-C  ?guaifenesin (HUMIBID E) 400 MG TABS tablet Take 1 tablet 3 times daily as needed for chest congestion and cough 07/26/21   Theadora RamaMorgan, Lindsay Scales, PA-C  ?Menthol (CEPACOL SORE THROAT) 5.4 MG LOZG Use as directed 1 lozenge (5.4 mg total) in the mouth or throat every 2 (two) hours as needed. 12/14/19   Darr, Gerilyn PilgrimJacob, PA-C  ?methylPREDNISolone (MEDROL DOSEPAK) 4 MG TBPK tablet Take 24 mg on day 1, 20 mg on day 2, 16 mg on day 3, 12 mg on day 4, 8 mg on day 5, 4 mg on day 6. 07/26/21   Theadora RamaMorgan, Lindsay Scales, PA-C  ?montelukast (SINGULAIR) 10 MG tablet Take 1 tablet (10 mg total) by mouth at bedtime. 07/26/21 10/24/21  Theadora RamaMorgan, Lindsay Scales, PA-C  ?pantoprazole (PROTONIX) 40 MG tablet TAKE 1 TABLET BY MOUTH EVERY DAY 06/17/20 06/17/21  Fleet ContrasAvbuere, Edwin, MD  ?polyethylene glycol (MIRALAX / GLYCOLAX) packet Take 17 g by mouth daily as needed for mild constipation or moderate constipation. 07/06/17   Briant CedarEzenduka, Nkeiruka J, MD  ?promethazine-dextromethorphan (PROMETHAZINE-DM) 6.25-15 MG/5ML syrup Take 5 mLs by mouth 4 (four) times daily as needed for cough. 07/26/21   Theadora RamaMorgan, Lindsay Scales, PA-C  ?triamcinolone cream (KENALOG) 0.5 %  01/23/19   [provider]  ?Vitamin D, Ergocalciferol, (DRISDOL) 1.25 MG (50000 UNIT) CAPS capsule Take 50,000 Units by mouth once a week. 12/15/20   [provider]  ? ? ?Family History ?Family History  ?Problem Relation Age of Onset  ? COPD Other   ? Healthy Mother   ? Allergies Father   ? Hernia Father   ? ? ?Social History ?Social History  ? ?Tobacco Use  ? Smoking status: Never  ?  Smokeless tobacco: Never  ?Vaping Use  ? Vaping Use: Never used  ?Substance Use Topics  ? Alcohol use: Yes  ?  Comment: Occassional  ? Drug use: No  ? ? ? ?Allergies   ?Feraheme [ferumoxytol] ? ? ?Review of Systems ?Review of Systems  ?Constitutional: Negative.   ?HENT: Negative.    ?Respiratory: Negative.    ?Cardiovascular: Negative.   ?Genitourinary:  Positive for vaginal bleeding.  ? ? ?Physical Exam ?Triage Vital Signs ?ED Triage Vitals  ?Enc Vitals Group  ?   BP 10/08/21 1305 139/71  ?   Pulse Rate 10/08/21 1305 76  ?  Resp 10/08/21 1305 17  ?   Temp 10/08/21 1305 98.3 ?F (36.8 ?C)  ?   Temp Source 10/08/21 1305 Oral  ?   SpO2 --   ?   Weight --   ?   Height --   ?   Head Circumference --   ?   Peak Flow --   ?   Pain Score 10/08/21 1306 0  ?   Pain Loc --   ?   Pain Edu? --   ?   Excl. in GC? --   ? ?No data found. ? ?Updated Vital Signs ?BP 139/71   Pulse 76   Temp 98.3 ?F (36.8 ?C) (Oral)   Resp 17  ? ?Visual Acuity ?Right Eye Distance:   ?Left Eye Distance:   ?Bilateral Distance:   ? ?Right Eye Near:   ?Left Eye Near:    ?Bilateral Near:    ? ?Physical Exam ?Constitutional:   ?   Appearance: Normal appearance.  ?Cardiovascular:  ?   Rate and Rhythm: Normal rate.  ?Pulmonary:  ?   Effort: Pulmonary effort is normal.  ?Abdominal:  ?   Palpations: Abdomen is soft.  ?   Tenderness: There is no abdominal tenderness. There is no guarding.  ?Genitourinary: ?   Comments: No external bleeding or hemorrhoid noted;  ?On speculum exam there is watery pooled blood noted;  no sores/lesions noted within the vaginal wall ?Neurological:  ?   Mental Status: She is alert.  ? ? ? ?UC Treatments / Results  ?Labs ?(all labs ordered are listed, but only abnormal results are displayed) ?Labs Reviewed  ?POCT URINALYSIS DIPSTICK, ED / UC - Abnormal; Notable for the following components:  ?    Result Value  ? Hgb urine dipstick TRACE (*)   ? All other components within normal limits  ? ? ?EKG ? ? ?Radiology ?No results  found. ? ?Procedures ?Procedures (including critical care time) ? ?Medications Ordered in UC ?Medications - No data to display ? ?Initial Impression / Assessment and Plan / UC Course  ?I have reviewed the triage vital sig

## 2021-10-08 NOTE — Discharge Instructions (Signed)
You were seen today for vaginal bleeding.  ?Your exam does show slight blood within the vagina, but I am not sure what the source of this is.  ?I recommend you follow up with an ob/gyn for further evaluation.  Please call [phone], or [phone].  ?If you start to have very heavy bleeding, then please go to the ER for evaluation.  ?

## 2021-10-08 NOTE — ED Triage Notes (Signed)
Pt is present today with concerns of abnormal vaginal bleeding. Pt states her bleeding started yesterday. Pt states that she notices spotting when she uses the bathroom after wiping  ?

## 2021-10-11 ENCOUNTER — Encounter (HOSPITAL_COMMUNITY): Payer: Self-pay

## 2021-10-11 ENCOUNTER — Emergency Department (HOSPITAL_COMMUNITY)
Admission: EM | Admit: 2021-10-11 | Discharge: 2021-10-11 | Disposition: A | Discharge status: Home / Self Care | Payer: Medicare HMO | Attending: Emergency Medicine | Admitting: Emergency Medicine

## 2021-10-11 DIAGNOSIS — N939 Abnormal uterine and vaginal bleeding, unspecified: Secondary | ICD-10-CM

## 2021-10-11 DIAGNOSIS — D72819 Decreased white blood cell count, unspecified: Secondary | ICD-10-CM | POA: Insufficient documentation

## 2021-10-11 DIAGNOSIS — R300 Dysuria: Secondary | ICD-10-CM | POA: Insufficient documentation

## 2021-10-11 LAB — CBC
HCT: 42.1 % (ref 36.0–46.0)
Hemoglobin: 13 g/dL (ref 12.0–15.0)
MCH: 29.2 pg (ref 26.0–34.0)
MCHC: 30.9 g/dL (ref 30.0–36.0)
MCV: 94.6 fL (ref 80.0–100.0)
Platelets: 215 10*3/uL (ref 150–400)
RBC: 4.45 MIL/uL (ref 3.87–5.11)
RDW: 13.5 % (ref 11.5–15.5)
WBC: 3.2 10*3/uL — ABNORMAL LOW (ref 4.0–10.5)
nRBC: 0 % (ref 0.0–0.2)

## 2021-10-11 LAB — URINALYSIS, ROUTINE W REFLEX MICROSCOPIC
Bilirubin Urine: NEGATIVE
Glucose, UA: NEGATIVE mg/dL
Ketones, ur: NEGATIVE mg/dL
Leukocytes,Ua: NEGATIVE
Nitrite: NEGATIVE
Protein, ur: NEGATIVE mg/dL
RBC / HPF: >50 RBC/hpf — ABNORMAL HIGH (ref 0–5)
Specific Gravity, Urine: 1.013 (ref 1.005–1.030)
pH: 5 (ref 5.0–8.0)

## 2021-10-11 LAB — TYPE AND SCREEN
ABO/RH(D): B POS
Antibody Screen: NEGATIVE

## 2021-10-11 NOTE — Discharge Instructions (Addendum)
Been evaluated for vaginal bleeding.  Your work-up not show anything acute.  Your hemoglobin is stable.  No sign of active anemia.  I have called the gynecology specialist who will see you next week.  Please follow-up with your appointment.  Thank you for trusting [place]. ?

## 2021-10-11 NOTE — ED Provider Notes (Signed)
?[place] ?Provider Note ? ? ?CSN: [id number] ?Arrival date & time: 10/11/21  1302 ? ?  ? ?History ? ?No chief complaint on file. ? ? ?Lorenna Azion Jalbert is a 61 y.o. female. ? ?HPI ? ?Patient is a 61 year old female with a history of hysterectomy in 2005, history of GI bleed, who presents to the emergency department for vaginal bleeding that started on 5/1.  She reports she was seen at urgent care on 5/3 and was discharged after unremarkable exam.  Patient reports she was scheduled with OB and has an appointment on 5/26.  However she present to the emergency department because of continual bleeding.  She report she does use 3-4 pads a day.  She does not soaked a pad however sees blood around the central aspect of the pad.  She denies associated headache or dizziness.  Denies weight loss or night sweats.  She does report dysuria and frequency.  Denies any hematuria.  Denies any hematemesis, hematochezia or melena.  No chest pain or shortness of breath.  Otherwise no other complaints. ? ? ? ?Home Medications ?Prior to Admission medications   ?Medication Sig Start Date End Date Taking? Authorizing Provider  ?acetaminophen (TYLENOL) 325 MG tablet Take 2 tablets (650 mg total) by mouth every 6 (six) hours as needed. 12/14/19   Darr, Edison Nasuti, PA-C  ?albuterol (VENTOLIN HFA) 108 (90 Base) MCG/ACT inhaler Inhale 2 puffs into the lungs every 6 (six) hours as needed for wheezing or shortness of breath (Cough). 07/26/21   Lynden Oxford Scales, PA-C  ?allopurinol (ZYLOPRIM) 300 MG tablet TAKE 1 TABLET BY MOUTH EVERY DAY 06/17/20 06/17/21  Nolene Ebbs, MD  ?cetirizine (ZYRTEC ALLERGY) 10 MG tablet Take 1 tablet (10 mg total) by mouth at bedtime. 07/26/21 10/24/21  Lynden Oxford Scales, PA-C  ?cholecalciferol (VITAMIN D) 1000 units tablet Take 1,000 Units by mouth daily.    [provider]  ?colchicine 0.6 MG tablet TAKE 1 TABLET BY MOUTH EVERY DAY AS NEEDED FOR GOUT FLARE UP  06/17/20 06/17/21  Nolene Ebbs, MD  ?ferrous sulfate 325 (65 FE) MG tablet Take 1 tablet (325 mg total) by mouth 2 (two) times daily with a meal. ?Patient taking differently: Take 325 mg by mouth at bedtime. 03/20/16   Patrecia Pour, MD  ?fluticasone (FLONASE) 50 MCG/ACT nasal spray Place 2 sprays into both nostrils daily. 07/26/21   Lynden Oxford Scales, PA-C  ?Fluticasone-Umeclidin-Vilant (TRELEGY ELLIPTA) 100-62.5-25 MCG/ACT AEPB Inhale 1 puff into the lungs in the morning. 07/26/21   Lynden Oxford Scales, PA-C  ?guaifenesin (HUMIBID E) 400 MG TABS tablet Take 1 tablet 3 times daily as needed for chest congestion and cough 07/26/21   Lynden Oxford Scales, PA-C  ?Menthol (CEPACOL SORE THROAT) 5.4 MG LOZG Use as directed 1 lozenge (5.4 mg total) in the mouth or throat every 2 (two) hours as needed. 12/14/19   Darr, Edison Nasuti, PA-C  ?montelukast (SINGULAIR) 10 MG tablet Take 1 tablet (10 mg total) by mouth at bedtime. 07/26/21 10/24/21  Lynden Oxford Scales, PA-C  ?pantoprazole (PROTONIX) 40 MG tablet TAKE 1 TABLET BY MOUTH EVERY DAY 06/17/20 06/17/21  Nolene Ebbs, MD  ?polyethylene glycol (MIRALAX / GLYCOLAX) packet Take 17 g by mouth daily as needed for mild constipation or moderate constipation. 07/06/17   Alma Friendly, MD  ?triamcinolone cream (KENALOG) 0.5 %  01/23/19   [provider]  ?Vitamin D, Ergocalciferol, (DRISDOL) 1.25 MG (50000 UNIT) CAPS capsule Take 50,000 Units by mouth once  a week. 12/15/20   [provider]  ?   ? ?Allergies    ?Feraheme [ferumoxytol]   ? ?Review of Systems   ?Review of Systems ? ?Physical Exam ?Updated Vital Signs ?BP 128/84   Pulse (!) 58   Temp 99 ?F (37.2 ?C) (Oral)   Resp 16   SpO2 99%  ?Physical Exam ?Vitals and nursing note reviewed.  ?Constitutional:   ?   General: She is not in acute distress. ?   Appearance: She is well-developed.  ?HENT:  ?   Head: Normocephalic and atraumatic.  ?Eyes:  ?   Conjunctiva/sclera: Conjunctivae normal.   ?Cardiovascular:  ?   Rate and Rhythm: Normal rate and regular rhythm.  ?   Heart sounds: No murmur heard. ?Pulmonary:  ?   Effort: Pulmonary effort is normal. No respiratory distress.  ?   Breath sounds: Normal breath sounds.  ?Abdominal:  ?   Palpations: Abdomen is soft.  ?   Tenderness: There is no abdominal tenderness.  ?Genitourinary: ?   General: Normal vulva.  ?   Pubic Area: No rash.   ?   Labia:     ?   Right: No rash.     ?   Left: No rash.   ?   Comments: No vaginal atrophy.  There is blood in the vaginal vault.  Unable to find area where active bleeding is.  Normal-appearing external vaginal ?Musculoskeletal:     ?   General: No swelling.  ?   Cervical back: Neck supple.  ?Skin: ?   General: Skin is warm and dry.  ?   Capillary Refill: Capillary refill takes less than 2 seconds.  ?Neurological:  ?   Mental Status: She is alert.  ?Psychiatric:     ?   Mood and Affect: Mood normal.  ? ? ?ED Results / Procedures / Treatments   ?Labs ?(all labs ordered are listed, but only abnormal results are displayed) ?Labs Reviewed  ?CBC - Abnormal; Notable for the following components:  ?    Result Value  ? WBC 3.2 (*)   ? All other components within normal limits  ?URINALYSIS, ROUTINE W REFLEX MICROSCOPIC - Abnormal; Notable for the following components:  ? Hgb urine dipstick LARGE (*)   ? RBC / HPF >50 (*)   ? Bacteria, UA RARE (*)   ? All other components within normal limits  ?URINE CULTURE  ?TYPE AND SCREEN  ? ? ?EKG ?None ? ?Radiology ?No results found. ? ?Procedures ?Procedures  ? ?Medications Ordered in ED ?Medications - No data to display ? ?ED Course/ Medical Decision Making/ A&P ?Clinical Course as of 10/11/21 2315  ?Sat Oct 11, 2021  ?1969 61 year old female postmenopausal here with vaginal bleeding going on for the last 5 days.  She has had a hysterectomy in the past and denies any trauma.  She went to urgent care and she has an appointment with gynecology in a couple of weeks.  She denies any pain.  She  does not think the blood is from her bladder or rectum.  Getting labs and pelvic exam. [MB]  ?  ?Clinical Course User Index ?[MB] Hayden Rasmussen, MD  ? ?                        ?Medical Decision Making ?Problems Addressed: ?Vaginal bleeding: acute illness or injury that poses a threat to life or bodily functions ? ?Amount and/or Complexity of Data Reviewed ?  Labs: ordered. Decision-making details documented in ED Course. ? ? ?Patient is 61 year old female present to the emergency department for vaginal bleeding.  She does have a history of hysterectomy that was done in 2005 per chart review.  Her physical exam as above.  Vital signs are stable.  No sign of hemodynamic instability.  Her presentation is most likely vaginal atrophy versus cystitis.  I have low suspicion for serious vaginal bleeding at this time.  Less concern for cancerous etiology as patient does have a hysterectomy history.  In addition she does not have B symptoms.  Her CBC shows reassuring hemoglobin.  There is no leukocytosis to indicate there is infectious etiology at this time.  Urinalysis shows with urine RBC but no leukocyte or nitrite.  No elevation of urine WBC.  At this point it is unclear the cause of her vaginal bleeding.  I have called the gynecology who will schedule an appointment next week.  Patient is happy about that plan.  I have discussed strict return precaution.  Recommend follow-up with PCP as well.  Patient understand and agrees with plan. ? ?Final Clinical Impression(s) / ED Diagnoses ?Final diagnoses:  ?Vaginal bleeding  ? ? ?Rx / DC Orders ?ED Discharge Orders   ? ? None  ? ?  ? ? ?  ?Donnamarie Poag, MD ?10/11/21 2315 ? ?  ?Hayden Rasmussen, MD ?10/12/21 [phone] ? ?

## 2021-10-11 NOTE — ED Triage Notes (Signed)
Patient complains of vaginal bleeding since 5/1, seen at [facility] and no diagnosis on 5/2. Reports that the bleedng ongoing with cramping ?

## 2021-10-12 LAB — URINE CULTURE

## 2021-10-27 ENCOUNTER — Other Ambulatory Visit (HOSPITAL_COMMUNITY)
Admission: RE | Admit: 2021-10-27 | Discharge: 2021-10-27 | Disposition: A | Discharge status: Home / Self Care | Payer: Medicare HMO | Source: Ambulatory Visit | Attending: Obstetrics and Gynecology | Admitting: Obstetrics and Gynecology

## 2021-10-27 ENCOUNTER — Ambulatory Visit: Payer: Medicare HMO | Admitting: Obstetrics and Gynecology

## 2021-10-27 ENCOUNTER — Encounter: Payer: Self-pay | Admitting: Obstetrics and Gynecology

## 2021-10-27 VITALS — BP 152/87 | HR 66 | Ht 67.0 in | Wt 275.0 lb

## 2021-10-27 DIAGNOSIS — N950 Postmenopausal bleeding: Secondary | ICD-10-CM

## 2021-10-27 NOTE — Progress Notes (Signed)
61 yo postmenopausal here as ED follow up for evaluation of vaginal bleeding. Patient has a history of a vaginal hysterectomy. She reports vaginal bleeding for 6 days in early May. Patient reports no further episodes of vaginal bleeding. She describes the vaginal bleeding as heavy enough to require a pad which she changed twice a day. She denies any pelvic pain or passage of clots. Patient denies being sexually active.   Past Medical History:  Diagnosis Date   Arthritis    "bones; joints; legs; ankles" (03/17/2016)   GERD (gastroesophageal reflux disease)    Gout    History of blood transfusion 06/2015; 08/2015; 03/17/2016   History of iron deficiency anemia    took iron supplements 06/2015-3/ 2017; stopped/dr's order (03/17/2016)   Iron deficiency anemia due to chronic blood loss 07/04/2017   Low hemoglobin 06/2015; 08/2015; 03/17/2016   Shortness of breath on exertion 06/03/2011   Symptomatic anemia 06/2015; 08/2015; 03/17/2016   Past Surgical History:  Procedure Laterality Date   CERVICAL CONIZATION W/BX  08/2004   COLONOSCOPY N/A 06/13/2015   Procedure: COLONOSCOPY;  Surgeon: Jeani Hawking, MD;  Location: [facility];  Service: Endoscopy;  Laterality: N/A;   ESOPHAGOGASTRODUODENOSCOPY N/A 06/13/2015   Procedure: ESOPHAGOGASTRODUODENOSCOPY (EGD)/Colonoscopy;  Surgeon: Jeani Hawking, MD;  Location: [facility];  Service: Endoscopy;  Laterality: N/A;   GIVENS CAPSULE STUDY N/A 06/14/2015   Procedure: GIVENS CAPSULE STUDY;  Surgeon: Jeani Hawking, MD;  Location: [facility];  Service: Endoscopy;  Laterality: N/A;   GIVENS CAPSULE STUDY N/A 03/19/2016   Procedure: GIVENS CAPSULE STUDY;  Surgeon: Jeani Hawking, MD;  Location: [facility] ENDOSCOPY;  Service: Endoscopy;  Laterality: N/A;   GIVENS CAPSULE STUDY N/A 07/05/2017   Procedure: GIVENS CAPSULE STUDY;  Surgeon: Jeani Hawking, MD;  Location: [facility] ENDOSCOPY;  Service: Endoscopy;  Laterality: N/A;   GIVENS CAPSULE STUDY N/A 02/15/2018   Procedure: GIVENS CAPSULE  STUDY;  Surgeon: Jeani Hawking, MD;  Location: [facility] ENDOSCOPY;  Service: Endoscopy;  Laterality: N/A;   VAGINAL HYSTERECTOMY  ~ 2005   Family History  Problem Relation Age of Onset   COPD Other    Healthy Mother    Allergies Father    Hernia Father    Social History   Tobacco Use   Smoking status: Never   Smokeless tobacco: Never  Vaping Use   Vaping Use: Never used  Substance Use Topics   Alcohol use: Yes    Comment: Occassional   Drug use: No   ROS See pertinent in HPI. All other systems reviewed and non contributory Blood pressure (!) 152/87, pulse 66, height 5\' 7"  (1.702 m), weight 275 lb (124.7 kg). GENERAL: Well-developed, well-nourished female in no acute distress.  ABDOMEN: Soft, nontender, nondistended. No organomegaly. PELVIC: Normal external female genitalia. Vagina is pale and atrophic.  Normal discharge. Vaginal vault is intact. No evidence of vaginal bleeding. No adnexal mass or tenderness. Chaperone present during the pelvic exam EXTREMITIES: No cyanosis, clubbing, or edema, 2+ distal pulses.  A/P 61 yo with postmenopausal vaginal bleeding - Pelvic ultrasound ordered - Vaginal bleeding likely secondary to vaginal atrophy - Vaginal swab collected to rule out yeast or BV infection - Patient will be contacted with results

## 2021-10-27 NOTE — Progress Notes (Signed)
61 y.o GYN presents for Postmenopausal vaginal bleeding from 05/01-02/2022.  Denies pain.

## 2021-10-28 LAB — CERVICOVAGINAL ANCILLARY ONLY
Bacterial Vaginitis (gardnerella): NEGATIVE
Candida Glabrata: NEGATIVE
Candida Vaginitis: NEGATIVE
Comment: NEGATIVE
Comment: NEGATIVE
Comment: NEGATIVE

## 2021-11-12 ENCOUNTER — Ambulatory Visit
Admission: RE | Admit: 2021-11-12 | Discharge: 2021-11-12 | Disposition: A | Discharge status: Home / Self Care | Payer: Medicare HMO | Source: Ambulatory Visit | Attending: Obstetrics and Gynecology | Admitting: Obstetrics and Gynecology

## 2021-11-12 DIAGNOSIS — N950 Postmenopausal bleeding: Secondary | ICD-10-CM | POA: Insufficient documentation

## 2021-11-12 MED ORDER — MISOPROSTOL 200 MCG PO TABS: ORAL_TABLET | ORAL | 1 refills | Status: AC

## 2021-11-12 NOTE — Addendum Note (Signed)
Addended by: Catalina Antigua on: 11/12/2021 12:17 PM   Modules accepted: Orders

## 2021-11-13 ENCOUNTER — Other Ambulatory Visit (HOSPITAL_COMMUNITY): Payer: Self-pay

## 2021-11-17 ENCOUNTER — Other Ambulatory Visit: Payer: Self-pay | Admitting: Internal Medicine

## 2021-11-18 LAB — URINE CULTURE
MICRO NUMBER:: 13513362
SPECIMEN QUALITY:: ADEQUATE

## 2022-01-19 ENCOUNTER — Ambulatory Visit (INDEPENDENT_AMBULATORY_CARE_PROVIDER_SITE_OTHER): Payer: Medicare HMO | Admitting: Obstetrics and Gynecology

## 2022-01-19 ENCOUNTER — Encounter: Payer: Self-pay | Admitting: Obstetrics and Gynecology

## 2022-01-19 ENCOUNTER — Other Ambulatory Visit (HOSPITAL_COMMUNITY)
Admission: RE | Admit: 2022-01-19 | Discharge: 2022-01-19 | Disposition: A | Discharge status: Home / Self Care | Payer: Medicare HMO | Source: Ambulatory Visit | Attending: Obstetrics and Gynecology | Admitting: Obstetrics and Gynecology

## 2022-01-19 VITALS — BP 121/78 | HR 61 | Wt 276.0 lb

## 2022-01-19 DIAGNOSIS — N950 Postmenopausal bleeding: Secondary | ICD-10-CM | POA: Diagnosis present

## 2022-01-19 DIAGNOSIS — R87613 High grade squamous intraepithelial lesion on cytologic smear of cervix (HGSIL): Secondary | ICD-10-CM | POA: Diagnosis not present

## 2022-01-19 DIAGNOSIS — Z124 Encounter for screening for malignant neoplasm of cervix: Secondary | ICD-10-CM

## 2022-01-19 DIAGNOSIS — Z01411 Encounter for gynecological examination (general) (routine) with abnormal findings: Secondary | ICD-10-CM | POA: Insufficient documentation

## 2022-01-19 DIAGNOSIS — A630 Anogenital (venereal) warts: Secondary | ICD-10-CM | POA: Diagnosis not present

## 2022-01-19 DIAGNOSIS — Z113 Encounter for screening for infections with a predominantly sexual mode of transmission: Secondary | ICD-10-CM | POA: Diagnosis not present

## 2022-01-19 DIAGNOSIS — Z1151 Encounter for screening for human papillomavirus (HPV): Secondary | ICD-10-CM | POA: Insufficient documentation

## 2022-01-19 NOTE — Progress Notes (Signed)
61 yo postmenopausal with recent history of postmenopausal vaginal bleeding and thickened endometrium on recent pelvic ultrasound. Patient was under the impression that she had a hysterectomy in 2005 and was informed to find out that she did not. She reports some persistent vaginal bleeding since her last visit in May. She denies any pelvic pain.   Past Medical History:  Diagnosis Date   Arthritis    "bones; joints; legs; ankles" (03/17/2016)   GERD (gastroesophageal reflux disease)    Gout    History of blood transfusion 06/2015; 08/2015; 03/17/2016   History of iron deficiency anemia    took iron supplements 06/2015-3/ 2017; stopped/dr's order (03/17/2016)   Iron deficiency anemia due to chronic blood loss 07/04/2017   Low hemoglobin 06/2015; 08/2015; 03/17/2016   Shortness of breath on exertion 06/03/2011   Symptomatic anemia 06/2015; 08/2015; 03/17/2016   Past Surgical History:  Procedure Laterality Date   CERVICAL CONIZATION W/BX  08/2004   COLONOSCOPY N/A 06/13/2015   Procedure: COLONOSCOPY;  Surgeon: Jeani Hawking, MD;  Location: [facility] ENDOSCOPY;  Service: Endoscopy;  Laterality: N/A;   ESOPHAGOGASTRODUODENOSCOPY N/A 06/13/2015   Procedure: ESOPHAGOGASTRODUODENOSCOPY (EGD)/Colonoscopy;  Surgeon: Jeani Hawking, MD;  Location: [facility] ENDOSCOPY;  Service: Endoscopy;  Laterality: N/A;   GIVENS CAPSULE STUDY N/A 06/14/2015   Procedure: GIVENS CAPSULE STUDY;  Surgeon: Jeani Hawking, MD;  Location: [facility] ENDOSCOPY;  Service: Endoscopy;  Laterality: N/A;   GIVENS CAPSULE STUDY N/A 03/19/2016   Procedure: GIVENS CAPSULE STUDY;  Surgeon: Jeani Hawking, MD;  Location: [facility] ENDOSCOPY;  Service: Endoscopy;  Laterality: N/A;   GIVENS CAPSULE STUDY N/A 07/05/2017   Procedure: GIVENS CAPSULE STUDY;  Surgeon: Jeani Hawking, MD;  Location: [facility] ENDOSCOPY;  Service: Endoscopy;  Laterality: N/A;   GIVENS CAPSULE STUDY N/A 02/15/2018   Procedure: GIVENS CAPSULE STUDY;  Surgeon: Jeani Hawking, MD;  Location: [facility] ENDOSCOPY;  Service:  Endoscopy;  Laterality: N/A;   VAGINAL HYSTERECTOMY  ~ 2005   Family History  Problem Relation Age of Onset   COPD Other    Healthy Mother    Allergies Father    Hernia Father    Social History   Tobacco Use   Smoking status: Never   Smokeless tobacco: Never  Vaping Use   Vaping Use: Never used  Substance Use Topics   Alcohol use: Yes    Comment: Occassional   Drug use: No   ROS See pertinent in HPI. All other systems reviewed and non contributory Blood pressure 121/78, pulse 61, weight 276 lb (125.2 kg).  GENERAL: Well-developed, well-nourished female in no acute distress.  ABDOMEN: Soft, nontender, nondistended. No organomegaly. PELVIC: Normal external female genitalia. Vagina is pale and atrophic.  Normal discharge. Atrophic cervix. Uterus is difficult to palpate secondary to body habitus. No adnexal mass or tenderness. Chaperone present during the pelvic exam EXTREMITIES: No cyanosis, clubbing, or edema, 2+ distal pulses.  US PELVIC COMPLETE WITH TRANSVAGINAL  Result Date: 11/12/2021 CLINICAL DATA:  Postmenopausal vaginal bleeding EXAM: TRANSABDOMINAL AND TRANSVAGINAL ULTRASOUND OF PELVIS TECHNIQUE: Both transabdominal and transvaginal ultrasound examinations of the pelvis were performed. Transabdominal technique was performed for global imaging of the pelvis including uterus, ovaries, adnexal regions, and pelvic cul-de-sac. It was necessary to proceed with endovaginal exam following the transabdominal exam to visualize the uterus, endometrium, and ovaries. COMPARISON:  None FINDINGS: Uterus Measurements: 7.6 x 5.6 x 6.1 cm = volume: 135 mL. Anteverted. Heterogeneous myometrium. No focal mass. Endometrium Thickness: 15 mm. Thickened, heterogeneous. No discrete mass or fluid. Right ovary Measurements: 2.3  x 1.3 x 2.1 cm = volume: 3.2 mL. Normal morphology without mass Left ovary Measurements: 3.8 x 1.4 x 2.5 cm = volume: 7.1 mL. Normal morphology without mass Other findings No free  pelvic fluid.  No adnexal masses. IMPRESSION: Thickened endometrial complex 15 mm thick, abnormal for postmenopausal patient with bleeding; In the setting of post-menopausal bleeding, endometrial sampling is indicated to exclude carcinoma. If results are benign, sonohysterogram should be considered for focal lesion work-up. (Ref: Radiological Reasoning: Algorithmic Workup of Abnormal Vaginal Bleeding with Endovaginal Sonography and Sonohysterography. AJR 2008; 161:W96-04) These results will be called to the ordering clinician or representative by the Radiologist Assistant, and communication documented in the PACS or [employer]. Electronically Signed   By: Ulyses Southward M.D.   On: 11/12/2021 11:55     A/P 61 yo with postmenopausal vaginal bleeding - pap smear collected - ENDOMETRIAL BIOPSY     The indications for endometrial biopsy were reviewed.   Risks of the biopsy including cramping, bleeding, infection, uterine perforation, inadequate specimen and need for additional procedures  were discussed. The patient states she understands and agrees to undergo procedure today. Consent was signed. Time out was performed. Urine HCG was negative. A sterile speculum was placed in the patient's vagina and the cervix was prepped with Betadine. A single-toothed tenaculum was placed on the anterior lip of the cervix to stabilize it. The uterine cavity was sounded to a depth of 7 cm using the uterine sound. The 3 mm pipelle was introduced into the endometrial cavity without difficulty, 2 passes were made.  A  moderate amount of tissue was  sent to pathology. The instruments were removed from the patient's vagina. Minimal bleeding from the cervix was noted. The patient tolerated the procedure well.  Routine post-procedure instructions were given to the patient. The patient will follow up in two weeks to review the results and for further management.

## 2022-01-19 NOTE — Progress Notes (Signed)
Pt states she has some bleeding last week - lasting a couple of days.

## 2022-01-20 LAB — SURGICAL PATHOLOGY

## 2022-01-21 MED ORDER — MEGESTROL ACETATE 40 MG PO TABS: 40.0000 mg | ORAL_TABLET | Freq: Every day | ORAL | 5 refills | Status: AC

## 2022-01-21 NOTE — Addendum Note (Signed)
Addended by: Catalina Antigua on: 01/21/2022 10:42 AM   Modules accepted: Orders

## 2022-01-27 LAB — CYTOLOGY - PAP
Comment: NEGATIVE
Comment: NEGATIVE
Comment: NEGATIVE
HPV 16: NEGATIVE
HPV 18 / 45: NEGATIVE
High risk HPV: POSITIVE — AB

## 2022-01-29 ENCOUNTER — Telehealth: Payer: Self-pay | Admitting: Emergency Medicine

## 2022-01-29 NOTE — Telephone Encounter (Signed)
TC to patient to discuss results.  Scheduled for Colpo

## 2022-02-02 ENCOUNTER — Encounter: Payer: Self-pay | Admitting: Obstetrics and Gynecology

## 2022-02-02 ENCOUNTER — Other Ambulatory Visit (HOSPITAL_COMMUNITY)
Admission: RE | Admit: 2022-02-02 | Discharge: 2022-02-02 | Disposition: A | Discharge status: Home / Self Care | Payer: Medicare HMO | Source: Ambulatory Visit | Attending: Obstetrics and Gynecology | Admitting: Obstetrics and Gynecology

## 2022-02-02 ENCOUNTER — Ambulatory Visit (INDEPENDENT_AMBULATORY_CARE_PROVIDER_SITE_OTHER): Payer: Medicare HMO | Admitting: Obstetrics and Gynecology

## 2022-02-02 VITALS — BP 149/85 | HR 72 | Wt 271.0 lb

## 2022-02-02 DIAGNOSIS — R87612 Low grade squamous intraepithelial lesion on cytologic smear of cervix (LGSIL): Secondary | ICD-10-CM | POA: Diagnosis present

## 2022-02-02 DIAGNOSIS — R87810 Cervical high risk human papillomavirus (HPV) DNA test positive: Secondary | ICD-10-CM | POA: Diagnosis not present

## 2022-02-02 NOTE — Progress Notes (Signed)
61 yo P6 with LGSIL on 01/2022 pap smear here for colposcopy  Patient given informed consent, signed copy in the chart, time out was performed.  Placed in lithotomy position. Cervix viewed with speculum and colposcope after application of acetic acid.   Colposcopy adequate?  yes Acetowhite lesions?8 o'clock Punctation?no Mosaicism?  no Abnormal vasculature?  no Biopsies?yes 8 o'clock ECC? yes  COMMENTS: Patient was given post procedure instructions.  She will return in 2 weeks for results.  Catalina Antigua, MD

## 2022-02-04 LAB — SURGICAL PATHOLOGY

## 2022-02-11 NOTE — Progress Notes (Signed)
Pt advised of colpo results and need for repeat Pap in one year. All questions answered. Pt verbalized understanding.

## 2022-02-28 ENCOUNTER — Emergency Department (HOSPITAL_COMMUNITY)
Admission: EM | Admit: 2022-02-28 | Discharge: 2022-02-28 | Disposition: A | Discharge status: Home / Self Care | Payer: Medicare HMO | Attending: Emergency Medicine | Admitting: Emergency Medicine

## 2022-02-28 ENCOUNTER — Other Ambulatory Visit: Payer: Self-pay

## 2022-02-28 ENCOUNTER — Encounter (HOSPITAL_COMMUNITY): Payer: Self-pay

## 2022-02-28 DIAGNOSIS — R739 Hyperglycemia, unspecified: Secondary | ICD-10-CM | POA: Insufficient documentation

## 2022-02-28 DIAGNOSIS — Z91010 Allergy to peanuts: Secondary | ICD-10-CM | POA: Insufficient documentation

## 2022-02-28 DIAGNOSIS — N939 Abnormal uterine and vaginal bleeding, unspecified: Secondary | ICD-10-CM

## 2022-02-28 LAB — CBC
HCT: 38.2 % (ref 36.0–46.0)
Hemoglobin: 12.3 g/dL (ref 12.0–15.0)
MCH: 30.1 pg (ref 26.0–34.0)
MCHC: 32.2 g/dL (ref 30.0–36.0)
MCV: 93.6 fL (ref 80.0–100.0)
Platelets: 232 10*3/uL (ref 150–400)
RBC: 4.08 MIL/uL (ref 3.87–5.11)
RDW: 13.2 % (ref 11.5–15.5)
WBC: 7.2 10*3/uL (ref 4.0–10.5)
nRBC: 0 % (ref 0.0–0.2)

## 2022-02-28 LAB — BASIC METABOLIC PANEL
Anion gap: 9 (ref 5–15)
BUN: 13 mg/dL (ref 8–23)
CO2: 24 mmol/L (ref 22–32)
Calcium: 8.8 mg/dL — ABNORMAL LOW (ref 8.9–10.3)
Chloride: 105 mmol/L (ref 98–111)
Creatinine, Ser: 0.74 mg/dL (ref 0.44–1.00)
GFR, Estimated: >60 mL/min (ref >60)
Glucose, Bld: 130 mg/dL — ABNORMAL HIGH (ref 70–99)
Potassium: 3.5 mmol/L (ref 3.5–5.1)
Sodium: 138 mmol/L (ref 135–145)

## 2022-02-28 NOTE — Discharge Instructions (Addendum)
It was a pleasure taking care of you today. As discussed, your hemoglobin was normal today. Continue taking your medication as prescribed by your OBGYN. Call Dr. Domenic Schwab office on Monday to schedule an appointment for further evaluation. Return to the ER for new or worsening symptoms.

## 2022-02-28 NOTE — ED Triage Notes (Signed)
Pt reports vaginal bleeding since the 13th of this month States she has had this happen recently. Was told by her GYN to come to the ED if symptoms returned. PT has been taking megestrol to help with the bleeding. Pt denies any associated symptoms.

## 2022-02-28 NOTE — ED Provider Notes (Signed)
[facility] EMERGENCY DEPARTMENT Provider Note   CSN: [id number] Arrival date & time: 02/28/22  1510     History  Chief Complaint  Patient presents with   Vaginal Bleeding    Emily Lexianna Holland is a 61 y.o. female with a past medical history significant for iron deficiency anemia, GERD, history of GI bleed who presents to the ED due to vaginal bleeding for the past 10 days.  Patient is postmenopausal.  Patient admits to 10 days of vaginal bleeding which worsened today.  She has changed her pad 4 times today already.  Denies passing clots.  Denies shortness of breath, lightheadedness, dizziness.  Patient has been taking Megace for the past 10 days and increased to 2 pills 6 days ago.  Patient follows Dr. Lutricia Horsfall with OB/GYN. She had a pap smear and colposcopy performed 1 month ago that showed LGSIL. Dr. Lutricia Horsfall recommended repeat pap smear in 1 year; however was told to report to the ED for any vaginal bleeding per the patient. No vaginal discharge. Endorses some lower abdominal cramping. No concern for STIs. Denies fever and chills.   History obtained from patient and past medical records. No interpreter used during encounter.       Home Medications Prior to Admission medications   Medication Sig Start Date End Date Taking? Authorizing Provider  acetaminophen (TYLENOL) 325 MG tablet Take 2 tablets (650 mg total) by mouth every 6 (six) hours as needed. 12/14/19   Darr, Edison Nasuti, PA-C  albuterol (VENTOLIN HFA) 108 (90 Base) MCG/ACT inhaler Inhale 2 puffs into the lungs every 6 (six) hours as needed for wheezing or shortness of breath (Cough). 07/26/21   Lynden Oxford Scales, PA-C  allopurinol (ZYLOPRIM) 300 MG tablet TAKE 1 TABLET BY MOUTH EVERY DAY 06/17/20 06/17/21  Nolene Ebbs, MD  cetirizine (ZYRTEC ALLERGY) 10 MG tablet Take 1 tablet (10 mg total) by mouth at bedtime. 07/26/21 10/24/21  Lynden Oxford Scales, PA-C  cholecalciferol (VITAMIN D) 1000 units tablet Take  1,000 Units by mouth daily.    [provider]  colchicine 0.6 MG tablet TAKE 1 TABLET BY MOUTH EVERY DAY AS NEEDED FOR GOUT FLARE UP 06/17/20 06/17/21  Nolene Ebbs, MD  ferrous sulfate 325 (65 FE) MG tablet Take 1 tablet (325 mg total) by mouth 2 (two) times daily with a meal. Patient taking differently: Take 325 mg by mouth at bedtime. 03/20/16   Patrecia Pour, MD  fluticasone (FLONASE) 50 MCG/ACT nasal spray Place 2 sprays into both nostrils daily. 07/26/21   Lynden Oxford Scales, PA-C  Fluticasone-Umeclidin-Vilant (TRELEGY ELLIPTA) 100-62.5-25 MCG/ACT AEPB Inhale 1 puff into the lungs in the morning. 07/26/21   Lynden Oxford Scales, PA-C  guaifenesin (HUMIBID E) 400 MG TABS tablet Take 1 tablet 3 times daily as needed for chest congestion and cough 07/26/21   Lynden Oxford Scales, PA-C  megestrol (MEGACE) 40 MG tablet Take 1 tablet (40 mg total) by mouth daily. Can increase to two tablets twice a day in the event of heavy bleeding 01/21/22   Constant, Peggy, MD  Menthol (CEPACOL SORE THROAT) 5.4 MG LOZG Use as directed 1 lozenge (5.4 mg total) in the mouth or throat every 2 (two) hours as needed. 12/14/19   Darr, Edison Nasuti, PA-C  misoprostol (CYTOTEC) 200 MCG tablet Insert four tablets vaginally the night prior to your appointment 11/12/21   Constant, Vickii Chafe, MD  montelukast (SINGULAIR) 10 MG tablet Take 1 tablet (10 mg total) by mouth at bedtime. 07/26/21 10/24/21  Lilia Pro,  Lindsay Scales, PA-C  pantoprazole (PROTONIX) 40 MG tablet TAKE 1 TABLET BY MOUTH EVERY DAY 06/17/20 06/17/21  Nolene Ebbs, MD  polyethylene glycol (MIRALAX / GLYCOLAX) packet Take 17 g by mouth daily as needed for mild constipation or moderate constipation. 07/06/17   Alma Friendly, MD  triamcinolone cream (KENALOG) 0.5 %  01/23/19   [provider]  Vitamin D, Ergocalciferol, (DRISDOL) 1.25 MG (50000 UNIT) CAPS capsule Take 50,000 Units by mouth once a week. 12/15/20   [provider]      Allergies     Peanut (diagnostic) and Feraheme [ferumoxytol]    Review of Systems   Review of Systems  Constitutional:  Negative for chills and fever.  Respiratory:  Negative for shortness of breath.   Cardiovascular:  Negative for chest pain.  Gastrointestinal:  Positive for abdominal pain.  Genitourinary:  Positive for vaginal bleeding. Negative for dysuria.  Neurological:  Negative for dizziness.  All other systems reviewed and are negative.   Physical Exam Updated Vital Signs BP (!) 146/75 (BP Location: Right Arm)   Pulse 65   Temp 99.5 F (37.5 C) (Oral)   Resp 16   Ht 5\' 6"  (1.676 m)   Wt 122.5 kg   SpO2 99%   BMI 43.58 kg/m  Physical Exam Vitals and nursing note reviewed.  Constitutional:      General: She is not in acute distress.    Appearance: She is not ill-appearing.  HENT:     Head: Normocephalic.  Eyes:     Pupils: Pupils are equal, round, and reactive to light.  Cardiovascular:     Rate and Rhythm: Normal rate and regular rhythm.     Pulses: Normal pulses.     Heart sounds: Normal heart sounds. No murmur heard.    No friction rub. No gallop.  Pulmonary:     Effort: Pulmonary effort is normal.     Breath sounds: Normal breath sounds.  Abdominal:     General: Abdomen is flat. There is no distension.     Palpations: Abdomen is soft.     Tenderness: There is no abdominal tenderness. There is no guarding or rebound.  Musculoskeletal:        General: Normal range of motion.     Cervical back: Neck supple.  Skin:    General: Skin is warm and dry.  Neurological:     General: No focal deficit present.     Mental Status: She is alert.  Psychiatric:        Mood and Affect: Mood normal.        Behavior: Behavior normal.     ED Results / Procedures / Treatments   Labs (all labs ordered are listed, but only abnormal results are displayed) Labs Reviewed  BASIC METABOLIC PANEL - Abnormal; Notable for the following components:      Result Value   Glucose, Bld 130  (*)    Calcium 8.8 (*)    All other components within normal limits  CBC    EKG None  Radiology No results found.  Procedures Procedures    Medications Ordered in ED Medications - No data to display  ED Course/ Medical Decision Making/ A&P                           Medical Decision Making Amount and/or Complexity of Data Reviewed Independent Historian: spouse    Details: Husband at bedside provided history External Data Reviewed:  notes.    Details: OBGYN notes Labs: ordered. Decision-making details documented in ED Course.  Risk OTC drugs. Prescription drug management.   This patient presents to the ED for concern of vaginal bleeding, this involves an extensive number of treatment options, and is a complaint that carries with it a high risk of complications and morbidity.  The differential diagnosis includes fibroids, cancer, etc  61 year old female presents to the ED due to 10 days of vaginal bleeding which increased today.  Patient has been taking Megace for 10 days and increase to 2 pills daily for the past 6 days with no improvement in bleeding.  She notes she has changed her pad 4 times today. No clots. Patient had a colposcopy and Pap smear performed 1 month ago by Dr. Lutricia Horsfall which showed LGSIL and recommended repeat Pap smear in 1 year.  No fever or chills.  No injury.  Upon arrival, stable vitals.  Patient in no acute distress.  Abdomen soft, nondistended, nontender.  Low suspicion for acute abdomen.  Routine labs ordered to rule out anemia.  Added UA to rule out acute cystitis.  Will perform pelvic exam and obtain wet prep and gonorrhea/chlamydia cultures.   CBC unremarkable.  No leukocytosis and normal hemoglobin.  BMP reassuring.  Mild hyperglycemia at 130.  No anion gap.  Normal renal function.  No major electrolyte derangements.  5:13 PM informed by attending, Dr. Billy Fischer that patient would prefer to follow-up with OBGYN rather than further workup/pelvic  exam. Patient hemodynamically stable.  Normal hemoglobin here in the ED.  Advised patient to follow-up with OB/GYN on Monday for further evaluation.  Continue taking Megace as previously prescribed. Strict ED precautions discussed with patient. Patient states understanding and agrees to plan. Patient discharged home in no acute distress and stable vitals        Final Clinical Impression(s) / ED Diagnoses Final diagnoses:  Vaginal bleeding    Rx / DC Orders ED Discharge Orders     None         Karie Kirks 02/28/22 1721    Gareth Morgan, MD 03/02/22 2231

## 2022-04-06 ENCOUNTER — Encounter: Payer: Medicare HMO | Admitting: Obstetrics and Gynecology

## 2022-05-11 ENCOUNTER — Other Ambulatory Visit: Payer: Self-pay | Admitting: Internal Medicine

## 2022-05-11 LAB — CBC
HCT: 34.3 % — ABNORMAL LOW (ref 35.0–45.0)
Hemoglobin: 11.3 g/dL — ABNORMAL LOW (ref 11.7–15.5)
MCH: 29.2 pg (ref 27.0–33.0)
MCHC: 32.9 g/dL (ref 32.0–36.0)
MCV: 88.6 fL (ref 80.0–100.0)
MPV: 11.7 fL (ref 7.5–12.5)
Platelets: 312 10*3/uL (ref 140–400)
RBC: 3.87 10*6/uL (ref 3.80–5.10)
RDW: 12.1 % (ref 11.0–15.0)
WBC: 5.5 10*3/uL (ref 3.8–10.8)

## 2022-05-11 LAB — EXTRA SPECIMEN

## 2022-07-24 ENCOUNTER — Other Ambulatory Visit (HOSPITAL_COMMUNITY): Payer: Self-pay

## 2022-12-15 ENCOUNTER — Other Ambulatory Visit: Payer: Self-pay | Admitting: Internal Medicine

## 2022-12-15 DIAGNOSIS — Z1231 Encounter for screening mammogram for malignant neoplasm of breast: Secondary | ICD-10-CM

## 2022-12-30 ENCOUNTER — Ambulatory Visit: Payer: Medicare HMO

## 2023-01-01 ENCOUNTER — Ambulatory Visit
Admission: RE | Admit: 2023-01-01 | Discharge: 2023-01-01 | Disposition: A | Discharge status: Home / Self Care | Payer: Medicare HMO | Source: Ambulatory Visit | Attending: Internal Medicine | Admitting: Internal Medicine

## 2023-01-01 DIAGNOSIS — Z1231 Encounter for screening mammogram for malignant neoplasm of breast: Secondary | ICD-10-CM

## 2023-02-22 ENCOUNTER — Encounter (HOSPITAL_COMMUNITY): Payer: Self-pay

## 2023-02-22 ENCOUNTER — Ambulatory Visit (HOSPITAL_COMMUNITY)
Admission: EM | Admit: 2023-02-22 | Discharge: 2023-02-22 | Disposition: A | Discharge status: Home / Self Care | Payer: Medicare HMO | Attending: Family Medicine | Admitting: Family Medicine

## 2023-02-22 DIAGNOSIS — L0291 Cutaneous abscess, unspecified: Secondary | ICD-10-CM

## 2023-02-22 MED ORDER — AMOXICILLIN-POT CLAVULANATE 875-125 MG PO TABS: 1.0000 | ORAL_TABLET | Freq: Two times a day (BID) | ORAL | 0 refills | Status: AC

## 2023-02-22 NOTE — ED Provider Notes (Signed)
[facility]    CSN: [id number] Arrival date & time: 02/22/23  1126      History   Chief Complaint Chief Complaint  Patient presents with   Abscess    HPI Emily Holland is a 62 y.o. female.    Abscess Here for a swollen spot that has been draining off and on for the last 2 weeks.  No fever or chills.  She is not allergic to anything    Past Medical History:  Diagnosis Date   Arthritis    "bones; joints; legs; ankles" (03/17/2016)   GERD (gastroesophageal reflux disease)    Gout    History of blood transfusion 06/2015; 08/2015; 03/17/2016   History of iron deficiency anemia    took iron supplements 06/2015-3/ 2017; stopped/dr's order (03/17/2016)   Iron deficiency anemia due to chronic blood loss 07/04/2017   Low hemoglobin 06/2015; 08/2015; 03/17/2016   Shortness of breath on exertion 06/03/2011   Symptomatic anemia 06/2015; 08/2015; 03/17/2016    Patient Active Problem List   Diagnosis Date Noted   Iron deficiency anemia due to chronic blood loss 04/22/2016   GERD (gastroesophageal reflux disease) 03/18/2016   Anemia 03/17/2016   Symptomatic anemia 03/17/2016   Gout 08/17/2015   Anemia, iron deficiency 08/17/2015   GI bleeding 06/15/2015   Acute blood loss anemia 06/11/2015   Hypoxemia 06/03/2011   Fever 06/03/2011   Obesity 06/03/2011    Past Surgical History:  Procedure Laterality Date   CERVICAL CONIZATION W/BX  08/2004   COLONOSCOPY N/A 06/13/2015   Procedure: COLONOSCOPY;  Surgeon: Jeani Hawking, MD;  Location: [facility];  Service: Endoscopy;  Laterality: N/A;   ESOPHAGOGASTRODUODENOSCOPY N/A 06/13/2015   Procedure: ESOPHAGOGASTRODUODENOSCOPY (EGD)/Colonoscopy;  Surgeon: Jeani Hawking, MD;  Location: [facility];  Service: Endoscopy;  Laterality: N/A;   GIVENS CAPSULE STUDY N/A 06/14/2015   Procedure: GIVENS CAPSULE STUDY;  Surgeon: Jeani Hawking, MD;  Location: [facility];  Service: Endoscopy;  Laterality: N/A;   GIVENS CAPSULE STUDY N/A  03/19/2016   Procedure: GIVENS CAPSULE STUDY;  Surgeon: Jeani Hawking, MD;  Location: [facility] ENDOSCOPY;  Service: Endoscopy;  Laterality: N/A;   GIVENS CAPSULE STUDY N/A 07/05/2017   Procedure: GIVENS CAPSULE STUDY;  Surgeon: Jeani Hawking, MD;  Location: [facility] ENDOSCOPY;  Service: Endoscopy;  Laterality: N/A;   GIVENS CAPSULE STUDY N/A 02/15/2018   Procedure: GIVENS CAPSULE STUDY;  Surgeon: Jeani Hawking, MD;  Location: [facility] ENDOSCOPY;  Service: Endoscopy;  Laterality: N/A;   VAGINAL HYSTERECTOMY  ~ 2005    OB History     Gravida  6   Para  0   Term  0   Preterm  0   AB  0   Living  6      SAB  0   IAB  0   Ectopic  0   Multiple      Live Births  6            Home Medications    Prior to Admission medications   Medication Sig Start Date End Date Taking? Authorizing Provider  acetaminophen (TYLENOL) 325 MG tablet Take 2 tablets (650 mg total) by mouth every 6 (six) hours as needed. 12/14/19  Yes Darr, Gerilyn Pilgrim, PA-C  albuterol (VENTOLIN HFA) 108 (90 Base) MCG/ACT inhaler Inhale 2 puffs into the lungs every 6 (six) hours as needed for wheezing or shortness of breath (Cough). 07/26/21  Yes Theadora Rama Scales, PA-C  amoxicillin-clavulanate (AUGMENTIN) 875-125 MG tablet Take 1 tablet by mouth 2 (  two) times daily for 7 days. 02/22/23 03/01/23 Yes Zenia Resides, MD  ferrous sulfate 325 (65 FE) MG tablet Take 1 tablet (325 mg total) by mouth 2 (two) times daily with a meal. Patient taking differently: Take 325 mg by mouth at bedtime. 03/20/16  Yes Tyrone Nine, MD  Fluticasone-Umeclidin-Vilant (TRELEGY ELLIPTA) 100-62.5-25 MCG/ACT AEPB Inhale 1 puff into the lungs in the morning. 07/26/21  Yes Theadora Rama Scales, PA-C  megestrol (MEGACE) 40 MG tablet Take 1 tablet (40 mg total) by mouth daily. Can increase to two tablets twice a day in the event of heavy bleeding 01/21/22  Yes Constant, Peggy, MD  polyethylene glycol (MIRALAX / GLYCOLAX) packet Take 17 g by mouth daily as needed  for mild constipation or moderate constipation. 07/06/17  Yes Briant Cedar, MD  allopurinol (ZYLOPRIM) 300 MG tablet TAKE 1 TABLET BY MOUTH EVERY DAY 06/17/20 06/17/21  Fleet Contras, MD  cetirizine (ZYRTEC ALLERGY) 10 MG tablet Take 1 tablet (10 mg total) by mouth at bedtime. 07/26/21 10/24/21  Theadora Rama Scales, PA-C  cholecalciferol (VITAMIN D) 1000 units tablet Take 1,000 Units by mouth daily.    [provider]  colchicine 0.6 MG tablet TAKE 1 TABLET BY MOUTH EVERY DAY AS NEEDED FOR GOUT FLARE UP 06/17/20 06/17/21  Fleet Contras, MD  fluticasone (FLONASE) 50 MCG/ACT nasal spray Place 2 sprays into both nostrils daily. 07/26/21   Theadora Rama Scales, PA-C  guaifenesin (HUMIBID E) 400 MG TABS tablet Take 1 tablet 3 times daily as needed for chest congestion and cough 07/26/21   Theadora Rama Scales, PA-C  Menthol (CEPACOL SORE THROAT) 5.4 MG LOZG Use as directed 1 lozenge (5.4 mg total) in the mouth or throat every 2 (two) hours as needed. 12/14/19   Darr, Gerilyn Pilgrim, PA-C  misoprostol (CYTOTEC) 200 MCG tablet Insert four tablets vaginally the night prior to your appointment 11/12/21   Constant, Gigi Gin, MD  montelukast (SINGULAIR) 10 MG tablet Take 1 tablet (10 mg total) by mouth at bedtime. 07/26/21 10/24/21  Theadora Rama Scales, PA-C  pantoprazole (PROTONIX) 40 MG tablet TAKE 1 TABLET BY MOUTH EVERY DAY 06/17/20 06/17/21  Fleet Contras, MD  triamcinolone cream (KENALOG) 0.5 %  01/23/19   [provider]  Vitamin D, Ergocalciferol, (DRISDOL) 1.25 MG (50000 UNIT) CAPS capsule Take 50,000 Units by mouth once a week. 12/15/20   [provider]    Family History Family History  Problem Relation Age of Onset   COPD Other    Healthy Mother    Allergies Father    Hernia Father     Social History Social History   Tobacco Use   Smoking status: Never   Smokeless tobacco: Never  Vaping Use   Vaping status: Never Used  Substance Use Topics   Alcohol use: Yes     Comment: Occassional   Drug use: No     Allergies   Peanut (diagnostic) and Feraheme [ferumoxytol]   Review of Systems Review of Systems   Physical Exam Triage Vital Signs ED Triage Vitals [02/22/23 1252]  Encounter Vitals Group     BP (!) 141/83     Systolic BP Percentile      Diastolic BP Percentile      Pulse Rate (!) 56     Resp 16     Temp 98.3 F (36.8 C)     Temp Source Oral     SpO2 95 %     Weight      Height  Head Circumference      Peak Flow      Pain Score      Pain Loc      Pain Education      Exclude from Growth Chart    No data found.  Updated Vital Signs BP (!) 141/83 (BP Location: Left Arm)   Pulse (!) 56   Temp 98.3 F (36.8 C) (Oral)   Resp 16   SpO2 95%   Visual Acuity Right Eye Distance:   Left Eye Distance:   Bilateral Distance:    Right Eye Near:   Left Eye Near:    Bilateral Near:     Physical Exam Vitals reviewed.  Constitutional:      General: She is not in acute distress.    Appearance: She is not ill-appearing, toxic-appearing or diaphoretic.  HENT:     Mouth/Throat:     Mouth: Mucous membranes are moist.  Skin:    Coloration: Skin is not pale.     Comments: At the upper pole of her gluteal crevice on the left there is an area of induration about 3 cm x 2 cm.  There are 2 spots of fluctuance with spots that are actually draining a tiny amount of pus.  There is mild tenderness.  Neurological:     General: No focal deficit present.     Mental Status: She is alert and oriented to person, place, and time.  Psychiatric:        Behavior: Behavior normal.      UC Treatments / Results  Labs (all labs ordered are listed, but only abnormal results are displayed) Labs Reviewed - No data to display  EKG   Radiology No results found.  Procedures Procedures (including critical care time)  Medications Ordered in UC Medications - No data to display  Initial Impression / Assessment and Plan / UC Course  I have  reviewed the triage vital signs and the nursing notes.  Pertinent labs & imaging results that were available during my care of the patient were reviewed by me and considered in my medical decision making (see chart for details).        Since it is already draining she does not need a drainage procedure today.  Augmentin is sent in for the infection.  I have asked her to follow-up with primary care about this issue  Final Clinical Impressions(s) / UC Diagnoses   Final diagnoses:  Abscess     Discharge Instructions      Take amoxicillin-clavulanate 875 mg--1 tab twice daily with food for 7 days  Warm sitz bath's 2 times daily until improved  Please follow-up with your primary care about this issue     ED Prescriptions     Medication Sig Dispense Auth. Provider   amoxicillin-clavulanate (AUGMENTIN) 875-125 MG tablet Take 1 tablet by mouth 2 (two) times daily for 7 days. 14 tablet Anik Wesch, Janace Aris, MD      PDMP not reviewed this encounter.   Zenia Resides, MD 02/22/23 1324

## 2023-02-22 NOTE — Discharge Instructions (Signed)
Take amoxicillin-clavulanate 875 mg--1 tab twice daily with food for 7 days  Warm sitz bath's 2 times daily until improved  Please follow-up with your primary care about this issue

## 2023-02-22 NOTE — ED Triage Notes (Signed)
Pt presents to the office for abscess on her buttocks x 2 weeks.

## 2023-04-14 ENCOUNTER — Other Ambulatory Visit: Payer: Self-pay

## 2023-05-06 IMAGING — US US PELVIS COMPLETE WITH TRANSVAGINAL
1 series · 15 of 25 positions shown · non-contrast
Comparison: None

CLINICAL DATA: Postmenopausal vaginal bleeding



[Series 1: us pelvis complete · 15 of 49 slices shown]
[im 1/49]
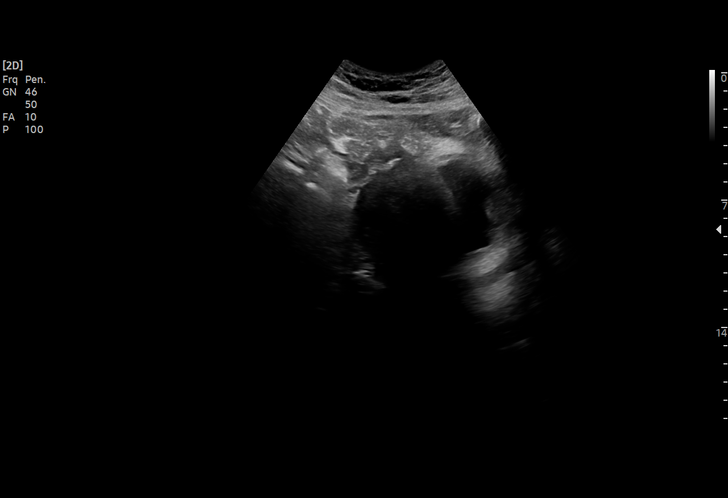
[im 5/49]
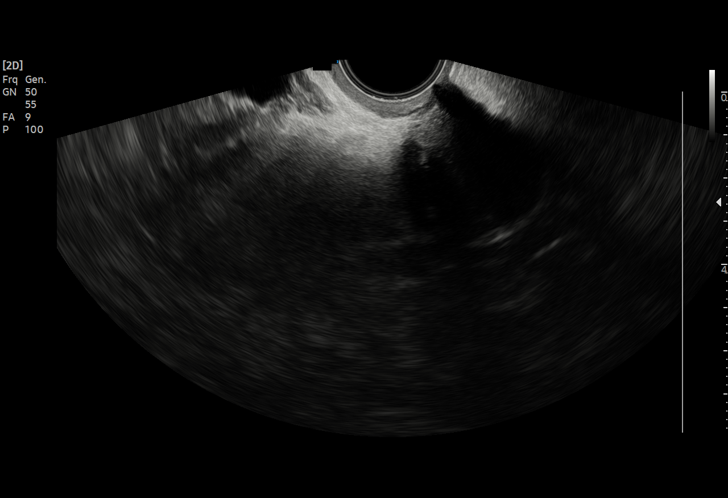
[im 9/49]
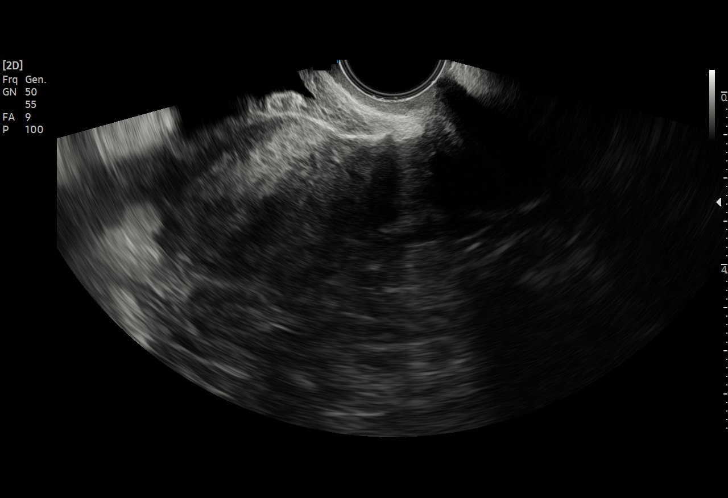
[im 11/49]
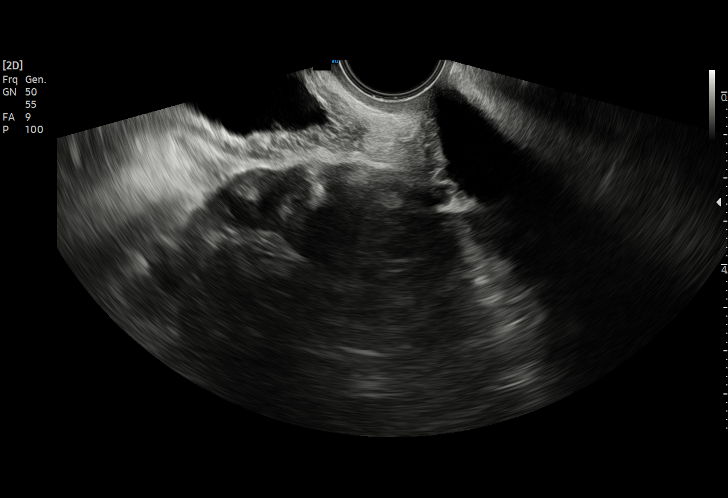
[im 15/49]
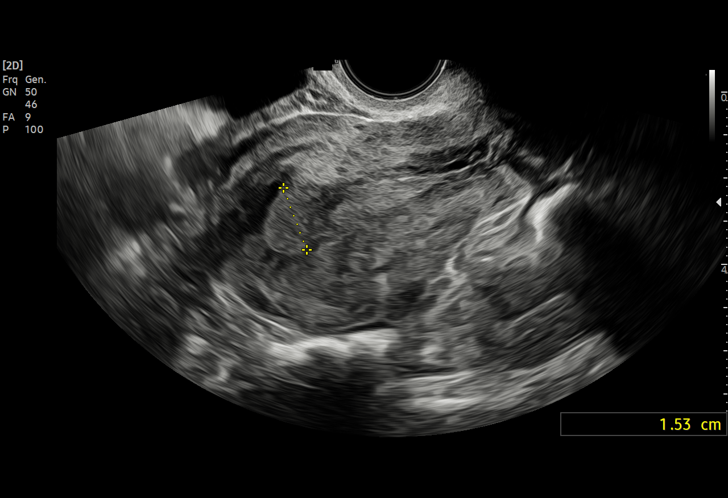
[im 19/49]
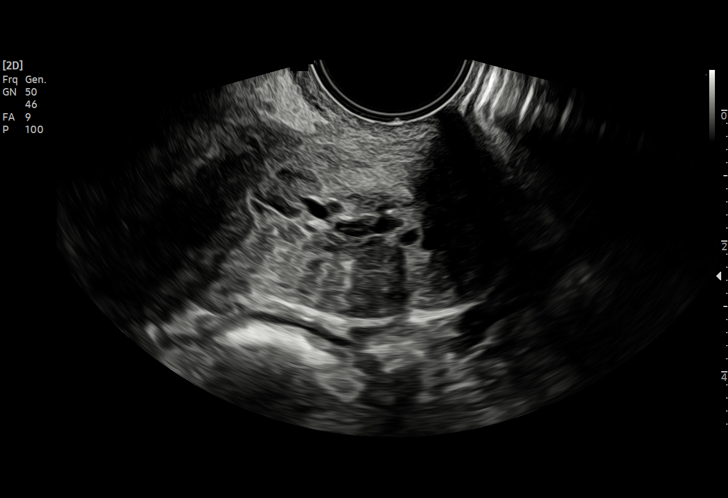
[im 21/49]
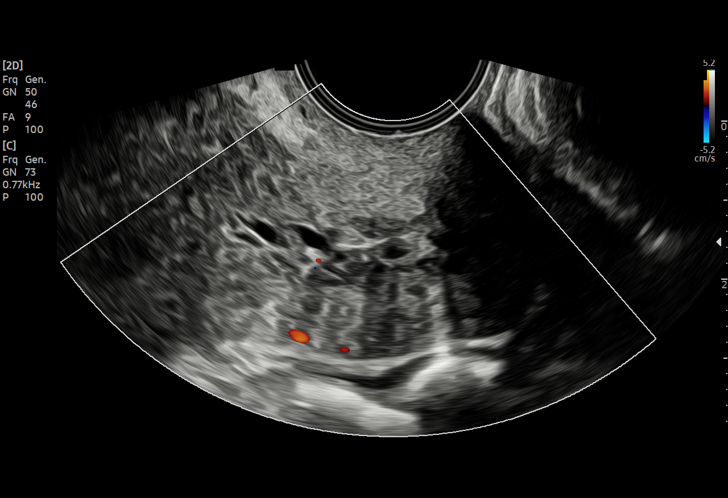
[im 25/49]
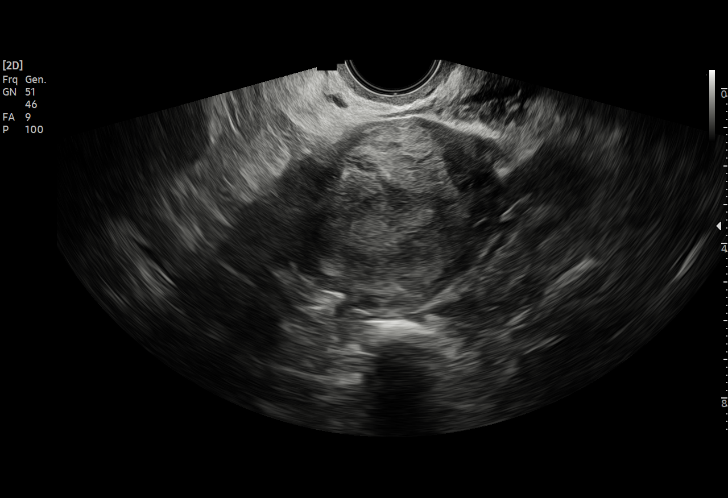
[im 29/49]
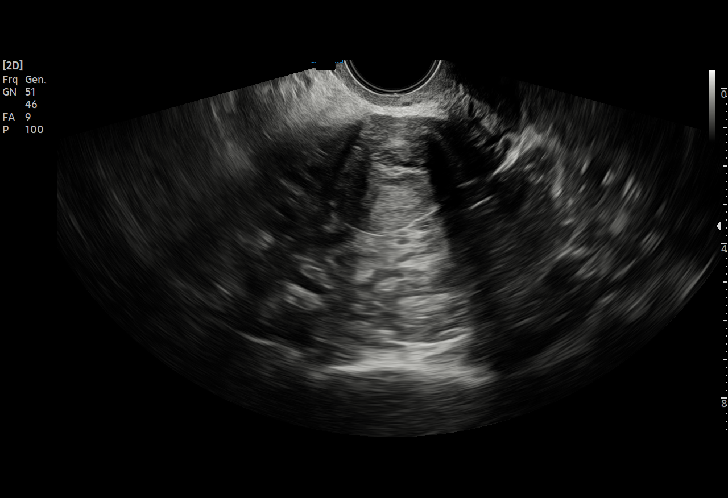
[im 31/49]
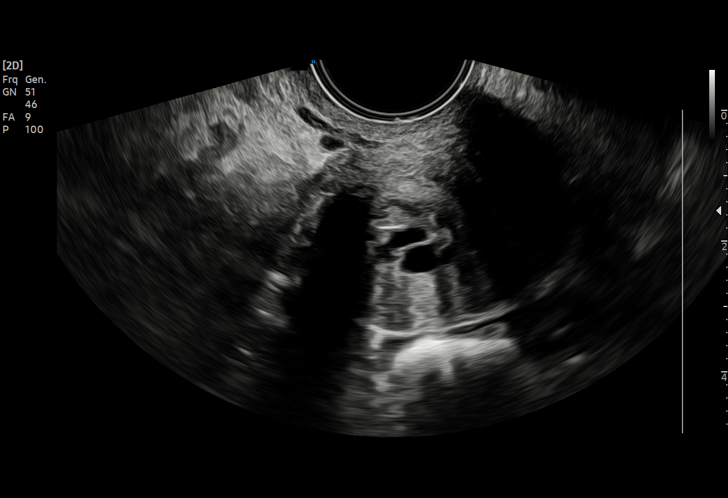
[im 35/49]
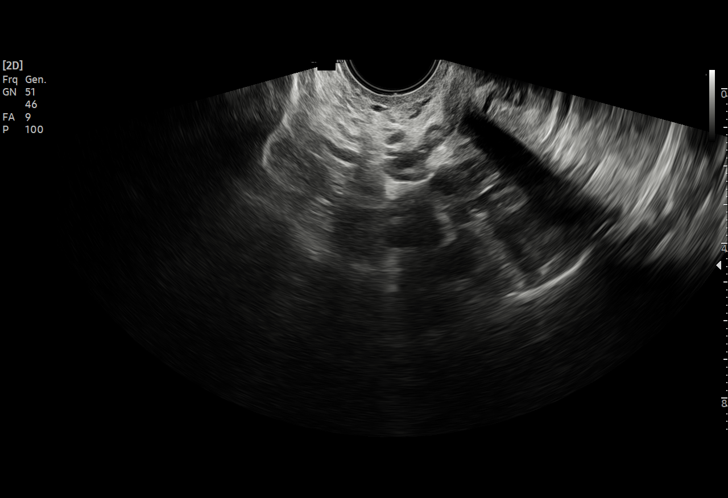
[im 39/49]
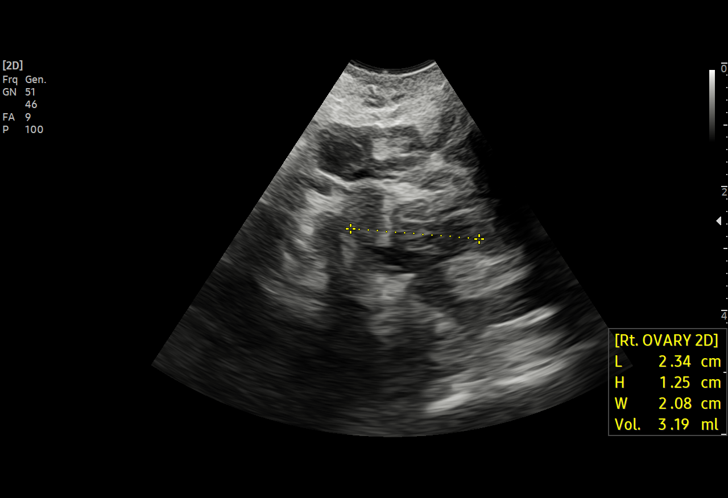
[im 41/49]
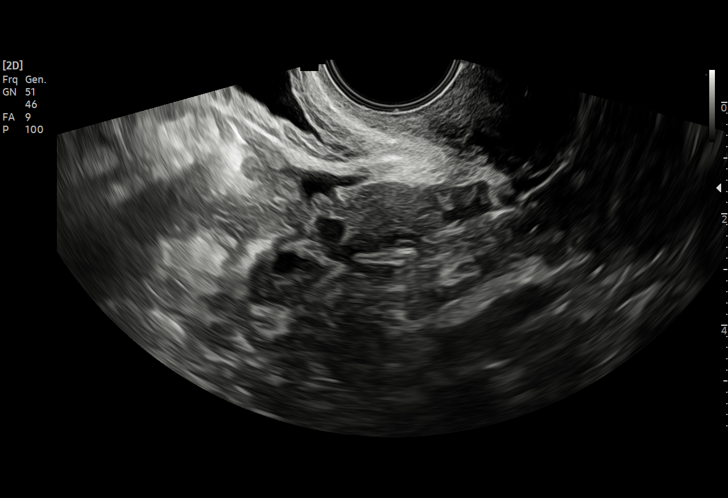
[im 45/49]
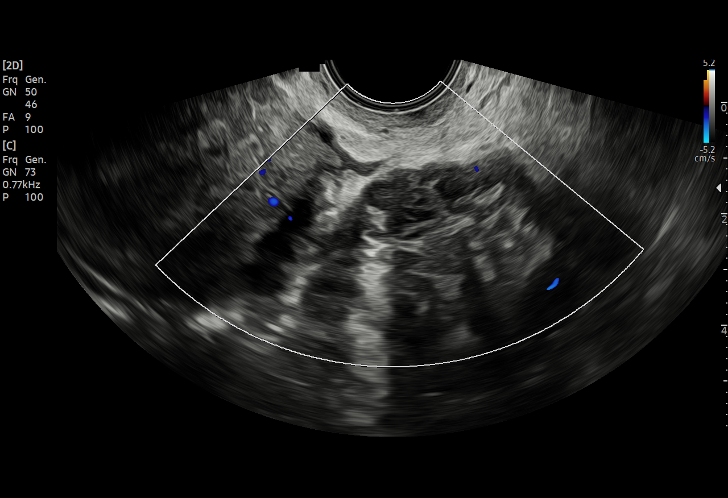
[im 49/49]
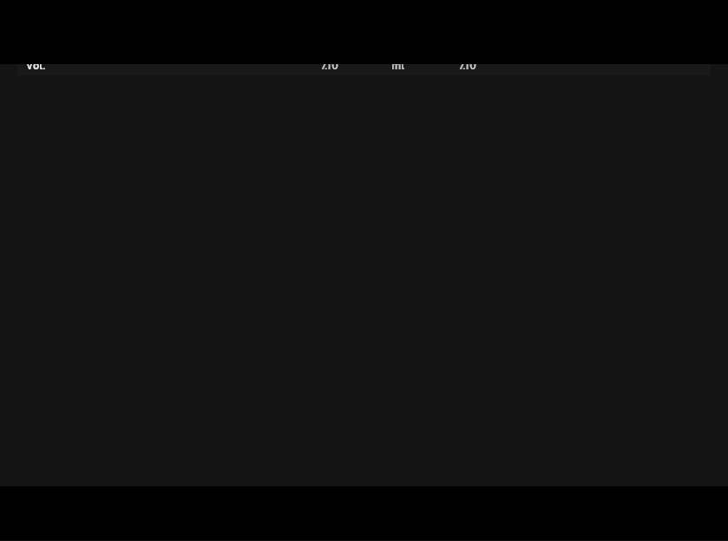

[15 of 25 positions shown; findings below may reference images not displayed]

FINDINGS: Uterus

Measurements: 7.6 x 5.6 x 6.1 cm = volume: 135 mL. Anteverted.
Heterogeneous myometrium. No focal mass.

Endometrium

Thickness: 15 mm. Thickened, heterogeneous. No discrete mass or
fluid.

Right ovary

Measurements: 2.3 x 1.3 x 2.1 cm = volume: 3.2 mL. Normal morphology
without mass

Left ovary

Measurements: 3.8 x 1.4 x 2.5 cm = volume: 7.1 mL. Normal morphology
without mass

Other findings

No free pelvic fluid.  No adnexal masses.
IMPRESSION: Thickened endometrial complex 15 mm thick, abnormal for
postmenopausal patient with bleeding; In the setting of
post-menopausal bleeding, endometrial sampling is indicated to
exclude carcinoma. If results are benign, sonohysterogram should be
considered for focal lesion work-up. (Ref: Radiological Reasoning:
Algorithmic Workup of Abnormal Vaginal Bleeding with Endovaginal
Sonography and Sonohysterography. AJR 1446; [id number]:[id number])

These results will be called to the ordering clinician or
representative by the Radiologist Assistant, and communication
documented in the PACS or [REDACTED].

## 2023-08-03 ENCOUNTER — Other Ambulatory Visit: Payer: Self-pay | Admitting: Internal Medicine

## 2023-08-03 DIAGNOSIS — Z1231 Encounter for screening mammogram for malignant neoplasm of breast: Secondary | ICD-10-CM

## 2024-01-04 ENCOUNTER — Ambulatory Visit
Admission: RE | Admit: 2024-01-04 | Discharge: 2024-01-04 | Disposition: A | Discharge status: Home / Self Care | Payer: Medicare HMO | Source: Ambulatory Visit | Attending: Internal Medicine | Admitting: Internal Medicine

## 2024-01-04 ENCOUNTER — Encounter: Payer: Self-pay | Admitting: Hematology

## 2024-01-04 DIAGNOSIS — Z1231 Encounter for screening mammogram for malignant neoplasm of breast: Secondary | ICD-10-CM
